# Patient Record
Sex: Female | Born: 1935 | Race: Black or African American | Hispanic: No | Smoking: Never smoker
Health system: Southern US, Community
[De-identification: ages and names within clinical notes are randomized; demographics above are authoritative.]

## PROBLEM LIST (undated history)

## (undated) DIAGNOSIS — G610 Guillain-Barre syndrome: Secondary | ICD-10-CM

## (undated) DIAGNOSIS — G6189 Other inflammatory polyneuropathies: Secondary | ICD-10-CM

## (undated) DIAGNOSIS — E559 Vitamin D deficiency, unspecified: Secondary | ICD-10-CM

## (undated) DIAGNOSIS — E785 Hyperlipidemia, unspecified: Secondary | ICD-10-CM

## (undated) DIAGNOSIS — I10 Essential (primary) hypertension: Secondary | ICD-10-CM

## (undated) DIAGNOSIS — G622 Polyneuropathy due to other toxic agents: Secondary | ICD-10-CM

## (undated) DIAGNOSIS — IMO0001 Reserved for inherently not codable concepts without codable children: Secondary | ICD-10-CM

## (undated) HISTORY — DX: Vitamin D deficiency, unspecified: E55.9

## (undated) HISTORY — DX: Reserved for inherently not codable concepts without codable children: IMO0001

## (undated) HISTORY — DX: Polyneuropathy due to other toxic agents: G62.2

## (undated) HISTORY — DX: Hyperlipidemia, unspecified: E78.5

## (undated) HISTORY — DX: Guillain-Barre syndrome: G61.0

## (undated) HISTORY — DX: Other inflammatory polyneuropathies: G61.89

---

## 1983-09-05 HISTORY — PX: TRACHEAL SURGERY: SHX1096

## 2011-08-14 ENCOUNTER — Other Ambulatory Visit: Payer: Self-pay | Admitting: Internal Medicine

## 2011-08-14 DIAGNOSIS — Z1231 Encounter for screening mammogram for malignant neoplasm of breast: Secondary | ICD-10-CM

## 2011-08-14 DIAGNOSIS — Z780 Asymptomatic menopausal state: Secondary | ICD-10-CM

## 2011-09-06 ENCOUNTER — Ambulatory Visit
Admission: RE | Admit: 2011-09-06 | Discharge: 2011-09-06 | Disposition: A | Discharge status: Home / Self Care | Payer: Medicare Other | Source: Ambulatory Visit | Attending: Internal Medicine | Admitting: Internal Medicine

## 2011-09-06 DIAGNOSIS — Z1231 Encounter for screening mammogram for malignant neoplasm of breast: Secondary | ICD-10-CM

## 2011-09-06 DIAGNOSIS — Z13820 Encounter for screening for osteoporosis: Secondary | ICD-10-CM | POA: Diagnosis not present

## 2011-09-06 DIAGNOSIS — Z780 Asymptomatic menopausal state: Secondary | ICD-10-CM

## 2011-09-14 ENCOUNTER — Other Ambulatory Visit: Payer: Self-pay | Admitting: Internal Medicine

## 2011-09-14 DIAGNOSIS — R928 Other abnormal and inconclusive findings on diagnostic imaging of breast: Secondary | ICD-10-CM

## 2011-09-20 ENCOUNTER — Ambulatory Visit
Admission: RE | Admit: 2011-09-20 | Discharge: 2011-09-20 | Disposition: A | Discharge status: Home / Self Care | Payer: Medicare Other | Source: Ambulatory Visit | Attending: Internal Medicine | Admitting: Internal Medicine

## 2011-09-20 DIAGNOSIS — R920 Mammographic microcalcification found on diagnostic imaging of breast: Secondary | ICD-10-CM | POA: Diagnosis not present

## 2011-09-20 DIAGNOSIS — R928 Other abnormal and inconclusive findings on diagnostic imaging of breast: Secondary | ICD-10-CM

## 2011-10-17 ENCOUNTER — Emergency Department (INDEPENDENT_AMBULATORY_CARE_PROVIDER_SITE_OTHER): Payer: Medicare Other

## 2011-10-17 ENCOUNTER — Emergency Department (INDEPENDENT_AMBULATORY_CARE_PROVIDER_SITE_OTHER)
Admission: EM | Admit: 2011-10-17 | Discharge: 2011-10-17 | Disposition: A | Discharge status: Home / Self Care | Payer: Medicare Other | Source: Home / Self Care | Attending: Emergency Medicine | Admitting: Emergency Medicine

## 2011-10-17 ENCOUNTER — Encounter (HOSPITAL_COMMUNITY): Payer: Self-pay | Admitting: *Deleted

## 2011-10-17 DIAGNOSIS — M79606 Pain in leg, unspecified: Secondary | ICD-10-CM

## 2011-10-17 DIAGNOSIS — M79609 Pain in unspecified limb: Secondary | ICD-10-CM | POA: Diagnosis not present

## 2011-10-17 HISTORY — DX: Essential (primary) hypertension: I10

## 2011-10-17 MED ORDER — TRAMADOL HCL 50 MG PO TABS: 50.0000 mg | ORAL_TABLET | Freq: Four times a day (QID) | ORAL | Status: AC | PRN

## 2011-10-17 NOTE — ED Notes (Signed)
2 weeks ago pt reports she was standing on her bed closing a vent and "plopped" down instead of slowly getting down.  Since then she has had left  Medial  upper thigh pain

## 2011-10-17 NOTE — ED Provider Notes (Signed)
History     CSN: [id number]  Arrival date & time 10/17/11  1125   First MD Initiated Contact with Patient 10/17/11 1330      Chief Complaint  Patient presents with  . Leg Pain    (Consider location/radiation/quality/duration/timing/severity/associated sxs/prior treatment) HPI Comments: Is patient reports fell about 2 weeks ago while she was standing on her bed attempting to close a bent fell down on her bed twisting her right upper leg. Been having pain since then predominantly when walking. Denies any numbness or weakness able to walk but limping.  Have been massaging her leg and taking some Tylenol and Advil over-the-counter.  Patient is a 77 y.o. female presenting with leg pain. The history is provided by the patient.  Leg Pain  The incident occurred more than 1 week ago. The incident occurred at home. The injury mechanism was torsion. The pain is at a severity of 6/10. The pain is moderate. The pain has been constant since onset. Pertinent negatives include no numbness, no loss of motion, no loss of sensation and no tingling. She has tried nothing for the symptoms. The treatment provided no relief.    Past Medical History  Diagnosis Date  . Hypertension   . Diabetes mellitus     History reviewed. No pertinent past surgical history.  Family History  Problem Relation Age of Onset  . Heart failure Mother   . Alzheimer's disease Sister     History  Substance Use Topics  . Smoking status: Never Smoker   . Smokeless tobacco: Not on file  . Alcohol Use: No    OB History    Grav Para Term Preterm Abortions TAB SAB Ect Mult Living                  Review of Systems  Constitutional: Negative for fever, fatigue and unexpected weight change.  HENT: Negative for tinnitus.   Musculoskeletal: Negative for back pain, joint swelling and gait problem.  Neurological: Negative for tingling, tremors, seizures, speech difficulty, weakness, light-headedness and numbness.     Allergies  Review of patient's allergies indicates no known allergies.  Home Medications   Current Outpatient Rx  Name Route Sig Dispense Refill  . ASPIRIN 81 MG PO TABS Oral Take 81 mg by mouth daily.    Marland Kitchen LISINOPRIL 40 MG PO TABS Oral Take 40 mg by mouth daily.    Marland Kitchen METFORMIN HCL 500 MG PO TABS Oral Take 500 mg by mouth 1 day or 1 dose.      BP 165/80  Pulse 76  Temp(Src) 98.6 F (37 C) (Oral)  Resp 18  SpO2 99%  Physical Exam  Constitutional: She appears well-developed and well-nourished. No distress.  HENT:  Head: Normocephalic.  Eyes: Conjunctivae are normal. Right eye exhibits no discharge. Left eye exhibits no discharge.  Abdominal: There is no tenderness.  Musculoskeletal: She exhibits tenderness. She exhibits no edema.       Left hip: She exhibits decreased range of motion and tenderness. She exhibits normal strength, no bony tenderness, no swelling, no crepitus, no deformity and no laceration.       Legs: Skin: No rash noted. No erythema.    ED Course  Procedures (including critical care time)  Labs Reviewed - No data to display No results found.   No diagnosis found.    MDM          Jimmie Molly, MD 10/17/11 1356

## 2011-11-07 ENCOUNTER — Emergency Department (INDEPENDENT_AMBULATORY_CARE_PROVIDER_SITE_OTHER): Payer: Medicare Other

## 2011-11-07 ENCOUNTER — Encounter (HOSPITAL_COMMUNITY): Payer: Self-pay | Admitting: Emergency Medicine

## 2011-11-07 ENCOUNTER — Emergency Department (INDEPENDENT_AMBULATORY_CARE_PROVIDER_SITE_OTHER)
Admission: EM | Admit: 2011-11-07 | Discharge: 2011-11-07 | Disposition: A | Discharge status: Home Health | Payer: Medicare Other | Source: Home / Self Care | Attending: Family Medicine | Admitting: Family Medicine

## 2011-11-07 DIAGNOSIS — R5381 Other malaise: Secondary | ICD-10-CM | POA: Diagnosis not present

## 2011-11-07 DIAGNOSIS — R059 Cough, unspecified: Secondary | ICD-10-CM | POA: Diagnosis not present

## 2011-11-07 DIAGNOSIS — R05 Cough: Secondary | ICD-10-CM | POA: Diagnosis not present

## 2011-11-07 DIAGNOSIS — J40 Bronchitis, not specified as acute or chronic: Secondary | ICD-10-CM | POA: Diagnosis not present

## 2011-11-07 MED ORDER — AZITHROMYCIN 250 MG PO TABS: 250.0000 mg | ORAL_TABLET | Freq: Every day | ORAL | Status: AC

## 2011-11-07 NOTE — Discharge Instructions (Signed)
Take antibiotics as directed. I recommend aggressive pain and fever control with acetaminophen (Tylenol) and/or ibuprofen. You may use these together, alternating them every 4 hours, or individually, every 8 hours. For example, take acetaminophen 500 to 1000 mg at 12 noon, then 600 to 800 mg of ibuprofen at 4 pm, then acetaminophen at 8 pm, etc. Also, stay hydrated with clear liquids. Return to care should your symptoms not improve, or worsen in any way.    

## 2011-11-07 NOTE — ED Provider Notes (Signed)
History     CSN: [id number]  Arrival date & time 11/07/11  1013   First MD Initiated Contact with Patient 11/07/11 1048      Chief Complaint  Patient presents with  . URI    (Consider location/radiation/quality/duration/timing/severity/associated sxs/prior treatment) HPI Comments: Jocelyn Hendrix presents for evaluation of nasal congestion, rhinorrhea, productive cough, body aches, low-grade fever, and poor appetite since Saturday. She has not taken anything for this. Nor has she taking her regular medications secondary to "being too weak."  Patient is a 77 y.o. female presenting with cough. The history is provided by the patient and a relative.  Cough This is a new problem. The current episode started more than 2 days ago. The problem occurs constantly. The cough is productive of sputum. The maximum temperature recorded prior to her arrival was 100 to 100.9 F. Associated symptoms include chills, rhinorrhea and myalgias. Pertinent negatives include no sore throat. She has tried nothing for the symptoms. She is not a smoker.    Past Medical History  Diagnosis Date  . Hypertension   . Diabetes mellitus     History reviewed. No pertinent past surgical history.  Family History  Problem Relation Age of Onset  . Heart failure Mother   . Alzheimer's disease Sister     History  Substance Use Topics  . Smoking status: Never Smoker   . Smokeless tobacco: Not on file  . Alcohol Use: No    OB History    Grav Para Term Preterm Abortions TAB SAB Ect Mult Living                  Review of Systems  Constitutional: Positive for fever, chills and fatigue.  HENT: Positive for congestion and rhinorrhea. Negative for sore throat.   Eyes: Negative.   Respiratory: Positive for cough.   Cardiovascular: Negative.   Gastrointestinal: Negative.   Genitourinary: Negative.   Musculoskeletal: Positive for myalgias.  Skin: Negative.   Neurological: Negative.     Allergies  Review of  patient's allergies indicates no known allergies.  Home Medications   Current Outpatient Rx  Name Route Sig Dispense Refill  . SIMVASTATIN PO Oral Take by mouth.    . TRAMADOL HCL PO Oral Take by mouth.    . ASPIRIN 81 MG PO TABS Oral Take 81 mg by mouth daily.    . AZITHROMYCIN 250 MG PO TABS Oral Take 1 tablet (250 mg total) by mouth daily. Take two tablets on first day, then one tablet each day for four days 6 tablet 0  . LISINOPRIL 40 MG PO TABS Oral Take 40 mg by mouth daily.    Marland Kitchen METFORMIN HCL 500 MG PO TABS Oral Take 500 mg by mouth 1 day or 1 dose.      BP 133/72  Pulse 113  Temp(Src) 100.3 F (37.9 C) (Oral)  Resp 16  SpO2 96%  Physical Exam  Nursing note and vitals reviewed. Constitutional: She is oriented to person, place, and time. She appears well-developed and well-nourished.  HENT:  Head: Normocephalic and atraumatic.  Eyes: EOM are normal.  Neck: Normal range of motion.  Cardiovascular: Regular rhythm.  Tachycardia present.   Pulmonary/Chest: Effort normal. She has no decreased breath sounds. She has wheezes in the right upper field, the right middle field and the right lower field. She has rhonchi in the left upper field and the left middle field.  Musculoskeletal: Normal range of motion.  Neurological: She is alert and oriented to person,  place, and time.  Skin: Skin is warm and dry.  Psychiatric: Her behavior is normal.    ED Course  Procedures (including critical care time)  Labs Reviewed - No data to display Dg Chest 2 View  11/07/2011  *RADIOLOGY REPORT*  Clinical Data: 76 year old female with weakness and productive cough.  CHEST - 2 VIEW  Comparison: None.  Findings: Lung volumes are within normal limits.  Mild eventration of the diaphragm.  Cardiac size and mediastinal contours are within normal limits.  Visualized tracheal air column is within normal limits.  Central peribronchial thickening greater on the right.  No pleural fluid or consolidation.   No pneumothorax or edema. Degenerative changes in the spine and scoliosis. No acute osseous abnormality identified.  IMPRESSION: Central peribronchial thickening suggestive of airway infection in this setting.  No focal pneumonia identified.  Original Report Authenticated By: Harley Hallmark, M.D.     1. Bronchitis       MDM  Xray reviewed by radiologist and myself; no pneumonia identified; likely viral but will cover with abx; advised supportive care        Richardo Priest, MD 11/07/11 1315

## 2011-11-07 NOTE — ED Notes (Signed)
Pt here with cold symptoms that began on 11/04/11. Denies taking any OTC medications, but is drinking fluids to treat symptoms.

## 2011-11-09 DIAGNOSIS — J209 Acute bronchitis, unspecified: Secondary | ICD-10-CM | POA: Diagnosis not present

## 2011-11-09 DIAGNOSIS — IMO0001 Reserved for inherently not codable concepts without codable children: Secondary | ICD-10-CM | POA: Diagnosis not present

## 2011-11-09 DIAGNOSIS — I10 Essential (primary) hypertension: Secondary | ICD-10-CM | POA: Diagnosis not present

## 2011-11-09 DIAGNOSIS — E559 Vitamin D deficiency, unspecified: Secondary | ICD-10-CM | POA: Diagnosis not present

## 2011-11-09 DIAGNOSIS — E119 Type 2 diabetes mellitus without complications: Secondary | ICD-10-CM | POA: Diagnosis not present

## 2011-11-13 DIAGNOSIS — IMO0002 Reserved for concepts with insufficient information to code with codable children: Secondary | ICD-10-CM | POA: Diagnosis not present

## 2011-11-13 DIAGNOSIS — G6189 Other inflammatory polyneuropathies: Secondary | ICD-10-CM | POA: Diagnosis not present

## 2011-11-13 DIAGNOSIS — IMO0001 Reserved for inherently not codable concepts without codable children: Secondary | ICD-10-CM | POA: Diagnosis not present

## 2011-11-13 DIAGNOSIS — J209 Acute bronchitis, unspecified: Secondary | ICD-10-CM | POA: Diagnosis not present

## 2011-11-20 ENCOUNTER — Ambulatory Visit: Payer: Medicare Other | Attending: Internal Medicine | Admitting: Physical Therapy

## 2011-11-20 DIAGNOSIS — M25569 Pain in unspecified knee: Secondary | ICD-10-CM | POA: Diagnosis not present

## 2011-11-20 DIAGNOSIS — M545 Low back pain, unspecified: Secondary | ICD-10-CM | POA: Insufficient documentation

## 2011-11-20 DIAGNOSIS — M25559 Pain in unspecified hip: Secondary | ICD-10-CM | POA: Diagnosis not present

## 2011-11-20 DIAGNOSIS — R262 Difficulty in walking, not elsewhere classified: Secondary | ICD-10-CM | POA: Diagnosis not present

## 2011-11-20 DIAGNOSIS — IMO0001 Reserved for inherently not codable concepts without codable children: Secondary | ICD-10-CM | POA: Diagnosis not present

## 2011-11-27 ENCOUNTER — Ambulatory Visit: Payer: Medicare Other | Admitting: Physical Therapy

## 2011-11-27 DIAGNOSIS — M545 Low back pain, unspecified: Secondary | ICD-10-CM | POA: Diagnosis not present

## 2011-11-27 DIAGNOSIS — R262 Difficulty in walking, not elsewhere classified: Secondary | ICD-10-CM | POA: Diagnosis not present

## 2011-11-27 DIAGNOSIS — M25559 Pain in unspecified hip: Secondary | ICD-10-CM | POA: Diagnosis not present

## 2011-11-27 DIAGNOSIS — IMO0001 Reserved for inherently not codable concepts without codable children: Secondary | ICD-10-CM | POA: Diagnosis not present

## 2011-11-27 DIAGNOSIS — M25569 Pain in unspecified knee: Secondary | ICD-10-CM | POA: Diagnosis not present

## 2011-12-04 ENCOUNTER — Ambulatory Visit: Payer: Medicare Other | Attending: Internal Medicine | Admitting: Physical Therapy

## 2011-12-04 DIAGNOSIS — M25569 Pain in unspecified knee: Secondary | ICD-10-CM | POA: Insufficient documentation

## 2011-12-04 DIAGNOSIS — IMO0001 Reserved for inherently not codable concepts without codable children: Secondary | ICD-10-CM | POA: Insufficient documentation

## 2011-12-04 DIAGNOSIS — M25559 Pain in unspecified hip: Secondary | ICD-10-CM | POA: Diagnosis not present

## 2011-12-04 DIAGNOSIS — M545 Low back pain, unspecified: Secondary | ICD-10-CM | POA: Insufficient documentation

## 2011-12-04 DIAGNOSIS — R262 Difficulty in walking, not elsewhere classified: Secondary | ICD-10-CM | POA: Insufficient documentation

## 2011-12-11 ENCOUNTER — Ambulatory Visit: Payer: Medicare Other | Admitting: Physical Therapy

## 2011-12-25 DIAGNOSIS — E119 Type 2 diabetes mellitus without complications: Secondary | ICD-10-CM | POA: Diagnosis not present

## 2011-12-25 DIAGNOSIS — IMO0001 Reserved for inherently not codable concepts without codable children: Secondary | ICD-10-CM | POA: Diagnosis not present

## 2011-12-25 DIAGNOSIS — I10 Essential (primary) hypertension: Secondary | ICD-10-CM | POA: Diagnosis not present

## 2011-12-25 DIAGNOSIS — IMO0002 Reserved for concepts with insufficient information to code with codable children: Secondary | ICD-10-CM | POA: Diagnosis not present

## 2012-04-04 DIAGNOSIS — I10 Essential (primary) hypertension: Secondary | ICD-10-CM | POA: Diagnosis not present

## 2012-04-04 DIAGNOSIS — E119 Type 2 diabetes mellitus without complications: Secondary | ICD-10-CM | POA: Diagnosis not present

## 2012-04-08 DIAGNOSIS — E785 Hyperlipidemia, unspecified: Secondary | ICD-10-CM | POA: Diagnosis not present

## 2012-04-08 DIAGNOSIS — G6189 Other inflammatory polyneuropathies: Secondary | ICD-10-CM | POA: Diagnosis not present

## 2012-04-08 DIAGNOSIS — E119 Type 2 diabetes mellitus without complications: Secondary | ICD-10-CM | POA: Diagnosis not present

## 2012-04-08 DIAGNOSIS — I10 Essential (primary) hypertension: Secondary | ICD-10-CM | POA: Diagnosis not present

## 2012-05-01 IMAGING — CR DG FEMUR 2V*L*
4 series · 4 of 4 positions shown · non-contrast
Comparison: None.

CLINICAL DATA: 76-year-old female left femur pain.

LEFT FEMUR - 2 VIEW

[view not recorded (1 of 4)]
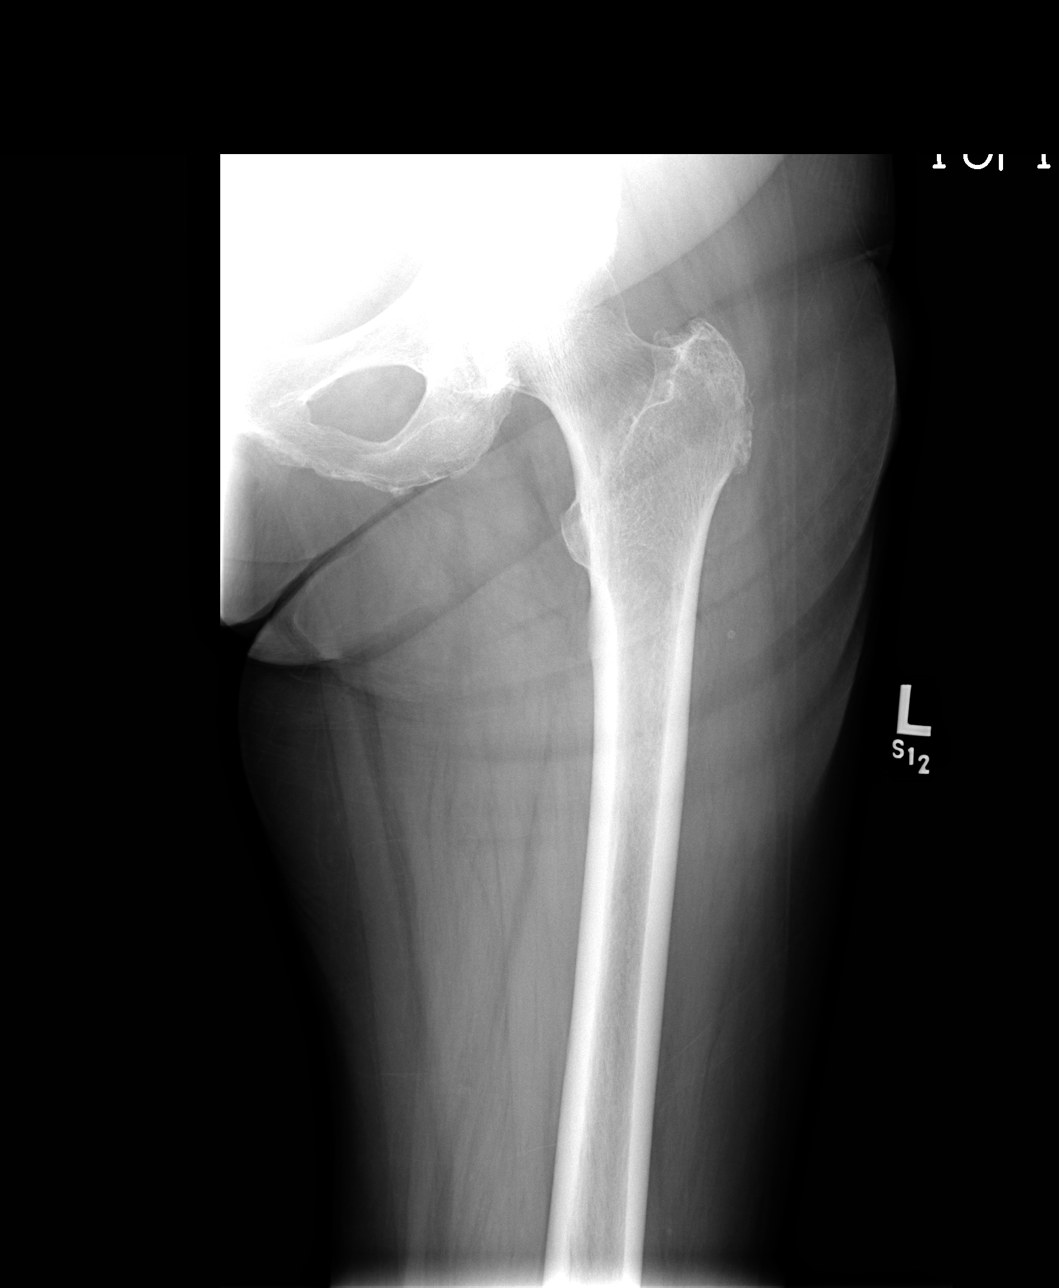

[view not recorded (2 of 4)]
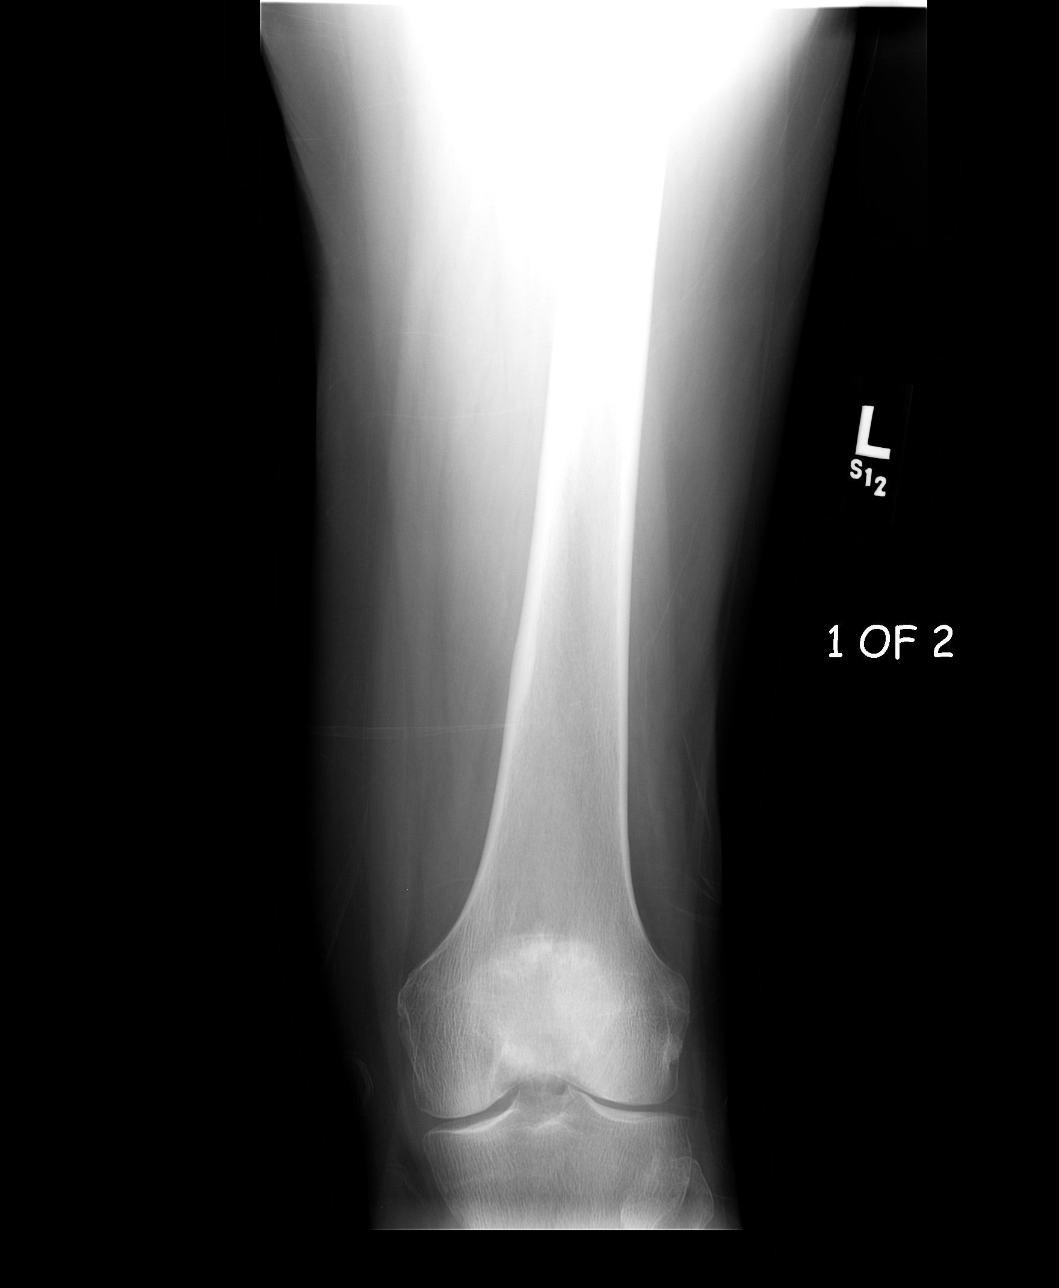

[view not recorded (3 of 4)]
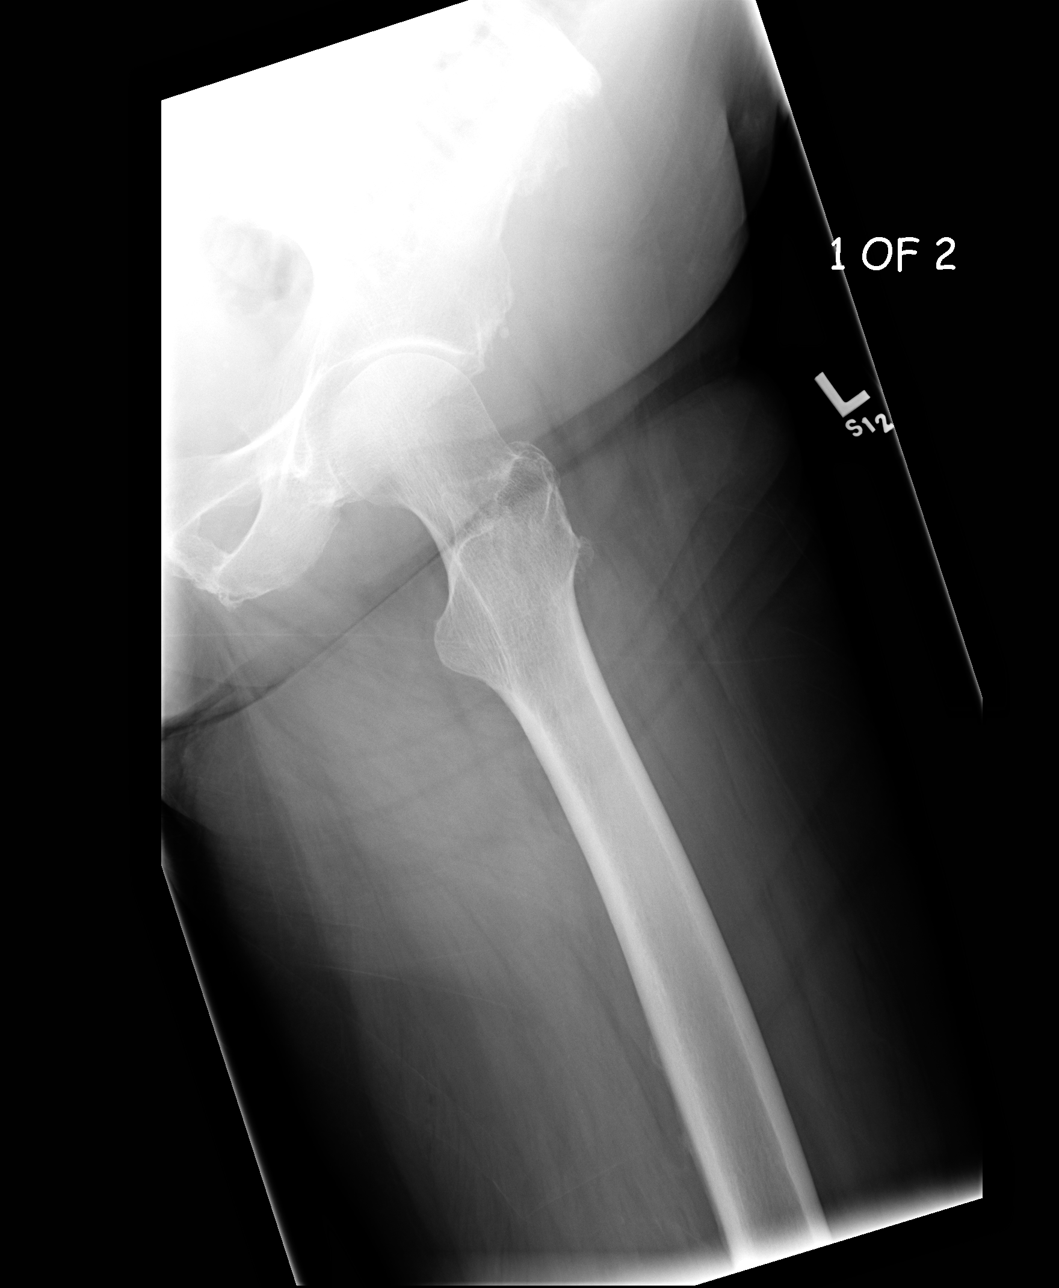

[view not recorded (4 of 4)]
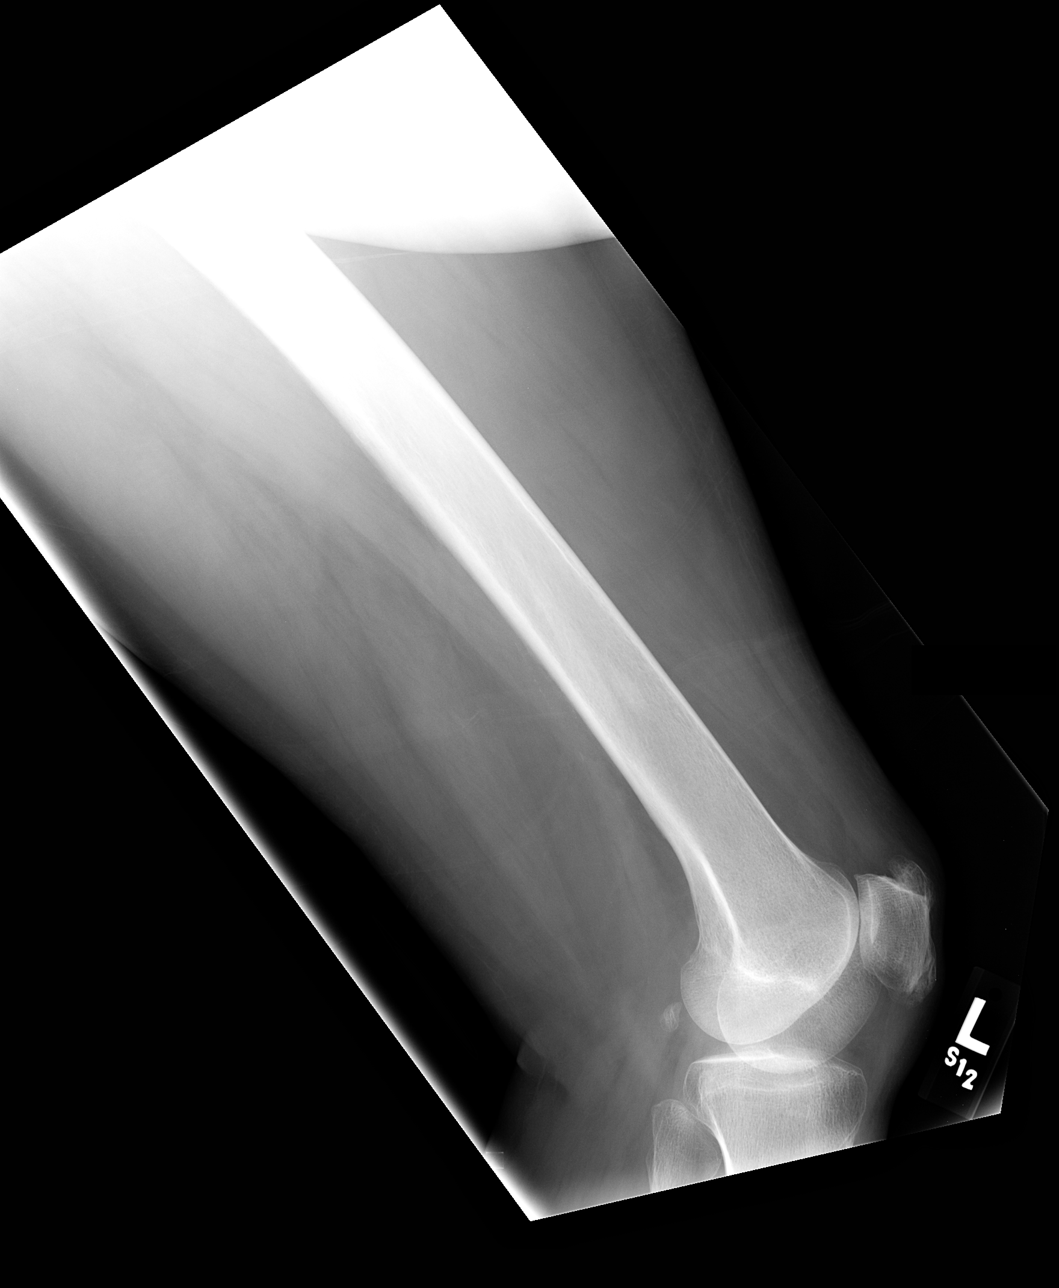

[4 of 4 positions shown; findings below may reference images not displayed]

FINDINGS: Left femoral head is normally located.  Proximal left
femur appears intact.  Distal left femur appears intact.
Suggestion of left knee medial compartment joint space loss. Bone
mineralization is within normal limits for age.   Visualized left
hemi pelvis appears intact.
IMPRESSION: No acute fracture or dislocation identified about the left femur.

## 2012-07-31 DIAGNOSIS — Z0000 Encounter for general adult medical examination without abnormal findings: Secondary | ICD-10-CM | POA: Diagnosis not present

## 2012-07-31 DIAGNOSIS — I10 Essential (primary) hypertension: Secondary | ICD-10-CM | POA: Diagnosis not present

## 2012-07-31 DIAGNOSIS — E119 Type 2 diabetes mellitus without complications: Secondary | ICD-10-CM | POA: Diagnosis not present

## 2012-07-31 DIAGNOSIS — E559 Vitamin D deficiency, unspecified: Secondary | ICD-10-CM | POA: Diagnosis not present

## 2012-07-31 DIAGNOSIS — E785 Hyperlipidemia, unspecified: Secondary | ICD-10-CM | POA: Diagnosis not present

## 2012-08-05 DIAGNOSIS — I10 Essential (primary) hypertension: Secondary | ICD-10-CM | POA: Diagnosis not present

## 2012-08-05 DIAGNOSIS — E785 Hyperlipidemia, unspecified: Secondary | ICD-10-CM | POA: Diagnosis not present

## 2012-08-05 DIAGNOSIS — G6189 Other inflammatory polyneuropathies: Secondary | ICD-10-CM | POA: Diagnosis not present

## 2012-08-05 DIAGNOSIS — G622 Polyneuropathy due to other toxic agents: Secondary | ICD-10-CM | POA: Diagnosis not present

## 2012-08-05 DIAGNOSIS — E119 Type 2 diabetes mellitus without complications: Secondary | ICD-10-CM | POA: Diagnosis not present

## 2012-10-28 ENCOUNTER — Other Ambulatory Visit: Payer: Self-pay | Admitting: Internal Medicine

## 2012-10-28 DIAGNOSIS — R202 Paresthesia of skin: Secondary | ICD-10-CM

## 2012-10-28 DIAGNOSIS — R921 Mammographic calcification found on diagnostic imaging of breast: Secondary | ICD-10-CM

## 2012-11-11 ENCOUNTER — Ambulatory Visit
Admission: RE | Admit: 2012-11-11 | Discharge: 2012-11-11 | Disposition: A | Discharge status: Home / Self Care | Payer: Medicare Other | Source: Ambulatory Visit | Attending: Internal Medicine | Admitting: Internal Medicine

## 2012-11-11 DIAGNOSIS — R928 Other abnormal and inconclusive findings on diagnostic imaging of breast: Secondary | ICD-10-CM | POA: Diagnosis not present

## 2012-11-11 DIAGNOSIS — R202 Paresthesia of skin: Secondary | ICD-10-CM

## 2012-11-11 DIAGNOSIS — R921 Mammographic calcification found on diagnostic imaging of breast: Secondary | ICD-10-CM

## 2012-12-03 ENCOUNTER — Other Ambulatory Visit: Payer: Medicare Other

## 2012-12-03 ENCOUNTER — Other Ambulatory Visit: Payer: Self-pay | Admitting: Geriatric Medicine

## 2012-12-03 DIAGNOSIS — E119 Type 2 diabetes mellitus without complications: Secondary | ICD-10-CM

## 2012-12-03 DIAGNOSIS — I10 Essential (primary) hypertension: Secondary | ICD-10-CM | POA: Diagnosis not present

## 2012-12-03 DIAGNOSIS — E785 Hyperlipidemia, unspecified: Secondary | ICD-10-CM | POA: Diagnosis not present

## 2012-12-04 LAB — HEMOGLOBIN A1C
Est. average glucose Bld gHb Est-mCnc: 146 mg/dL
Hgb A1c MFr Bld: 6.7 % — ABNORMAL HIGH (ref 4.8–5.6)

## 2012-12-04 LAB — COMPREHENSIVE METABOLIC PANEL
ALT: 17 IU/L (ref 0–32)
AST: 27 IU/L (ref 0–40)
Albumin/Globulin Ratio: 1.4 (ref 1.1–2.5)
Albumin: 4.1 g/dL (ref 3.5–4.8)
Alkaline Phosphatase: 74 IU/L (ref 39–117)
BUN/Creatinine Ratio: 24 (ref 11–26)
BUN: 19 mg/dL (ref 8–27)
CO2: 26 mmol/L (ref 19–28)
Calcium: 9.6 mg/dL (ref 8.6–10.2)
Chloride: 100 mmol/L (ref 97–108)
Creatinine, Ser: 0.78 mg/dL (ref 0.57–1.00)
GFR calc Af Amer: 86 mL/min/{1.73_m2} (ref >59)
GFR calc non Af Amer: 75 mL/min/{1.73_m2} (ref >59)
Globulin, Total: 2.9 g/dL (ref 1.5–4.5)
Glucose: 109 mg/dL — ABNORMAL HIGH (ref 65–99)
Potassium: 4.2 mmol/L (ref 3.5–5.2)
Sodium: 139 mmol/L (ref 134–144)
Total Bilirubin: 0.2 mg/dL (ref 0.0–1.2)
Total Protein: 7 g/dL (ref 6.0–8.5)

## 2012-12-04 LAB — LIPID PANEL
Chol/HDL Ratio: 3.8 ratio units (ref 0.0–4.4)
Cholesterol, Total: 211 mg/dL — ABNORMAL HIGH (ref 100–199)
HDL: 56 mg/dL (ref >39)
LDL Calculated: 127 mg/dL — ABNORMAL HIGH (ref 0–99)
Triglycerides: 142 mg/dL (ref 0–149)
VLDL Cholesterol Cal: 28 mg/dL (ref 5–40)

## 2012-12-05 ENCOUNTER — Encounter: Payer: Self-pay | Admitting: Internal Medicine

## 2012-12-05 ENCOUNTER — Ambulatory Visit (INDEPENDENT_AMBULATORY_CARE_PROVIDER_SITE_OTHER): Payer: Medicare Other | Admitting: Internal Medicine

## 2012-12-05 VITALS — BP 122/72 | HR 68 | Temp 98.1°F | Resp 14 | Wt 147.0 lb

## 2012-12-05 DIAGNOSIS — E119 Type 2 diabetes mellitus without complications: Secondary | ICD-10-CM | POA: Diagnosis not present

## 2012-12-05 DIAGNOSIS — E785 Hyperlipidemia, unspecified: Secondary | ICD-10-CM

## 2012-12-05 DIAGNOSIS — I10 Essential (primary) hypertension: Secondary | ICD-10-CM | POA: Diagnosis not present

## 2012-12-05 DIAGNOSIS — E1169 Type 2 diabetes mellitus with other specified complication: Secondary | ICD-10-CM | POA: Insufficient documentation

## 2012-12-05 NOTE — Assessment & Plan Note (Addendum)
Is tolerating metformin 500mg  once daily.  Eats lightly.  Sugar was 87 this am so ate a little more and had coffee.  Checks sugar every morning before eating.  Typically run from 87-112.  One time 121.  Mostly run in 90s.

## 2012-12-05 NOTE — Progress Notes (Signed)
Patient ID: Jocelyn Hendrix, female   DOB: September 26, 1934, 78 y.o.   MRN: [id number] Code Status: need to discuss next visit.    No Known Allergies  Chief Complaint  Patient presents with  . Medical Managment of Chronic Issues    no issues today    HPI: Patient is a 78 y.o. AA female seen in the office today for management of her chronic conditions including DMII, htn, hyperlipidemia here for routine f/u on chronic conditions.  She continues to walk regularly and eat a healthy, balanced diet.  She has no new concerns today.    Review of Systems:  Review of Systems  Constitutional: Negative for fever and chills.  Respiratory: Negative for shortness of breath.   Cardiovascular: Negative for chest pain.  Gastrointestinal: Negative for heartburn, nausea, vomiting, abdominal pain, diarrhea and constipation.  Genitourinary: Negative for dysuria, urgency and frequency.  Musculoskeletal: Negative for myalgias.  Skin: Negative for rash.  Neurological: Negative for sensory change and weakness.  Endo/Heme/Allergies: Does not bruise/bleed easily.  Psychiatric/Behavioral: Negative for depression and memory loss. The patient does not have insomnia.      Past Medical History  Diagnosis Date  . Hypertension   . Diabetes mellitus   . Hyperlipidemia    Past Surgical History  Procedure Laterality Date  . 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 She does not have any smokeless tobacco history on file. She reports that she does not drink alcohol or use illicit drugs.  Family History  Problem Relation Age of Onset  . Heart failure Mother   . Diabetes Mother   . Alzheimer's disease Sister     Medications: Patient's Medications  New Prescriptions   No medications on file  Previous Medications   ASPIRIN 81 MG TABLET    Take 81 mg by mouth daily.   LISINOPRIL (PRINIVIL,ZESTRIL) 40 MG TABLET    Take 40 mg  by mouth daily.   METFORMIN (GLUCOPHAGE) 500 MG TABLET    Take 500 mg by mouth 1 day or 1 dose.  Modified Medications   No medications on file  Discontinued Medications   SIMVASTATIN PO    Take by mouth.   TRAMADOL HCL PO    Take by mouth.     Physical Exam:  Filed Vitals:   12/05/12 0827  BP: 122/72  Pulse: 68  Temp: 98.1 F (36.7 C)  TempSrc: Oral  Resp: 14  Weight: 147 lb (66.679 kg)  SpO2: 99%   Physical Exam  Constitutional: She is oriented to person, place, and time. She appears well-developed and well-nourished. No distress.  HENT:  Head: Normocephalic and atraumatic.  Eyes: Pupils are equal, round, and reactive to light.  Wears glasses  Cardiovascular: Normal rate, regular rhythm, normal heart sounds and intact distal pulses.  Exam reveals no gallop and no friction rub.   No murmur heard. Pulmonary/Chest: Effort normal and breath sounds normal. She has no wheezes.  Abdominal: Soft. Bowel sounds are normal. She exhibits no distension and no mass. There is no tenderness.  Musculoskeletal: Normal range of motion. She exhibits no tenderness.  Neurological: She is alert and oriented to person, place, and time. No cranial nerve deficit.  Skin: Skin is warm and dry. She is not diaphoretic.  Psychiatric: She has a normal mood and affect. Her behavior is normal. Judgment and thought content normal.      Labs reviewed:  Basic Metabolic Panel:  Recent Labs  16/10/96 0849  NA 139  K 4.2  CL 100  CO2 26  GLUCOSE 109*  BUN 19  CREATININE 0.78  CALCIUM 9.6   Liver Function Tests:  Recent Labs  12/03/12 0849  AST 27  ALT 17  ALKPHOS 74  BILITOT 0.2  PROT 7.0   Lipid Panel:  Recent Labs  12/03/12 0849  HDL 56  LDLCALC 127*  TRIG 142  CHOLHDL 3.8   04/04/2012 BMP; Glucose 128, BUN 18, Creatinine 0.84 A1c 7.1 07/31/12 CMP; Glucose 112, BUN 13, Creatinine 0.82 A1c 6.7  Past Procedures: 2000?-Mammogram 09/06/11 Mammogram - Calcifications, right  breast. Additional evaluation is indicated.The patient will be contacted. No specific mammographic evidence of malignancy, left breast. 09/06/11 Bone Density - Low bone mass 2/14 Mammogram:  IMPRESSION:  Stable likely benign right breast calcifications. Follow-up in 1  year recommended to ensure 2-year stability.  No mammographic evidence of breast malignancy otherwise.  BI-RADS CATEGORY 3: Probably benign finding(s) - short interval  follow-up suggested.  RECOMMENDATION:  Bilateral diagnostic mammograms with magnification views of the  right breast in 1 year.   Assessment/Plan Type II or unspecified type diabetes mellitus without mention of complication, not stated as uncontrolled Is tolerating metformin 500mg  once daily.  Eats lightly.  Sugar was 87 this am so ate a little more and had coffee.  Checks sugar every morning before eating.  Typically run from 87-112.  One time 121.  Mostly run in 90s.    Hyperlipidemia LDL goal < 100 Discussed watching sweets and starches to lower LDL and increasing fish intake to increase HDL a little more b/c it is near protective level.    Essential hypertension, benign bp is at goal with lisinopril alone.  Walks regularly and eats light, balanced diet.   Labs/tests ordered:  Bmp, hba1c, flp before 3 mo f/u

## 2012-12-05 NOTE — Assessment & Plan Note (Signed)
bp is at goal with lisinopril alone.  Walks regularly and eats light, balanced diet.

## 2012-12-05 NOTE — Assessment & Plan Note (Signed)
Discussed watching sweets and starches to lower LDL and increasing fish intake to increase HDL a little more b/c it is near protective level.

## 2012-12-23 ENCOUNTER — Other Ambulatory Visit: Payer: Self-pay | Admitting: *Deleted

## 2012-12-23 MED ORDER — METFORMIN HCL 500 MG PO TABS: ORAL_TABLET | ORAL | Status: DC

## 2013-03-04 DIAGNOSIS — E119 Type 2 diabetes mellitus without complications: Secondary | ICD-10-CM | POA: Diagnosis not present

## 2013-03-04 DIAGNOSIS — H2510 Age-related nuclear cataract, unspecified eye: Secondary | ICD-10-CM | POA: Diagnosis not present

## 2013-03-19 ENCOUNTER — Other Ambulatory Visit: Payer: Medicare Other

## 2013-03-19 DIAGNOSIS — E119 Type 2 diabetes mellitus without complications: Secondary | ICD-10-CM | POA: Diagnosis not present

## 2013-03-19 DIAGNOSIS — I10 Essential (primary) hypertension: Secondary | ICD-10-CM | POA: Diagnosis not present

## 2013-03-19 DIAGNOSIS — E785 Hyperlipidemia, unspecified: Secondary | ICD-10-CM

## 2013-03-20 ENCOUNTER — Other Ambulatory Visit: Payer: Medicare Other

## 2013-03-20 LAB — LIPID PANEL
Chol/HDL Ratio: 3.7 ratio units (ref 0.0–4.4)
Cholesterol, Total: 229 mg/dL — ABNORMAL HIGH (ref 100–199)
HDL: 62 mg/dL (ref >39)
LDL Calculated: 133 mg/dL — ABNORMAL HIGH (ref 0–99)
Triglycerides: 169 mg/dL — ABNORMAL HIGH (ref 0–149)
VLDL Cholesterol Cal: 34 mg/dL (ref 5–40)

## 2013-03-20 LAB — BASIC METABOLIC PANEL
BUN/Creatinine Ratio: 17 (ref 11–26)
BUN: 15 mg/dL (ref 8–27)
CO2: 27 mmol/L (ref 18–29)
Calcium: 10.2 mg/dL (ref 8.6–10.2)
Chloride: 101 mmol/L (ref 97–108)
Creatinine, Ser: 0.89 mg/dL (ref 0.57–1.00)
GFR calc Af Amer: 73 mL/min/{1.73_m2} (ref >59)
GFR calc non Af Amer: 64 mL/min/{1.73_m2} (ref >59)
Glucose: 115 mg/dL — ABNORMAL HIGH (ref 65–99)
Potassium: 4.4 mmol/L (ref 3.5–5.2)
Sodium: 140 mmol/L (ref 134–144)

## 2013-03-20 LAB — HEMOGLOBIN A1C
Est. average glucose Bld gHb Est-mCnc: 143 mg/dL
Hgb A1c MFr Bld: 6.6 % — ABNORMAL HIGH (ref 4.8–5.6)

## 2013-03-24 ENCOUNTER — Encounter: Payer: Self-pay | Admitting: Internal Medicine

## 2013-03-24 ENCOUNTER — Ambulatory Visit (INDEPENDENT_AMBULATORY_CARE_PROVIDER_SITE_OTHER): Payer: Medicare Other | Admitting: Internal Medicine

## 2013-03-24 VITALS — BP 142/80 | HR 68 | Temp 97.8°F | Wt 149.6 lb

## 2013-03-24 DIAGNOSIS — E119 Type 2 diabetes mellitus without complications: Secondary | ICD-10-CM

## 2013-03-24 DIAGNOSIS — I10 Essential (primary) hypertension: Secondary | ICD-10-CM

## 2013-03-24 DIAGNOSIS — M545 Low back pain, unspecified: Secondary | ICD-10-CM

## 2013-03-24 DIAGNOSIS — E785 Hyperlipidemia, unspecified: Secondary | ICD-10-CM | POA: Diagnosis not present

## 2013-03-24 NOTE — Assessment & Plan Note (Signed)
BP a little elevated today, but ate things that weren't in her normal diet this weekend.  Will check at home regularly and bring a record to her next appt.

## 2013-03-24 NOTE — Progress Notes (Signed)
Patient ID: Jocelyn Hendrix, female   DOB: [birth year], 78 y.o.   MRN: [id number] Location:  [facility]  No Known Allergies  Chief Complaint  Patient presents with  . Medical Managment of Chronic Issues    HPI: Patient is a 78 y.o. AA female seen in the office today for f/u chronic conditions.  She is doing well.  Has low back pain when she first gets up in the am.  Has 78 yo mattress.  When stretches and walks around some, it gets better.  She went to a [place] party over the weekend and ate a lot of food there that may have had higher sodium content.  Her bp is elevated today.  Has a cuff at home , but has not been checking it.  Is still walking regularly.  Went to ophtho and eyes are the same.  Her cataracts are not ready for extraction.  She needs a dentist.  She wants her discolored fillings taken care of and needs her bridge to stay put.    Review of Systems:  Review of Systems  Constitutional: Negative for fever and chills.  HENT: Negative for congestion.   Eyes: Negative for blurred vision.  Respiratory: Negative for shortness of breath.   Cardiovascular: Negative for chest pain.  Gastrointestinal: Negative for heartburn, nausea, vomiting and constipation.  Genitourinary: Negative for dysuria, urgency and frequency.  Musculoskeletal: Positive for back pain. Negative for myalgias and falls.  Skin: Negative for itching and rash.  Neurological: Negative for dizziness.  Endo/Heme/Allergies: Does not bruise/bleed easily.  Psychiatric/Behavioral: Negative for depression and memory loss. The patient is not nervous/anxious.      Past Medical History  Diagnosis Date  . Hypertension   . Diabetes mellitus   . Hyperlipidemia     Past Surgical History  Procedure Laterality Date  . 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 She does not have any smokeless  tobacco history on file. She reports that she does not drink alcohol or use illicit drugs.  Family History  Problem Relation Age of Onset  . Heart failure Mother   . Diabetes Mother   . Alzheimer's disease Sister     Medications: Patient's Medications  New Prescriptions   No medications on file  Previous Medications   ASPIRIN 81 MG TABLET    Take 81 mg by mouth daily.   LISINOPRIL (PRINIVIL,ZESTRIL) 40 MG TABLET    Take 40 mg by mouth daily.   METFORMIN (GLUCOPHAGE) 500 MG TABLET    Take one tablet by mouth two times daily for diabetes  Modified Medications   No medications on file  Discontinued Medications   No medications on file     Physical Exam: Filed Vitals:   03/24/13 0933  BP: 142/80  Pulse: 68  Temp: 97.8 F (36.6 C)  TempSrc: Oral  Weight: 149 lb 9.6 oz (67.858 kg)  Physical Exam  Constitutional: She is oriented to person, place, and time. She appears well-developed and well-nourished. No distress.  HENT:  Head: Normocephalic and atraumatic.  Cardiovascular: Normal rate, regular rhythm, normal heart sounds and intact distal pulses.   No murmur heard. Pulmonary/Chest: Effort normal and breath sounds normal. No respiratory distress.  Abdominal: Soft. Bowel sounds are normal. She exhibits no distension. There is no tenderness.  Musculoskeletal: Normal range of motion. She exhibits no edema and no  tenderness.  Neurological: She is alert and oriented to person, place, and time.  Skin: Skin is warm and dry.    Labs reviewed: Basic Metabolic Panel:  Recent Labs  08/65/78 0849 03/19/13 0840  NA 139 140  K 4.2 4.4  CL 100 101  CO2 26 27  GLUCOSE 109* 115*  BUN 19 15  CREATININE 0.78 0.89  CALCIUM 9.6 10.2   Liver Function Tests:  Recent Labs  12/03/12 0849  AST 27  ALT 17  ALKPHOS 74  BILITOT 0.2  PROT 7.0  Lipid Panel:  Recent Labs  12/03/12 0849 03/19/13 0840  HDL 56 62  LDLCALC 127* 133*  TRIG 142 169*  CHOLHDL 3.8 3.7   Lab  Results  Component Value Date   HGBA1C 6.6* 03/19/2013   Assessment/Plan Type II or unspecified type diabetes mellitus without mention of complication, not stated as uncontrolled hba1c 6.6--improved from prior.  Continue diet and exercise and metformin.  Hyperlipidemia LDL goal < 100 LDL and TG slightly above goal  With high HDL.  Is on diet and exercise.  Is not a fan of taking a med for her cholesterol.  Essential hypertension, benign BP a little elevated today, but ate things that weren't in her normal diet this weekend.  Will check at home regularly and bring a record to her next appt.  Low back pain:  I suspect this is osteoarthritis as it improved with activity.  Has not been bad enough to take medication.  Continues stretches and walking.   Labs/tests ordered: none Next appt: 4 mos

## 2013-03-24 NOTE — Assessment & Plan Note (Addendum)
hba1c 6.6--improved from prior.  Continue diet and exercise and metformin.

## 2013-03-24 NOTE — Assessment & Plan Note (Signed)
LDL and TG slightly above goal  With high HDL.  Is on diet and exercise.  Is not a fan of taking a med for her cholesterol.

## 2013-04-08 ENCOUNTER — Other Ambulatory Visit: Payer: Self-pay | Admitting: Geriatric Medicine

## 2013-04-08 MED ORDER — LISINOPRIL 40 MG PO TABS: 40.0000 mg | ORAL_TABLET | Freq: Every day | ORAL | Status: DC

## 2013-04-09 ENCOUNTER — Other Ambulatory Visit: Payer: Self-pay | Admitting: Geriatric Medicine

## 2013-04-09 MED ORDER — LISINOPRIL 40 MG PO TABS: 40.0000 mg | ORAL_TABLET | Freq: Every day | ORAL | Status: DC

## 2013-05-26 ENCOUNTER — Other Ambulatory Visit: Payer: Self-pay | Admitting: Internal Medicine

## 2013-06-03 ENCOUNTER — Other Ambulatory Visit: Payer: Self-pay

## 2013-06-03 MED ORDER — ONETOUCH ULTRASOFT LANCETS MISC: Status: DC

## 2013-06-03 NOTE — Telephone Encounter (Signed)
Letter was received from insurance company ([employer]) indicating because patient is non-insulin dependant they will not cover testing of blood sugar more than once daily.  Rx was changed and re-submitted to reflect testing of once daily

## 2013-08-01 ENCOUNTER — Encounter (HOSPITAL_COMMUNITY): Payer: Self-pay | Admitting: Emergency Medicine

## 2013-08-01 ENCOUNTER — Emergency Department (HOSPITAL_COMMUNITY)
Admission: EM | Admit: 2013-08-01 | Discharge: 2013-08-01 | Disposition: A | Discharge status: Home / Self Care | Payer: Medicare Other | Attending: Emergency Medicine | Admitting: Emergency Medicine

## 2013-08-01 ENCOUNTER — Emergency Department (HOSPITAL_COMMUNITY): Payer: Medicare Other

## 2013-08-01 DIAGNOSIS — Z79899 Other long term (current) drug therapy: Secondary | ICD-10-CM | POA: Insufficient documentation

## 2013-08-01 DIAGNOSIS — R630 Anorexia: Secondary | ICD-10-CM | POA: Insufficient documentation

## 2013-08-01 DIAGNOSIS — I10 Essential (primary) hypertension: Secondary | ICD-10-CM | POA: Insufficient documentation

## 2013-08-01 DIAGNOSIS — E785 Hyperlipidemia, unspecified: Secondary | ICD-10-CM | POA: Diagnosis not present

## 2013-08-01 DIAGNOSIS — Z7982 Long term (current) use of aspirin: Secondary | ICD-10-CM | POA: Insufficient documentation

## 2013-08-01 DIAGNOSIS — E119 Type 2 diabetes mellitus without complications: Secondary | ICD-10-CM | POA: Insufficient documentation

## 2013-08-01 DIAGNOSIS — R1032 Left lower quadrant pain: Secondary | ICD-10-CM | POA: Diagnosis not present

## 2013-08-01 DIAGNOSIS — K5732 Diverticulitis of large intestine without perforation or abscess without bleeding: Secondary | ICD-10-CM | POA: Diagnosis not present

## 2013-08-01 DIAGNOSIS — R110 Nausea: Secondary | ICD-10-CM | POA: Insufficient documentation

## 2013-08-01 DIAGNOSIS — K5792 Diverticulitis of intestine, part unspecified, without perforation or abscess without bleeding: Secondary | ICD-10-CM

## 2013-08-01 DIAGNOSIS — K5900 Constipation, unspecified: Secondary | ICD-10-CM | POA: Insufficient documentation

## 2013-08-01 LAB — URINALYSIS, ROUTINE W REFLEX MICROSCOPIC
Bilirubin Urine: NEGATIVE
Glucose, UA: NEGATIVE mg/dL
Hgb urine dipstick: NEGATIVE
Ketones, ur: NEGATIVE mg/dL
Nitrite: NEGATIVE
Protein, ur: NEGATIVE mg/dL
Specific Gravity, Urine: 1.014 (ref 1.005–1.030)
Urobilinogen, UA: 1 mg/dL (ref 0.0–1.0)
pH: 5.5 (ref 5.0–8.0)

## 2013-08-01 LAB — CBC WITH DIFFERENTIAL/PLATELET
Basophils Absolute: 0 K/uL (ref 0.0–0.1)
Basophils Relative: 0 % (ref 0–1)
Eosinophils Absolute: 0.2 10*3/uL (ref 0.0–0.7)
Eosinophils Relative: 2 % (ref 0–5)
HCT: 38.6 % (ref 36.0–46.0)
Hemoglobin: 13 g/dL (ref 12.0–15.0)
Lymphocytes Relative: 24 % (ref 12–46)
Lymphs Abs: 2.6 10*3/uL (ref 0.7–4.0)
MCH: 30.3 pg (ref 26.0–34.0)
MCHC: 33.7 g/dL (ref 30.0–36.0)
MCV: 90 fL (ref 78.0–100.0)
Monocytes Absolute: 1.2 10*3/uL — ABNORMAL HIGH (ref 0.1–1.0)
Monocytes Relative: 11 % (ref 3–12)
Neutro Abs: 6.7 K/uL (ref 1.7–7.7)
Neutrophils Relative %: 62 % (ref 43–77)
Platelets: 159 10*3/uL (ref 150–400)
RBC: 4.29 MIL/uL (ref 3.87–5.11)
RDW: 12.9 % (ref 11.5–15.5)
WBC: 10.7 10*3/uL — ABNORMAL HIGH (ref 4.0–10.5)

## 2013-08-01 LAB — COMPREHENSIVE METABOLIC PANEL WITH GFR
ALT: 11 U/L (ref 0–35)
AST: 21 U/L (ref 0–37)
Albumin: 3.3 g/dL — ABNORMAL LOW (ref 3.5–5.2)
Alkaline Phosphatase: 81 U/L (ref 39–117)
Chloride: 100 meq/L (ref 96–112)
Potassium: 4 meq/L (ref 3.5–5.1)
Total Bilirubin: 0.2 mg/dL — ABNORMAL LOW (ref 0.3–1.2)

## 2013-08-01 LAB — URINE MICROSCOPIC-ADD ON

## 2013-08-01 LAB — COMPREHENSIVE METABOLIC PANEL
BUN: 15 mg/dL (ref 6–23)
CO2: 24 mEq/L (ref 19–32)
Calcium: 9.2 mg/dL (ref 8.4–10.5)
Creatinine, Ser: 0.72 mg/dL (ref 0.50–1.10)
GFR calc Af Amer: >90 mL/min (ref >90)
GFR calc non Af Amer: 81 mL/min — ABNORMAL LOW (ref >90)
Glucose, Bld: 184 mg/dL — ABNORMAL HIGH (ref 70–99)
Sodium: 135 mEq/L (ref 135–145)
Total Protein: 7.7 g/dL (ref 6.0–8.3)

## 2013-08-01 LAB — LIPASE, BLOOD: Lipase: 37 U/L (ref 11–59)

## 2013-08-01 MED ORDER — METRONIDAZOLE IN NACL 5-0.79 MG/ML-% IV SOLN
500.0000 mg | Freq: Once | INTRAVENOUS | Status: AC
  Administered 2013-08-01: 500 mg via INTRAVENOUS
  Filled 2013-08-01: qty 100

## 2013-08-01 MED ORDER — PROMETHAZINE HCL 25 MG PO TABS: 25.0000 mg | ORAL_TABLET | Freq: Four times a day (QID) | ORAL | Status: DC | PRN

## 2013-08-01 MED ORDER — METOCLOPRAMIDE HCL 5 MG/ML IJ SOLN
5.0000 mg | Freq: Once | INTRAMUSCULAR | Status: AC
  Administered 2013-08-01: 5 mg via INTRAVENOUS
  Filled 2013-08-01: qty 2

## 2013-08-01 MED ORDER — HYDROCODONE-ACETAMINOPHEN 5-325 MG PO TABS: ORAL_TABLET | ORAL | Status: DC

## 2013-08-01 MED ORDER — METRONIDAZOLE 500 MG PO TABS: 500.0000 mg | ORAL_TABLET | Freq: Two times a day (BID) | ORAL | Status: DC

## 2013-08-01 MED ORDER — MORPHINE SULFATE 4 MG/ML IJ SOLN
4.0000 mg | Freq: Once | INTRAMUSCULAR | Status: AC
  Administered 2013-08-01: 4 mg via INTRAVENOUS
  Filled 2013-08-01: qty 1

## 2013-08-01 MED ORDER — SODIUM CHLORIDE 0.9 % IV SOLN
Freq: Once | INTRAVENOUS | Status: AC
  Administered 2013-08-01: 17:00:00 via INTRAVENOUS

## 2013-08-01 MED ORDER — IOHEXOL 300 MG/ML  SOLN
100.0000 mL | Freq: Once | INTRAMUSCULAR | Status: AC | PRN
  Administered 2013-08-01: 100 mL via INTRAVENOUS

## 2013-08-01 MED ORDER — CIPROFLOXACIN IN D5W 400 MG/200ML IV SOLN
400.0000 mg | Freq: Once | INTRAVENOUS | Status: AC
  Administered 2013-08-01: 400 mg via INTRAVENOUS
  Filled 2013-08-01: qty 200

## 2013-08-01 MED ORDER — IOHEXOL 300 MG/ML  SOLN
25.0000 mL | INTRAMUSCULAR | Status: AC
  Administered 2013-08-01: 25 mL via ORAL

## 2013-08-01 MED ORDER — CIPROFLOXACIN HCL 500 MG PO TABS: 500.0000 mg | ORAL_TABLET | Freq: Two times a day (BID) | ORAL | Status: DC

## 2013-08-01 NOTE — ED Provider Notes (Signed)
CSN: [id number]     Arrival date & time 08/01/13  1602 History   First MD Initiated Contact with Patient 08/01/13 1615     Chief Complaint  Patient presents with  . Groin Pain   (Consider location/radiation/quality/duration/timing/severity/associated sxs/prior Treatment) HPI Comments: Pt reports mild nausea, has slightly decreased appetite, has been eating normally however, was constipated some, had a normal non bloody, no melena stool last night.  Did not relieve pain.  No h/o diverticulitis, has never had a colonscopy.    Patient is a 78 y.o. female presenting with abdominal pain. The history is provided by the patient.  Abdominal Pain Pain location:  LLQ Pain quality: aching, fullness, heavy and throbbing   Pain radiates to:  Does not radiate Pain severity:  Moderate Onset quality:  Gradual Duration:  1 week Timing:  Intermittent Progression:  Worsening Chronicity:  New Context: not diet changes, not previous surgeries, not retching, not sick contacts, not suspicious food intake and not trauma   Relieved by:  Nothing Worsened by:  Nothing tried Ineffective treatments:  None tried Associated symptoms: constipation and nausea   Associated symptoms: no chills, no diarrhea, no dysuria, no fever and no vomiting   Risk factors: being elderly   Risk factors: no alcohol abuse, has not had multiple surgeries and no recent hospitalization     Past Medical History  Diagnosis Date  . Hypertension   . Diabetes mellitus   . Hyperlipidemia    Past Surgical History  Procedure Laterality Date  . 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 Heart failure Mother   . Diabetes Mother   . Alzheimer's disease Sister    History  Substance Use Topics  . Smoking status: Never Smoker   . Smokeless tobacco: Not on file  . Alcohol Use: No   OB History   Grav Para Term Preterm Abortions TAB SAB Ect Mult  Living                 Review of Systems  Constitutional: Positive for appetite change. Negative for fever and chills.  Gastrointestinal: Positive for nausea, abdominal pain and constipation. Negative for vomiting, diarrhea, blood in stool and rectal pain.  Genitourinary: Negative for dysuria, frequency, flank pain and pelvic pain.  Musculoskeletal: Negative for back pain.  Skin: Negative for rash.  Hematological: Negative for adenopathy.  All other systems reviewed and are negative.    Allergies  Review of patient's allergies indicates no known allergies.  Home Medications   Current Outpatient Rx  Name  Route  Sig  Dispense  Refill  . Ascorbic Acid (VITAMIN C PO)   Oral   Take 300 Units by mouth once a week.         Marland Kitchen aspirin 81 MG tablet   Oral   Take 81 mg by mouth daily.         Marland Kitchen lisinopril (PRINIVIL,ZESTRIL) 40 MG tablet   Oral   Take 1 tablet (40 mg total) by mouth daily.   30 tablet   3   . metFORMIN (GLUCOPHAGE) 500 MG tablet      Take one tablet by mouth two times daily for diabetes   60 tablet   5   . ciprofloxacin (CIPRO) 500 MG tablet   Oral   Take 1 tablet (500 mg total) by mouth 2 (two) times daily.   20 tablet  0   . HYDROcodone-acetaminophen (NORCO/VICODIN) 5-325 MG per tablet      1-2 tablets po q 6 hours prn moderate to severe pain   20 tablet   0   . metroNIDAZOLE (FLAGYL) 500 MG tablet   Oral   Take 1 tablet (500 mg total) by mouth 2 (two) times daily.   20 tablet   0   . promethazine (PHENERGAN) 25 MG tablet   Oral   Take 1 tablet (25 mg total) by mouth every 6 (six) hours as needed for nausea or vomiting.   20 tablet   0    BP 124/58  Pulse 82  Temp(Src) 98 F (36.7 C) (Oral)  Resp 18  Wt 150 lb 8 oz (68.266 kg)  SpO2 100% Physical Exam  Nursing note and vitals reviewed. Constitutional: She is oriented to person, place, and time. She appears well-developed and well-nourished. No distress.  HENT:  Head:  Normocephalic and atraumatic.  Eyes: Conjunctivae and EOM are normal. No scleral icterus.  Neck: Neck supple.  Cardiovascular: Normal rate, regular rhythm and intact distal pulses.   No murmur heard. Pulmonary/Chest: Effort normal.  Abdominal: Soft. She exhibits no distension. There is tenderness. There is no rebound and no guarding.  Neurological: She is alert and oriented to person, place, and time. She exhibits normal muscle tone. Coordination normal.  Skin: Skin is warm. She is not diaphoretic.  Psychiatric: She has a normal mood and affect.    ED Course  Procedures (including critical care time) Labs Review Labs Reviewed  CBC WITH DIFFERENTIAL - Abnormal; Notable for the following:    WBC 10.7 (*)    Monocytes Absolute 1.2 (*)    All other components within normal limits  COMPREHENSIVE METABOLIC PANEL - Abnormal; Notable for the following:    Glucose, Bld 184 (*)    Albumin 3.3 (*)    Total Bilirubin 0.2 (*)    GFR calc non Af Amer 81 (*)    All other components within normal limits  URINALYSIS, ROUTINE W REFLEX MICROSCOPIC - Abnormal; Notable for the following:    APPearance HAZY (*)    Leukocytes, UA TRACE (*)    All other components within normal limits  URINE MICROSCOPIC-ADD ON - Abnormal; Notable for the following:    Squamous Epithelial / LPF MANY (*)    All other components within normal limits  LIPASE, BLOOD   Imaging Review Ct Abdomen Pelvis W Contrast  08/01/2013   CLINICAL DATA:  Left lower quadrant pain  EXAM: CT ABDOMEN AND PELVIS WITH CONTRAST  TECHNIQUE: Multidetector CT imaging of the abdomen and pelvis was performed using the standard protocol following bolus administration of intravenous contrast.  CONTRAST:  OMNIPAQUE IOHEXOL 300 MG/ML  SOLN  COMPARISON:  None.  FINDINGS: There is wall thickening and inflammatory change involving the sigmoid colon. Several diverticuli are present. The findings are most consistent with acute diverticulitis. There is  stranding within the peritoneal fat extending into the pelvis but no definite evidence of abscess or extraluminal bowel gas.  Mild diffuse hepatic steatosis.  Gallbladder, spleen, pancreas, adrenal glands, left kidney are within normal limits. Simple cyst in the lower pole of the right kidney.  Normal appendix.  No evidence of abnormal retroperitoneal adenopathy.  Atherosclerotic changes of the aorta and iliac arteries are noted.  Trace free fluid in the right hemipelvis adjacent to the uterus.  No vertebral compression deformity. Advanced degenerative disc disease at L1-2 with vacuum and posterior osteophytic ridging.  IMPRESSION: Findings are most consistent with acute sigmoid diverticulitis without evidence of perforation or abscess formation.   Electronically Signed   By: Maryclare Bean M.D.   On: 08/01/2013 17:51    EKG Interpretation   None      ra sat is 96% and I interpret to be normal  6:23 PM Pt feels improved, CT shows acute uncomplicated diverticulitis.  Return precautions discussed with pt and family.  Pt understands to follow up with PCP next week.   MDM   1. Diverticulitis      Pt with LLQ pain and mild tenderness on exam, no fever.  Will get labs,UA, treat with IVF's and meds for pain and nausea, obtain CT of abd/pelvis to assess.      Gavin Pound. Oletta Lamas, MD 08/01/13 1610

## 2013-08-01 NOTE — ED Notes (Signed)
Pt reports Left side groin pain x1 week, denies any problems with bowel or bladder

## 2013-08-01 NOTE — Discharge Instructions (Signed)
Diverticulitis °A diverticulum is a small pouch or sac on the colon. Diverticulosis is the presence of these diverticula on the colon. Diverticulitis is the irritation (inflammation) or infection of diverticula. °CAUSES  °The colon and its diverticula contain bacteria. If food particles block the tiny opening to a diverticulum, the bacteria inside can grow and cause an increase in pressure. This leads to infection and inflammation and is called diverticulitis. °SYMPTOMS  °· Abdominal pain and tenderness. Usually, the pain is located on the left side of your abdomen. However, it could be located elsewhere. °· Fever. °· Bloating. °· Feeling sick to your stomach (nausea). °· Throwing up (vomiting). °· Abnormal stools. °DIAGNOSIS  °Your caregiver will take a history and perform a physical exam. Since many things can cause abdominal pain, other tests may be necessary. Tests may include: °· Blood tests. °· Urine tests. °· X-ray of the abdomen. °· CT scan of the abdomen. °Sometimes, surgery is needed to determine if diverticulitis or other conditions are causing your symptoms. °TREATMENT  °Most of the time, you can be treated without surgery. Treatment includes: °· Resting the bowels by only having liquids for a few days. As you improve, you will need to eat a low-fiber diet. °· Intravenous (IV) fluids if you are losing body fluids (dehydrated). °· Antibiotic medicines that treat infections may be given. °· Pain and nausea medicine, if needed. °· Surgery if the inflamed diverticulum has burst. °HOME CARE INSTRUCTIONS  °· Try a clear liquid diet (broth, tea, or water for as long as directed by your caregiver). You may then gradually begin a low-fiber diet as tolerated.  °A low-fiber diet is a diet with less than 10 grams of fiber. Choose the foods below to reduce fiber in the diet: °· White breads, cereals, rice, and pasta. °· Cooked fruits and vegetables or soft fresh fruits and vegetables without the skin. °· Ground or  well-cooked tender beef, ham, veal, lamb, pork, or poultry. °· Eggs and seafood. °· After your diverticulitis symptoms have improved, your caregiver may put you on a high-fiber diet. A high-fiber diet includes 14 grams of fiber for every 1000 calories consumed. For a standard 2000 calorie diet, you would need 28 grams of fiber. Follow these diet guidelines to help you increase the fiber in your diet. It is important to slowly increase the amount fiber in your diet to avoid gas, constipation, and bloating. °· Choose whole-grain breads, cereals, pasta, and brown rice. °· Choose fresh fruits and vegetables with the skin on. Do not overcook vegetables because the more vegetables are cooked, the more fiber is lost. °· Choose more nuts, seeds, legumes, dried peas, beans, and lentils. °· Look for food products that have greater than 3 grams of fiber per serving on the Nutrition Facts label. °· Take all medicine as directed by your caregiver. °· If your caregiver has given you a follow-up appointment, it is very important that you go. Not going could result in lasting (chronic) or permanent injury, pain, and disability. If there is any problem keeping the appointment, call to reschedule. °SEEK MEDICAL CARE IF:  °· Your pain does not improve. °· You have a hard time advancing your diet beyond clear liquids. °· Your bowel movements do not return to normal. °SEEK IMMEDIATE MEDICAL CARE IF:  °· Your pain becomes worse. °· You have an oral temperature above 102° F (38.9° C), not controlled by medicine. °· You have repeated vomiting. °· You have bloody or black, tarry stools. °·   Symptoms that brought you to your caregiver become worse or are not getting better. MAKE SURE YOU:   Understand these instructions.  Will watch your condition.  Will get help right away if you are not doing well or get worse. Document Released: 05/31/2005 Document Revised: 11/13/2011 Document Reviewed: 09/26/2010 [facility] Patient Information  2014 [place] [place].    Narcotic and benzodiazepine use may cause drowsiness, slowed breathing or dependence.  Please use with caution and do not drive, operate machinery or watch young children alone while taking them.  Taking combinations of these medications or drinking alcohol will potentiate these effects.

## 2013-08-01 NOTE — ED Notes (Signed)
Pt reports LLQ pain x 1 week progressively worsening. States "it is just this annoying pain." Denies dysuria, denies pain on palpation. Reports last BM yesterday, feels "mildly constipated." Denies flank pain. Mild nausea.

## 2013-08-01 NOTE — ED Notes (Signed)
MD Ghim at bedside. 

## 2013-08-07 ENCOUNTER — Encounter: Payer: Self-pay | Admitting: Internal Medicine

## 2013-08-07 ENCOUNTER — Ambulatory Visit (INDEPENDENT_AMBULATORY_CARE_PROVIDER_SITE_OTHER): Payer: Medicare Other | Admitting: Internal Medicine

## 2013-08-07 VITALS — BP 120/72 | HR 68 | Temp 97.4°F | Ht 65.0 in | Wt 151.4 lb

## 2013-08-07 DIAGNOSIS — E119 Type 2 diabetes mellitus without complications: Secondary | ICD-10-CM | POA: Diagnosis not present

## 2013-08-07 DIAGNOSIS — K5732 Diverticulitis of large intestine without perforation or abscess without bleeding: Secondary | ICD-10-CM | POA: Diagnosis not present

## 2013-08-07 DIAGNOSIS — K5792 Diverticulitis of intestine, part unspecified, without perforation or abscess without bleeding: Secondary | ICD-10-CM

## 2013-08-07 MED ORDER — ONETOUCH ULTRASOFT LANCETS MISC: Status: DC

## 2013-08-07 MED ORDER — GLUCOSE BLOOD VI STRP: ORAL_STRIP | Status: DC

## 2013-08-07 NOTE — Progress Notes (Signed)
Patient ID: Jocelyn Hendrix, female   DOB: 1935-01-21, 78 y.o.   MRN: [id number]   Location:  [facility]  No Known Allergies  Chief Complaint  Patient presents with  . Hospitalization Follow-up    hospital f/u   . other    discuss side effects of all the medication she was given.  Cipro stopped due to feeling out of it.    HPI: Patient is a 78 y.o. black female seen in the office today for hospital f/u.   Pain lower left side for 2 wks.   Finally a friend said you need to try jumping up and down, if excruciating, you better get to hospital--pain was bad so her daughter took her to the hospital Turned out to be diverticulitis--prescribed cipro and flagyl--took cipro for 4 days, then stopped b/c it made her feel out of it.  Stopped oxycodone for pain also.  Was having hallucinations.  Going to restart the abx again.   Held metformin while on abx.   Feels great.  Went to bingo last night.  Abdominal pain is gone.    Review of Systems:  Review of Systems  Constitutional: Negative for fever and chills.  HENT: Negative for congestion.   Eyes: Negative for blurred vision.  Respiratory: Negative for shortness of breath.   Cardiovascular: Negative for chest pain.  Gastrointestinal:       Pain resolved, flatus improved, bowels moving ok, did not have bm yesterday (was busy)  Genitourinary: Negative for dysuria.  Musculoskeletal: Negative for myalgias.  Neurological: Negative for dizziness.  Endo/Heme/Allergies: Does not bruise/bleed easily.  Psychiatric/Behavioral: Negative for depression and memory loss.     Past Medical History  Diagnosis Date  . Hypertension   . Diabetes mellitus   . Hyperlipidemia     Past Surgical History  Procedure Laterality Date  . 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 She does not have any smokeless tobacco history  on file. She reports that she does not drink alcohol or use illicit drugs.  Family History  Problem Relation Age of Onset  . Heart failure Mother   . Diabetes Mother   . Alzheimer's disease Sister     Medications: Patient's Medications  New Prescriptions   No medications on file  Previous Medications   ASCORBIC ACID (VITAMIN C PO)    Take 300 Units by mouth once a week.   ASPIRIN 81 MG TABLET    Take 81 mg by mouth daily.   HYDROCODONE-ACETAMINOPHEN (NORCO/VICODIN) 5-325 MG PER TABLET    1-2 tablets po q 6 hours prn moderate to severe pain   LISINOPRIL (PRINIVIL,ZESTRIL) 40 MG TABLET    Take 1 tablet (40 mg total) by mouth daily.   METFORMIN (GLUCOPHAGE) 500 MG TABLET    Take one tablet by mouth two times daily for diabetes   METRONIDAZOLE (FLAGYL) 500 MG TABLET    Take 1 tablet (500 mg total) by mouth 2 (two) times daily.   PROMETHAZINE (PHENERGAN) 25 MG TABLET    Take 1 tablet (25 mg total) by mouth every 6 (six) hours as needed for nausea or vomiting.  Modified Medications   No medications on file  Discontinued Medications   CIPROFLOXACIN (CIPRO) 500 MG TABLET    Take 1 tablet (500 mg total) by mouth 2 (two) times daily.     Physical Exam:  Filed Vitals:   08/07/13 1049  BP: 120/72  Pulse: 68  Temp: 97.4 F (36.3 C)  TempSrc: Oral  Height: 5\' 5"  (1.651 m)  Weight: 151 lb 6.4 oz (68.675 kg)  SpO2: 98%  Physical Exam  Constitutional: She is oriented to person, place, and time. She appears well-developed and well-nourished. No distress.  Cardiovascular: Normal rate, regular rhythm, normal heart sounds and intact distal pulses.   Pulmonary/Chest: Effort normal and breath sounds normal. No respiratory distress.  Abdominal: Soft. Bowel sounds are normal. She exhibits no distension. There is no tenderness.  Musculoskeletal: Normal range of motion. She exhibits no edema and no tenderness.  Neurological: She is alert and oriented to person, place, and time.  Skin: Skin is warm  and dry.     Labs reviewed: Basic Metabolic Panel:  Recent Labs  16/10/96 0849 03/19/13 0840 08/01/13 1625  NA 139 140 135  K 4.2 4.4 4.0  CL 100 101 100  CO2 26 27 24   GLUCOSE 109* 115* 184*  BUN 19 15 15   CREATININE 0.78 0.89 0.72  CALCIUM 9.6 10.2 9.2   Liver Function Tests:  Recent Labs  12/03/12 0849 08/01/13 1625  AST 27 21  ALT 17 11  ALKPHOS 74 81  BILITOT 0.2 0.2*  PROT 7.0 7.7  ALBUMIN  --  3.3*    Recent Labs  08/01/13 1625  LIPASE 37   No results found for this basename: AMMONIA,  in the last 8760 hours CBC:  Recent Labs  08/01/13 1625  WBC 10.7*  NEUTROABS 6.7  HGB 13.0  HCT 38.6  MCV 90.0  PLT 159   Lipid Panel:  Recent Labs  12/03/12 0849 03/19/13 0840  HDL 56 62  LDLCALC 127* 133*  TRIG 142 169*  CHOLHDL 3.8 3.7   Lab Results  Component Value Date   HGBA1C 6.6* 03/19/2013    Past Procedures: 08/01/13 Ct abdomen and pelvis:  Findings are most consistent with acute sigmoid diverticulitis  without evidence of perforation or abscess formation.  Assessment/Plan 1. Diverticulitis -seems resolved, but does plan to resume course of abx as directed -discussed risks of this condition and she understands  2. Type II or unspecified type diabetes mellitus without mention of complication, not stated as uncontrolled -discussed different types of meters--given verioIQ onetouch meter and scripts for strips and lancets to go with it - glucose blood test strip; Use as instructed  Dispense: 100 each; Refill: 12 - Lancets (ONETOUCH ULTRASOFT) lancets; Use as instructed  Dispense: 100 each; Refill: 12 -cont current therapy--will recheck labs in 3 mos at regular f/u appt  Next appt:  3 mos

## 2013-08-11 ENCOUNTER — Other Ambulatory Visit: Payer: Self-pay | Admitting: Nurse Practitioner

## 2013-11-04 ENCOUNTER — Encounter: Payer: Self-pay | Admitting: *Deleted

## 2013-11-06 ENCOUNTER — Ambulatory Visit (INDEPENDENT_AMBULATORY_CARE_PROVIDER_SITE_OTHER): Payer: Medicare Other | Admitting: Internal Medicine

## 2013-11-06 ENCOUNTER — Encounter: Payer: Self-pay | Admitting: Internal Medicine

## 2013-11-06 VITALS — BP 124/80 | HR 68 | Resp 10 | Wt 152.6 lb

## 2013-11-06 DIAGNOSIS — I10 Essential (primary) hypertension: Secondary | ICD-10-CM

## 2013-11-06 DIAGNOSIS — E119 Type 2 diabetes mellitus without complications: Secondary | ICD-10-CM

## 2013-11-06 DIAGNOSIS — K5732 Diverticulitis of large intestine without perforation or abscess without bleeding: Secondary | ICD-10-CM | POA: Diagnosis not present

## 2013-11-06 DIAGNOSIS — E785 Hyperlipidemia, unspecified: Secondary | ICD-10-CM | POA: Diagnosis not present

## 2013-11-06 DIAGNOSIS — K5792 Diverticulitis of intestine, part unspecified, without perforation or abscess without bleeding: Secondary | ICD-10-CM | POA: Insufficient documentation

## 2013-11-06 NOTE — Progress Notes (Signed)
Patient ID: Jocelyn Hendrix, female   DOB: 1935-03-23, 79 y.o.   MRN: [id number]   Location:  [facility]  Code Status: has living will, does not have a HCPOA, full code unless she has no mentation she says (tells me a story of a young relative who was not resuscitated when she passed out and wound up dying so does not want to have a DNR form)   No Known Allergies  Chief Complaint  Patient presents with  . Medical Managment of Chronic Issues    3 month follow-up, no recent labs    . Rash    Examine right side of neck- ? reaction to body spray     HPI: Patient is a 79 y.o. seen in the office today for medical mgt of chronic diseases and rash on the right side of her neck.  Thinks she sprayed body spray on her scarf and it caused irritation--raised rash only where it rubbed her neck.  Bowels are back to normal.  Mental state back to normal after off abx and pain meds.    Gained a few lbs with cabin fever.  Says she has had too much bread, etc.  Tired of just sitting around.  Sugars have been in the 80s-130s.    Review of Systems:  Review of Systems  Constitutional: Negative for fever and malaise/fatigue.       Weight gain with food choices in winter  HENT: Negative for congestion.   Eyes: Negative for blurred vision.  Respiratory: Negative for shortness of breath.   Cardiovascular: Negative for chest pain and leg swelling.  Gastrointestinal: Negative for abdominal pain and constipation.       No difficulty since episode of diverticulitis  Genitourinary: Negative for dysuria.  Musculoskeletal: Negative for back pain and falls.       As long as she gets out of bed by rolling  Skin: Positive for itching and rash.       Right neck  Neurological: Negative for dizziness and weakness.  Psychiatric/Behavioral: Negative for depression and memory loss. The patient does not have insomnia.     Past Medical History  Diagnosis Date  . Hypertension     . Diabetes mellitus   . Hyperlipidemia   . Vitamin D deficiency   . 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 Myalgia and myositis, unspecified     Past Surgical History  Procedure Laterality Date  . 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 Cholecystectomy      Social History:   reports that she has never smoked. She does not have any smokeless tobacco history on file. She reports that she does not drink alcohol or use illicit drugs.  Family History  Problem Relation Age of Onset  . Heart failure Mother   . Diabetes Mother   . Alzheimer's disease Sister     Medications: Patient's Medications  New Prescriptions   No medications on file  Previous Medications   ASPIRIN 81 MG TABLET    Take 81 mg by mouth daily.   GLUCOSE BLOOD TEST STRIP    Use as instructed   LANCETS (ONETOUCH ULTRASOFT) LANCETS    Use as instructed   METFORMIN (GLUCOPHAGE) 500 MG TABLET    Take one tablet by mouth two times daily for diabetes  Modified Medications   Modified Medication Previous Medication   LISINOPRIL (PRINIVIL,ZESTRIL) 40 MG TABLET lisinopril (PRINIVIL,ZESTRIL) 40  MG tablet      TAKE 1 TABLET BY MOUTH EVERY DAY for Blood pressure    TAKE 1 TABLET BY MOUTH EVERY DAY  Discontinued Medications   ASCORBIC ACID (VITAMIN C PO)    Take 300 Units by mouth once a week.   HYDROCODONE-ACETAMINOPHEN (NORCO/VICODIN) 5-325 MG PER TABLET    1-2 tablets po q 6 hours prn moderate to severe pain   METRONIDAZOLE (FLAGYL) 500 MG TABLET    Take 1 tablet (500 mg total) by mouth 2 (two) times daily.   PROMETHAZINE (PHENERGAN) 25 MG TABLET    Take 1 tablet (25 mg total) by mouth every 6 (six) hours as needed for nausea or vomiting.   Physical Exam: Filed Vitals:   11/06/13 1000  BP: 124/80  Pulse: 68  Resp: 10  Weight: 152 lb 9.6 oz (69.219 kg)  SpO2: 99%  Physical Exam  Constitutional: She is oriented to person, place, and time. She appears well-developed  and well-nourished. No distress.  HENT:  Head: Normocephalic and atraumatic.  Cardiovascular: Normal rate, regular rhythm, normal heart sounds and intact distal pulses.   Pulmonary/Chest: Effort normal and breath sounds normal. No respiratory distress.  Abdominal: Soft. Bowel sounds are normal. She exhibits no distension and no mass. There is no tenderness.  Musculoskeletal: Normal range of motion. She exhibits no edema and no tenderness.  Neurological: She is alert and oriented to person, place, and time.  Skin:  Right neck with erythematous papular rash beneath scarf  Psychiatric: She has a normal mood and affect.    Labs reviewed: Basic Metabolic Panel:  Recent Labs  45/40/9802/09/17 0849 03/19/13 0840 08/01/13 1625  NA 139 140 135  K 4.2 4.4 4.0  CL 100 101 100  CO2 26 27 24   GLUCOSE 109* 115* 184*  BUN 19 15 15   CREATININE 0.78 0.89 0.72  CALCIUM 9.6 10.2 9.2   Liver Function Tests:  Recent Labs  12/03/12 0849 08/01/13 1625  AST 27 21  ALT 17 11  ALKPHOS 74 81  BILITOT 0.2 0.2*  PROT 7.0 7.7  ALBUMIN  --  3.3*    Recent Labs  08/01/13 1625  LIPASE 37  CBC:  Recent Labs  08/01/13 1625  WBC 10.7*  NEUTROABS 6.7  HGB 13.0  HCT 38.6  MCV 90.0  PLT 159   Lipid Panel:  Recent Labs  12/03/12 0849 03/19/13 0840  HDL 56 62  LDLCALC 127* 133*  TRIG 142 169*  CHOLHDL 3.8 3.7   Lab Results  Component Value Date   HGBA1C 6.6* 03/19/2013    Assessment/Plan 1. Type II or unspecified type diabetes mellitus without mention of complication, not stated as uncontrolled - has been quite stable though gained a little weight over the winter and ate some things she shouldn't, but glucose readings have remained wonderful - Basic metabolic panel; Future - Hemoglobin A1c; Future - Lipid panel; Future  2. Diverticulitis -no further flare ups since November 2014 hospitalization  3. Essential hypertension, benign -bp at goal  4. Hyperlipidemia LDL goal <  100 -encouraged to continue her walking and resume her low carb diet - Lipid panel; Future  Labs/tests ordered: Orders Placed This Encounter  Procedures  . Basic metabolic panel    Standing Status: Future     Number of Occurrences:      Standing Expiration Date: 05/09/2014  . Hemoglobin A1c    Standing Status: Future     Number of Occurrences:      Standing  Expiration Date: 05/09/2014  . CBC with Differential    Standing Status: Future     Number of Occurrences:      Standing Expiration Date: 05/09/2014  . Lipid panel    Standing Status: Future     Number of Occurrences:      Standing Expiration Date: 05/09/2014    Next appt:  3 mos labs before

## 2013-11-17 ENCOUNTER — Other Ambulatory Visit: Payer: Self-pay | Admitting: *Deleted

## 2013-11-17 DIAGNOSIS — E119 Type 2 diabetes mellitus without complications: Secondary | ICD-10-CM

## 2013-11-17 MED ORDER — GLUCOSE BLOOD VI STRP: ORAL_STRIP | Status: DC

## 2013-11-23 ENCOUNTER — Other Ambulatory Visit: Payer: Self-pay | Admitting: Internal Medicine

## 2013-12-14 ENCOUNTER — Other Ambulatory Visit: Payer: Self-pay | Admitting: Internal Medicine

## 2014-01-14 ENCOUNTER — Other Ambulatory Visit: Payer: Self-pay | Admitting: Nurse Practitioner

## 2014-02-02 ENCOUNTER — Other Ambulatory Visit: Payer: Medicare Other

## 2014-02-02 DIAGNOSIS — E119 Type 2 diabetes mellitus without complications: Secondary | ICD-10-CM

## 2014-02-02 DIAGNOSIS — E785 Hyperlipidemia, unspecified: Secondary | ICD-10-CM

## 2014-02-02 DIAGNOSIS — I10 Essential (primary) hypertension: Secondary | ICD-10-CM

## 2014-02-03 LAB — CBC WITH DIFFERENTIAL/PLATELET
Basophils Absolute: 0.1 10*3/uL (ref 0.0–0.2)
Basos: 1 %
Eos: 3 %
Eosinophils Absolute: 0.2 10*3/uL (ref 0.0–0.4)
HCT: 39.5 % (ref 34.0–46.6)
Hemoglobin: 13.8 g/dL (ref 11.1–15.9)
Immature Grans (Abs): 0 10*3/uL (ref 0.0–0.1)
Immature Granulocytes: 0 %
Lymphocytes Absolute: 3.1 10*3/uL (ref 0.7–3.1)
Lymphs: 45 %
MCH: 30.5 pg (ref 26.6–33.0)
MCHC: 34.9 g/dL (ref 31.5–35.7)
MCV: 87 fL (ref 79–97)
Monocytes Absolute: 0.7 10*3/uL (ref 0.1–0.9)
Monocytes: 10 %
Neutrophils Absolute: 2.9 10*3/uL (ref 1.4–7.0)
Neutrophils Relative %: 41 %
RBC: 4.53 x10E6/uL (ref 3.77–5.28)
RDW: 13.6 % (ref 12.3–15.4)
WBC: 6.9 10*3/uL (ref 3.4–10.8)

## 2014-02-03 LAB — LIPID PANEL
Chol/HDL Ratio: 4.1 ratio units (ref 0.0–4.4)
Cholesterol, Total: 243 mg/dL — ABNORMAL HIGH (ref 100–199)
HDL: 60 mg/dL (ref >39)
LDL Calculated: 152 mg/dL — ABNORMAL HIGH (ref 0–99)
Triglycerides: 157 mg/dL — ABNORMAL HIGH (ref 0–149)
VLDL Cholesterol Cal: 31 mg/dL (ref 5–40)

## 2014-02-03 LAB — BASIC METABOLIC PANEL
BUN/Creatinine Ratio: 20 (ref 11–26)
BUN: 18 mg/dL (ref 8–27)
CO2: 27 mmol/L (ref 18–29)
Calcium: 9.9 mg/dL (ref 8.7–10.3)
Chloride: 100 mmol/L (ref 97–108)
Creatinine, Ser: 0.89 mg/dL (ref 0.57–1.00)
GFR calc Af Amer: 73 mL/min/{1.73_m2} (ref >59)
GFR calc non Af Amer: 63 mL/min/{1.73_m2} (ref >59)
Glucose: 124 mg/dL — ABNORMAL HIGH (ref 65–99)
Potassium: 4.6 mmol/L (ref 3.5–5.2)
Sodium: 139 mmol/L (ref 134–144)

## 2014-02-03 LAB — HEMOGLOBIN A1C
Est. average glucose Bld gHb Est-mCnc: 157 mg/dL
Hgb A1c MFr Bld: 7.1 % — ABNORMAL HIGH (ref 4.8–5.6)

## 2014-02-06 ENCOUNTER — Ambulatory Visit: Payer: Medicare Other | Admitting: Internal Medicine

## 2014-02-14 IMAGING — CT CT ABD-PELV W/ CM
2 of 5 series · 15 of 46 positions shown, 17 images · IV contrast (APPLIED)
Comparison: None.

CLINICAL DATA: Left lower quadrant pain

EXAM:
CT ABDOMEN AND PELVIS WITH CONTRAST
TECHNIQUE: Multidetector CT imaging of the abdomen and pelvis was performed
using the standard protocol following bolus administration of
intravenous contrast.
CONTRAST:  100mL OMNIPAQUE IOHEXOL 300 MG/ML  SOLN

[Series 2: abd/ pelvis 5.0 i30f 1 · axial · 0.72mm/px · z∈[+909,+1289]mm · 12 of 86 slices shown, 14 images]
[im 5/86  soft-tissue]
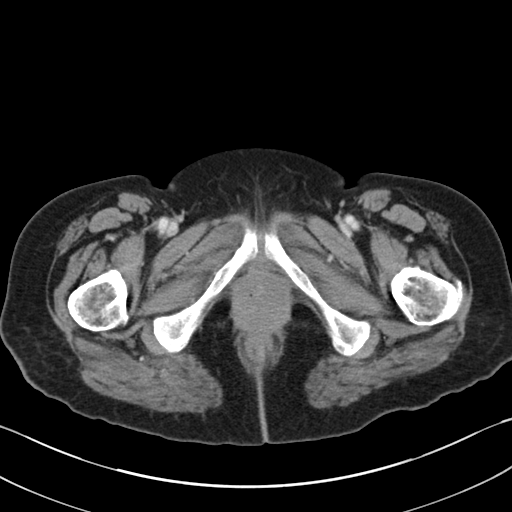
[im 5/86  bone]
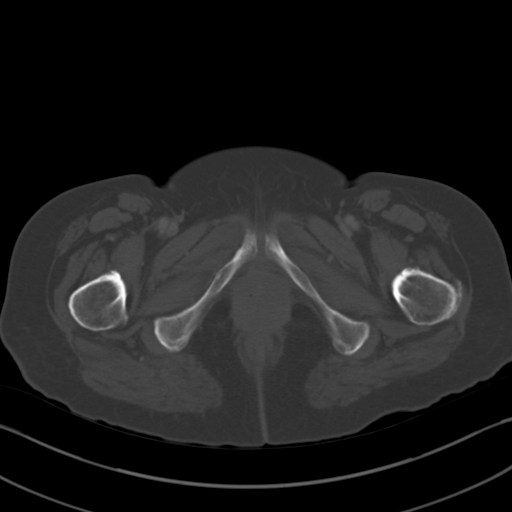
[im 13/86  soft-tissue]
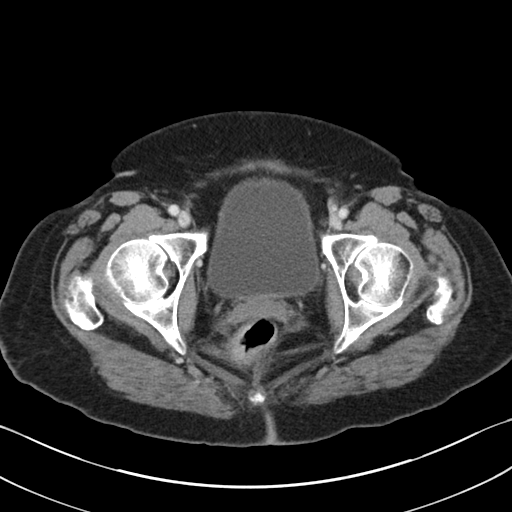
[im 18/86  soft-tissue]
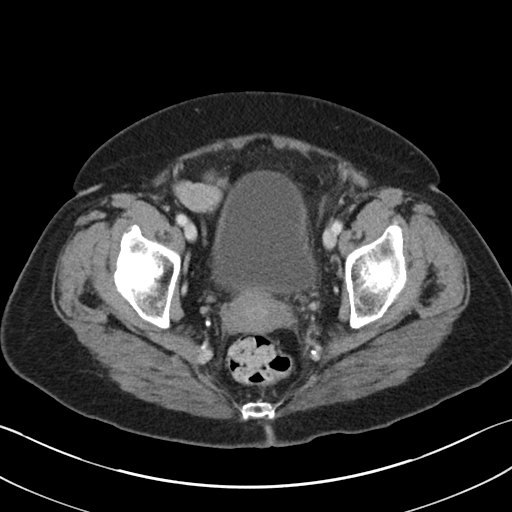
[im 26/86  soft-tissue]
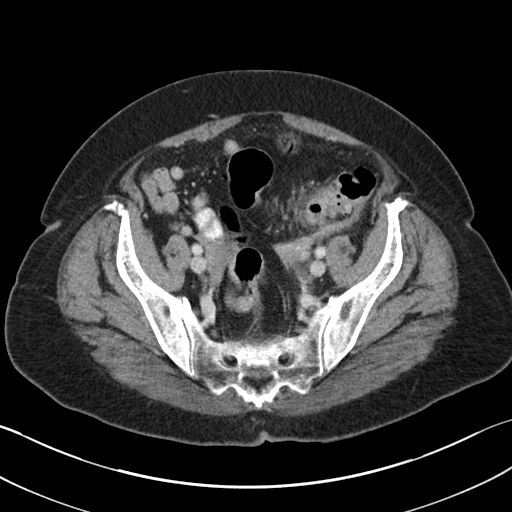
[im 35/86  soft-tissue]
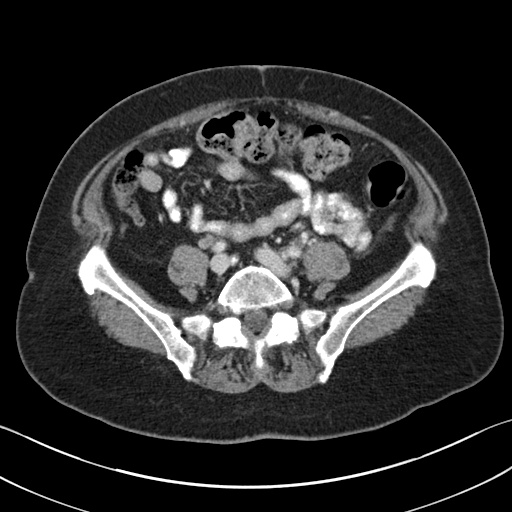
[im 39/86  soft-tissue]
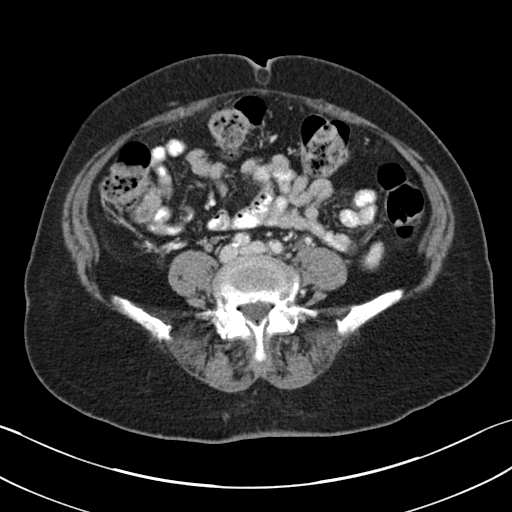
[im 47/86  soft-tissue]
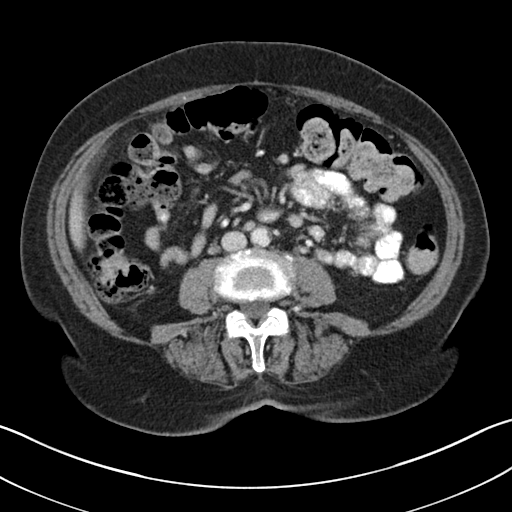
[im 52/86  soft-tissue]
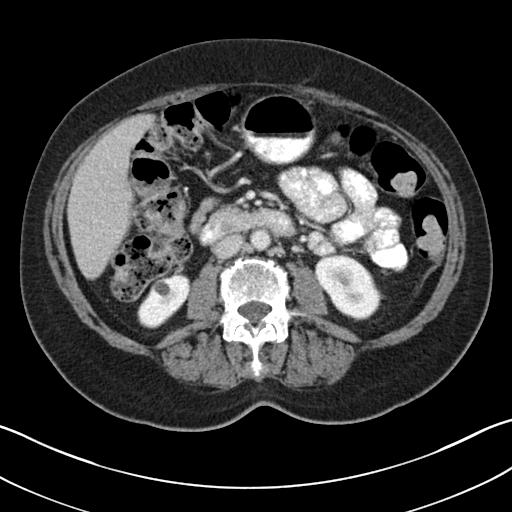
[im 60/86  soft-tissue]
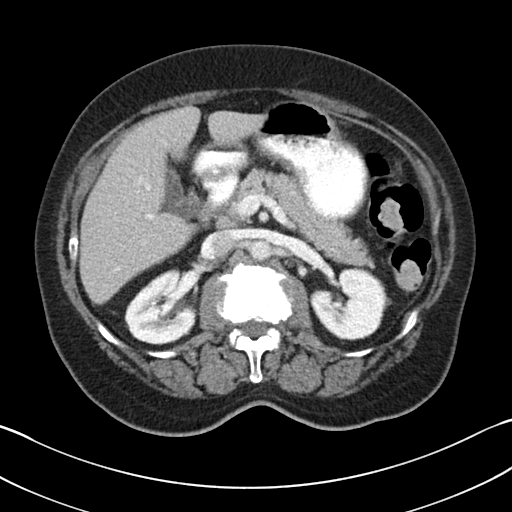
[im 60/86  bone]
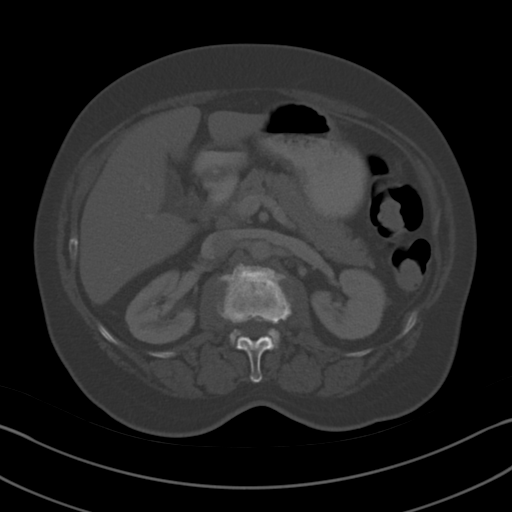
[im 69/86  soft-tissue]
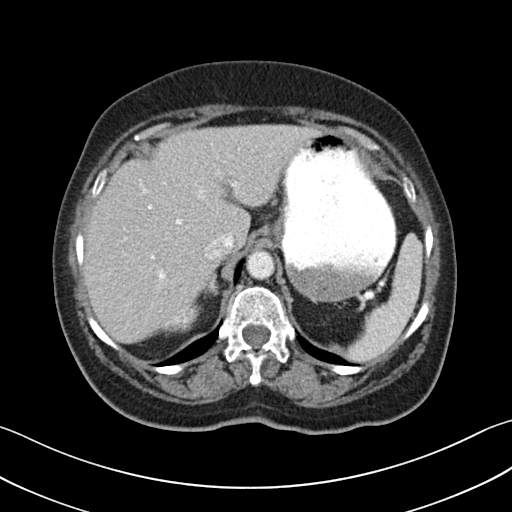
[im 73/86  soft-tissue]
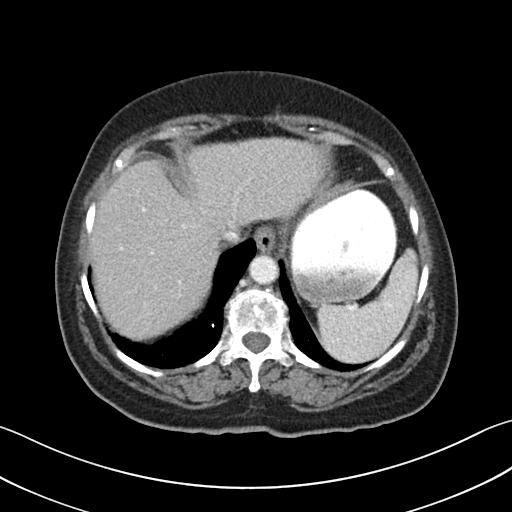
[im 81/86  soft-tissue]
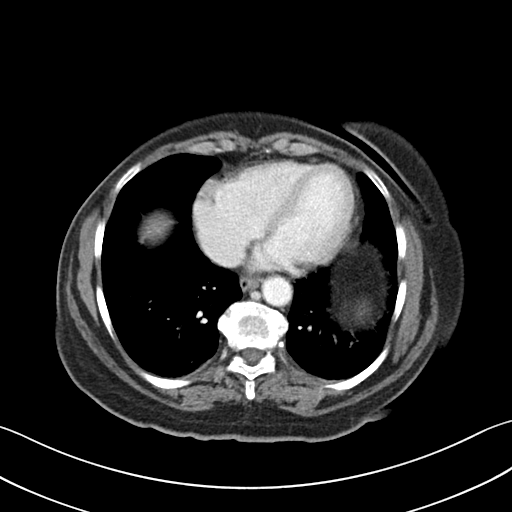

[Series 4: cor · coronal · 0.61mm/px · 3 of 125 slices shown]
[im 42/125  soft-tissue]
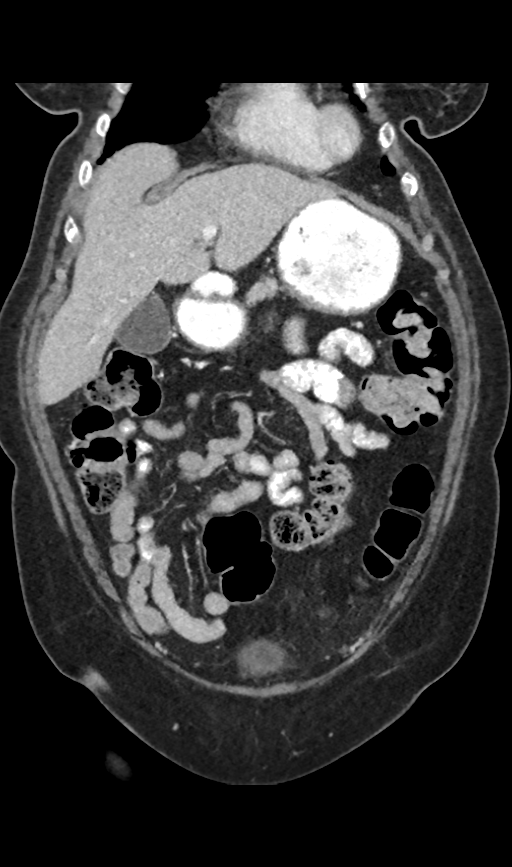
[im 56/125  soft-tissue]
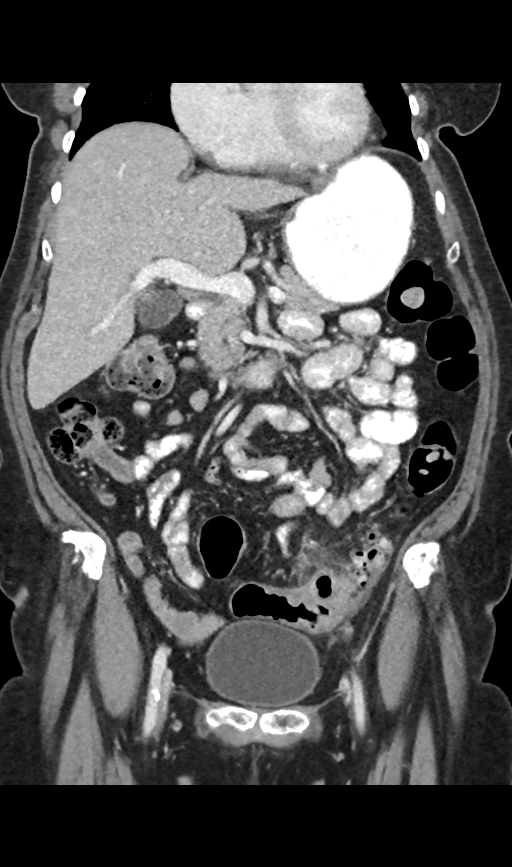
[im 69/125  soft-tissue]
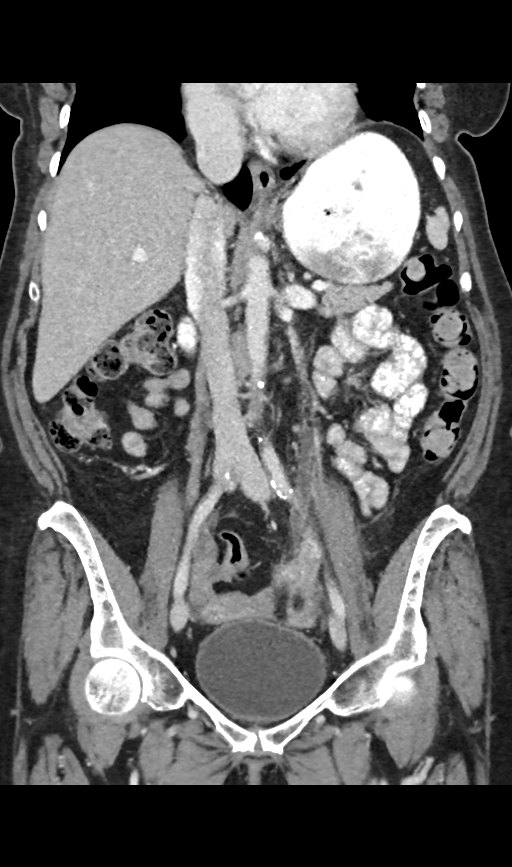

[15 of 46 positions shown; findings below may reference images not displayed]

FINDINGS: There is wall thickening and inflammatory change involving the
sigmoid colon. Several diverticuli are present. The findings are
most consistent with acute diverticulitis. There is stranding within
the peritoneal fat extending into the pelvis but no definite
evidence of abscess or extraluminal bowel gas.

Mild diffuse hepatic steatosis.

Gallbladder, spleen, pancreas, adrenal glands, left kidney are
within normal limits. Simple cyst in the lower pole of the right
kidney.

Normal appendix.

No evidence of abnormal retroperitoneal adenopathy.

Atherosclerotic changes of the aorta and iliac arteries are noted.

Trace free fluid in the right hemipelvis adjacent to the uterus.

No vertebral compression deformity. Advanced degenerative disc
disease at L1-2 with vacuum and posterior osteophytic ridging.
IMPRESSION: Findings are most consistent with acute sigmoid diverticulitis
without evidence of perforation or abscess formation.

## 2014-02-18 ENCOUNTER — Other Ambulatory Visit: Payer: Self-pay | Admitting: Internal Medicine

## 2014-02-19 ENCOUNTER — Ambulatory Visit (INDEPENDENT_AMBULATORY_CARE_PROVIDER_SITE_OTHER): Payer: Medicare Other | Admitting: Internal Medicine

## 2014-02-19 ENCOUNTER — Encounter: Payer: Self-pay | Admitting: Internal Medicine

## 2014-02-19 ENCOUNTER — Ambulatory Visit: Payer: Medicare Other | Admitting: Internal Medicine

## 2014-02-19 VITALS — BP 122/78 | HR 72 | Temp 99.2°F | Resp 18 | Ht 65.0 in | Wt 150.2 lb

## 2014-02-19 DIAGNOSIS — I10 Essential (primary) hypertension: Secondary | ICD-10-CM

## 2014-02-19 DIAGNOSIS — E119 Type 2 diabetes mellitus without complications: Secondary | ICD-10-CM

## 2014-02-19 NOTE — Progress Notes (Signed)
Patient ID: Jocelyn Hendrix, female   DOB: [birth year], 79 y.o.   MRN: [id number]   [facility]  [facility]  Code Status: full code--had family experience that made her feel this way, MOST completed today and scanned  No Known Allergies  Chief Complaint  Patient presents with  . Follow-up    HPI: Patient is a 79 y.o. black female seen in the office today for medical mgt of chronic diseases.  Bad cholesterol is up.  Is exercising.  Did have a cookout right before she came for her labs.  Has been eating more pineapple and pastries her daughter bought so they probably made her sugar go up.  Did lose 2 lbs.  Drinking more water also.   Only takes metformin in the mornings.  Says she may need to take that second pill at night.    Continues to avoid peanuts since diverticulitis episode.  No new problems here--no abdominal pain.  Bowels are moving.     Review of Systems:  Review of Systems  Constitutional: Negative for fever and chills.  HENT: Negative for hearing loss.   Eyes: Negative for blurred vision.  Respiratory: Negative for shortness of breath.   Cardiovascular: Negative for chest pain and leg swelling.  Gastrointestinal: Negative for heartburn, abdominal pain, constipation, blood in stool and melena.  Genitourinary: Negative for dysuria, urgency and frequency.  Musculoskeletal: Negative for falls and myalgias.  Skin: Negative for rash.  Neurological: Negative for dizziness, loss of consciousness and headaches.  Endo/Heme/Allergies: Does not bruise/bleed easily.  Psychiatric/Behavioral: Negative for depression and memory loss.    Past Medical History  Diagnosis Date  . Hypertension   . Diabetes mellitus   . Hyperlipidemia   . Vitamin D deficiency   . 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 Myalgia and myositis, unspecified     Past Surgical History  Procedure Laterality Date  . 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 Cholecystectomy      Social History:   reports that she has never smoked. She does not have any smokeless tobacco history on file. She reports that she does not drink alcohol or use illicit drugs.  Family History  Problem Relation Age of Onset  . Heart failure Mother   . Diabetes Mother   . Alzheimer's disease Sister     Medications: Patient's Medications  New Prescriptions   No medications on file  Previous Medications   ASPIRIN 81 MG TABLET    Take 81 mg by mouth daily.   BAYER CONTOUR TEST TEST STRIP    TEST SUGAR 1 TO 2 TIMES A DAY(DX 250.00)   LANCETS (ONETOUCH ULTRASOFT) LANCETS    Use as instructed   LISINOPRIL (PRINIVIL,ZESTRIL) 40 MG TABLET    TAKE 1 TABLET BY MOUTH EVERY DAY   METFORMIN (GLUCOPHAGE) 500 MG TABLET    TAKE ONE TABLET BY MOUTH TWO TIMES DAILY FOR DIABETES  Modified Medications   No medications on file  Discontinued Medications   LISINOPRIL (PRINIVIL,ZESTRIL) 40 MG TABLET    TAKE 1 TABLET BY MOUTH EVERY DAY for Blood pressure     Physical Exam: Filed Vitals:   02/19/14 1100  BP: 122/78  Pulse: 72  Temp: 99.2 F (37.3 C)  TempSrc: Oral  Resp: 18  Height: 5\' 5"  (1.651 m)  Weight: 150 lb 3.2 oz (68.13 kg)  SpO2: 98%  Physical Exam  Constitutional: She is oriented  to person, place, and time. She appears well-developed and well-nourished. No distress.  Cardiovascular: Normal rate, regular rhythm, normal heart sounds and intact distal pulses.   Pulmonary/Chest: Effort normal and breath sounds normal. No respiratory distress.  Abdominal: Soft. Bowel sounds are normal. She exhibits no distension and no mass. There is no tenderness.  Musculoskeletal: Normal range of motion. She exhibits no edema and no tenderness.  Neurological: She is alert and oriented to person, place, and time.  Skin: Skin is warm and dry.  Psychiatric: She has a normal mood and affect.     Labs reviewed: Basic Metabolic Panel:  Recent  Labs  03/19/13 0840 08/01/13 1625 02/02/14 0850  NA 140 135 139  K 4.4 4.0 4.6  CL 101 100 100  CO2 27 24 27   GLUCOSE 115* 184* 124*  BUN 15 15 18   CREATININE 0.89 0.72 0.89  CALCIUM 10.2 9.2 9.9   Liver Function Tests:  Recent Labs  08/01/13 1625  AST 21  ALT 11  ALKPHOS 81  BILITOT 0.2*  PROT 7.7  ALBUMIN 3.3*    Recent Labs  08/01/13 1625  LIPASE 37   No results found for this basename: AMMONIA,  in the last 8760 hours CBC:  Recent Labs  08/01/13 1625 02/02/14 0850  WBC 10.7* 6.9  NEUTROABS 6.7 2.9  HGB 13.0 13.8  HCT 38.6 39.5  MCV 90.0 87  PLT 159  --    Lipid Panel:  Recent Labs  03/19/13 0840 02/02/14 0850  HDL 62 60  LDLCALC 133* 152*  TRIG 169* 157*  CHOLHDL 3.7 4.1   Lab Results  Component Value Date   HGBA1C 7.1* 02/02/2014   Assessment/Plan 1. Type II or unspecified type diabetes mellitus without mention of complication, not stated as uncontrolled -will start taking both metformin tablets daily as directed and increase her exercise, up her dietary regimen also - CBC With differential/Platelet; Future - Comprehensive metabolic panel; Future - Hemoglobin A1c; Future - Lipid panel; Future  2. Essential hypertension, benign -at goal  - Lipid panel; Future  3.  Hyperlipidemia -goal LDL <100 -needs to increase her diet and exercise--reviewed and she understands well  Labs/tests ordered:   Orders Placed This Encounter  Procedures  . CBC With differential/Platelet    Standing Status: Future     Number of Occurrences:      Standing Expiration Date: 08/22/2015  . Comprehensive metabolic panel    Standing Status: Future     Number of Occurrences:      Standing Expiration Date: 08/22/2015  . Hemoglobin A1c    Standing Status: Future     Number of Occurrences:      Standing Expiration Date: 08/22/2015  . Lipid panel    Standing Status: Future     Number of Occurrences:      Standing Expiration Date: 08/22/2015    Next  appt:  6 mos for annual exam with labs before, diabetic foot exam

## 2014-03-02 ENCOUNTER — Ambulatory Visit: Payer: Medicare Other | Admitting: Internal Medicine

## 2014-03-31 DIAGNOSIS — H2510 Age-related nuclear cataract, unspecified eye: Secondary | ICD-10-CM | POA: Diagnosis not present

## 2014-03-31 DIAGNOSIS — E119 Type 2 diabetes mellitus without complications: Secondary | ICD-10-CM | POA: Diagnosis not present

## 2014-03-31 LAB — HM DIABETES EYE EXAM

## 2014-04-01 ENCOUNTER — Encounter: Payer: Self-pay | Admitting: *Deleted

## 2014-08-05 ENCOUNTER — Other Ambulatory Visit: Payer: Medicare Other

## 2014-08-05 DIAGNOSIS — E119 Type 2 diabetes mellitus without complications: Secondary | ICD-10-CM | POA: Diagnosis not present

## 2014-08-05 DIAGNOSIS — I10 Essential (primary) hypertension: Secondary | ICD-10-CM

## 2014-08-06 LAB — COMPREHENSIVE METABOLIC PANEL
ALT: 17 IU/L (ref 0–32)
AST: 26 IU/L (ref 0–40)
Albumin/Globulin Ratio: 1.2 (ref 1.1–2.5)
Albumin: 4.1 g/dL (ref 3.5–4.8)
Alkaline Phosphatase: 89 IU/L (ref 39–117)
BUN/Creatinine Ratio: 18 (ref 11–26)
BUN: 15 mg/dL (ref 8–27)
CO2: 26 mmol/L (ref 18–29)
Calcium: 9.9 mg/dL (ref 8.7–10.3)
Chloride: 99 mmol/L (ref 97–108)
Creatinine, Ser: 0.82 mg/dL (ref 0.57–1.00)
GFR calc Af Amer: 80 mL/min/{1.73_m2} (ref >59)
GFR calc non Af Amer: 69 mL/min/{1.73_m2} (ref >59)
Globulin, Total: 3.4 g/dL (ref 1.5–4.5)
Glucose: 126 mg/dL — ABNORMAL HIGH (ref 65–99)
Potassium: 4.2 mmol/L (ref 3.5–5.2)
Sodium: 139 mmol/L (ref 134–144)
Total Bilirubin: 0.3 mg/dL (ref 0.0–1.2)
Total Protein: 7.5 g/dL (ref 6.0–8.5)

## 2014-08-06 LAB — CBC WITH DIFFERENTIAL
Basophils Absolute: 0.1 10*3/uL (ref 0.0–0.2)
Basos: 1 %
Eos: 4 %
Eosinophils Absolute: 0.3 10*3/uL (ref 0.0–0.4)
HCT: 42 % (ref 34.0–46.6)
Hemoglobin: 13.9 g/dL (ref 11.1–15.9)
Immature Grans (Abs): 0 10*3/uL (ref 0.0–0.1)
Immature Granulocytes: 0 %
Lymphocytes Absolute: 3.2 10*3/uL — ABNORMAL HIGH (ref 0.7–3.1)
Lymphs: 47 %
MCH: 29.6 pg (ref 26.6–33.0)
MCHC: 33.1 g/dL (ref 31.5–35.7)
MCV: 90 fL (ref 79–97)
Monocytes Absolute: 0.7 10*3/uL (ref 0.1–0.9)
Monocytes: 11 %
Neutrophils Absolute: 2.5 10*3/uL (ref 1.4–7.0)
Neutrophils Relative %: 37 %
Platelets: 158 10*3/uL (ref 150–379)
RBC: 4.69 x10E6/uL (ref 3.77–5.28)
RDW: 13.7 % (ref 12.3–15.4)
WBC: 6.8 10*3/uL (ref 3.4–10.8)

## 2014-08-06 LAB — LIPID PANEL
Chol/HDL Ratio: 4.4 ratio units (ref 0.0–4.4)
Cholesterol, Total: 258 mg/dL — ABNORMAL HIGH (ref 100–199)
HDL: 58 mg/dL (ref >39)
LDL Calculated: 167 mg/dL — ABNORMAL HIGH (ref 0–99)
Triglycerides: 163 mg/dL — ABNORMAL HIGH (ref 0–149)
VLDL Cholesterol Cal: 33 mg/dL (ref 5–40)

## 2014-08-06 LAB — HEMOGLOBIN A1C
Est. average glucose Bld gHb Est-mCnc: 157 mg/dL
Hgb A1c MFr Bld: 7.1 % — ABNORMAL HIGH (ref 4.8–5.6)

## 2014-08-07 ENCOUNTER — Ambulatory Visit (INDEPENDENT_AMBULATORY_CARE_PROVIDER_SITE_OTHER): Payer: Medicare Other | Admitting: Internal Medicine

## 2014-08-07 ENCOUNTER — Encounter: Payer: Self-pay | Admitting: Internal Medicine

## 2014-08-07 VITALS — BP 122/80 | HR 67 | Temp 98.7°F | Ht 65.5 in | Wt 153.6 lb

## 2014-08-07 DIAGNOSIS — E1169 Type 2 diabetes mellitus with other specified complication: Secondary | ICD-10-CM

## 2014-08-07 DIAGNOSIS — E785 Hyperlipidemia, unspecified: Secondary | ICD-10-CM | POA: Diagnosis not present

## 2014-08-07 DIAGNOSIS — I10 Essential (primary) hypertension: Secondary | ICD-10-CM

## 2014-08-07 DIAGNOSIS — E119 Type 2 diabetes mellitus without complications: Secondary | ICD-10-CM

## 2014-08-07 NOTE — Progress Notes (Signed)
Failed clock drawing  

## 2014-08-07 NOTE — Progress Notes (Signed)
Patient ID: Jocelyn Hendrix, female   DOB: 03/25/1935, 79 y.o.   MRN: [id number]   [facility]  [facility]  Code Status: full code  No Known Allergies  Chief Complaint  Patient presents with  . Annual Exam    Yearly check-up, discuss labs (copy printed). MMSE 29/30 (failed clock drawing)   . Immunizations    REFUSED all     HPI: Patient is a 79 y.o. black female seen in the office today for her annual exam.    No new concerns.  All the same.  Still walking for exercise.  Trying to eat right.  Did cheat over Thanksgiving.    Sees Dr. Dione BoozeGroat for her eye exams.    Review of Systems:  Review of Systems  Constitutional: Negative for fever.  HENT: Negative for congestion.   Eyes: Negative for blurred vision.  Respiratory: Negative for shortness of breath.   Cardiovascular: Negative for chest pain and leg swelling.  Gastrointestinal: Negative for abdominal pain, constipation, blood in stool and melena.  Genitourinary: Negative for dysuria.  Musculoskeletal: Negative for myalgias, back pain and falls.  Skin: Negative for rash.  Neurological: Negative for dizziness, loss of consciousness and headaches.  Psychiatric/Behavioral: Negative for depression and memory loss.    Past Medical History  Diagnosis Date  . Hypertension   . Diabetes mellitus   . Hyperlipidemia   . Vitamin D deficiency   . 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 Myalgia and myositis, unspecified     Past Surgical History  Procedure Laterality Date  . 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 Cholecystectomy      Social History:   reports that she has never smoked. She does not have any smokeless tobacco history on file. She reports that she does not drink alcohol or use illicit drugs.  Family History  Problem Relation Age of Onset  . Heart failure Mother   . Diabetes Mother   . Alzheimer's disease Sister      Medications: Patient's Medications  New Prescriptions   No medications on file  Previous Medications   ASPIRIN 81 MG TABLET    Take 81 mg by mouth daily.   BAYER CONTOUR TEST TEST STRIP    TEST SUGAR 1 TO 2 TIMES A DAY(DX 250.00)   LANCETS (ONETOUCH ULTRASOFT) LANCETS    Use as instructed   LISINOPRIL (PRINIVIL,ZESTRIL) 40 MG TABLET    TAKE 1 TABLET BY MOUTH EVERY DAY   METFORMIN (GLUCOPHAGE) 500 MG TABLET    TAKE ONE TABLET BY MOUTH TWO TIMES DAILY FOR DIABETES  Modified Medications   No medications on file  Discontinued Medications   No medications on file     Physical Exam: Filed Vitals:   08/07/14 1007  BP: 122/80  Pulse: 67  Temp: 98.7 F (37.1 C)  TempSrc: Oral  Height: 5' 5.5" (1.664 m)  Weight: 153 lb 9.6 oz (69.673 kg)  SpO2: 97%   Physical Exam  Constitutional: She is oriented to person, place, and time. She appears well-developed and well-nourished. No distress.  HENT:  Head: Normocephalic and atraumatic.  Right Ear: External ear normal.  Left Ear: External ear normal.  Nose: Nose normal.  Mouth/Throat: Oropharynx is clear and moist. No oropharyngeal exudate.  Eyes: Conjunctivae and EOM are normal. Pupils are equal, round, and reactive to light.  glasses  Neck: Normal range of motion. Neck supple. No JVD present.  No thyromegaly present.  Prior tracheostomy scar  Cardiovascular: Normal rate, regular rhythm, normal heart sounds and intact distal pulses.   Pulmonary/Chest: Effort normal and breath sounds normal. No respiratory distress. Right breast exhibits no inverted nipple, no mass, no nipple discharge, no skin change and no tenderness. Left breast exhibits no inverted nipple, no mass, no nipple discharge, no skin change and no tenderness.  Abdominal: Soft. Bowel sounds are normal. She exhibits no distension and no mass. There is no tenderness.  Musculoskeletal: Normal range of motion. She exhibits no edema or tenderness.  Neurological: She is alert and  oriented to person, place, and time.  No DTRs  Skin: Skin is warm and dry.  Psychiatric: She has a normal mood and affect.     Labs reviewed: Basic Metabolic Panel:  Recent Labs  40/98/11 0850 08/05/14 0919  NA 139 139  K 4.6 4.2  CL 100 99  CO2 27 26  GLUCOSE 124* 126*  BUN 18 15  CREATININE 0.89 0.82  CALCIUM 9.9 9.9   Liver Function Tests:  Recent Labs  08/05/14 0919  AST 26  ALT 17  ALKPHOS 89  BILITOT 0.3  PROT 7.5   No results for input(s): LIPASE, AMYLASE in the last 8760 hours. No results for input(s): AMMONIA in the last 8760 hours. CBC:  Recent Labs  02/02/14 0850 08/05/14 0919  WBC 6.9 6.8  NEUTROABS 2.9 2.5  HGB 13.8 13.9  HCT 39.5 42.0  MCV 87 90  PLT  --  158   Lipid Panel:  Recent Labs  02/02/14 0850 08/05/14 0919  HDL 60 58  LDLCALC 152* 167*  TRIG 157* 163*  CHOLHDL 4.1 4.4   Lab Results  Component Value Date   HGBA1C 7.1* 08/05/2014   Assessment/Plan Annual exam: MMSE 29/30, failed clock drawing (surprised me) given her clear cognitive state and excellent function PHQ9 negative for depression No falls and not a high risk Refuses all vaccinations due to her h/o Guillain Barre syndrome Does regular breast exams and exam today benign No longer gets paps Agrees to cologuard colon cancer screening--provided today Had eye exam in past year with Dr Dione Booze and has upcoming appointment (I don't have note) Diabetic foot exam done today Walks and eats a healthy diet Last mammogram 11/11/12--next due 11/2014--order at next visit if she does not get it done on her own  1. Essential hypertension, benign - bp at goal with ace alone and walking - Comprehensive metabolic panel; Future  2. Diabetes mellitus type 2, uncomplicated - cont ace, urine microalbumin not needed due to already on ace, cont asa, cont metformin - CBC With differential/Platelet; Future - Comprehensive metabolic panel; Future - Hemoglobin A1c; Future - Lipid  panel; Future  3. Hyperlipidemia associated with type 2 diabetes mellitus -refuses cholesterol medication - Lipid panel; Future  Labs/tests ordered:  Orders Placed This Encounter  Procedures  . CBC With differential/Platelet    Standing Status: Future     Number of Occurrences:      Standing Expiration Date: 08/08/2015  . Comprehensive metabolic panel    Standing Status: Future     Number of Occurrences:      Standing Expiration Date: 08/08/2015    Order Specific Question:  Has the patient fasted?    Answer:  Yes  . Hemoglobin A1c    Standing Status: Future     Number of Occurrences:      Standing Expiration Date: 08/08/2015  . Lipid panel    Standing  Status: Future     Number of Occurrences:      Standing Expiration Date: 08/08/2015    Order Specific Question:  Has the patient fasted?    Answer:  Yes    Next appt:  6 mos with labs before  Franciso Dierks L. Nashid Pellum, D.O. Geriatrics [facility] [address] [place], [place] [address] Cell Phone (Mon-Fri 8am-5pm):  [phone] On Call:  [phone] & follow prompts after 5pm & weekends Office Phone:  [phone] Office Fax:  [phone]

## 2014-08-18 DIAGNOSIS — Z1211 Encounter for screening for malignant neoplasm of colon: Secondary | ICD-10-CM | POA: Diagnosis not present

## 2014-08-18 DIAGNOSIS — Z1212 Encounter for screening for malignant neoplasm of rectum: Secondary | ICD-10-CM | POA: Diagnosis not present

## 2014-08-18 LAB — FECAL OCCULT BLOOD, GUAIAC: Fecal Occult Blood: NEGATIVE

## 2014-08-18 LAB — COLOGUARD: Cologuard: NEGATIVE

## 2014-08-25 ENCOUNTER — Other Ambulatory Visit: Payer: Self-pay | Admitting: Internal Medicine

## 2014-08-27 ENCOUNTER — Telehealth: Payer: Self-pay

## 2014-08-27 NOTE — Telephone Encounter (Signed)
Spoke with patient, patient aware Cologuard (colorectal cancer screening test) was negative.

## 2014-09-24 ENCOUNTER — Other Ambulatory Visit: Payer: Self-pay | Admitting: Internal Medicine

## 2014-10-27 ENCOUNTER — Other Ambulatory Visit: Payer: Self-pay | Admitting: Internal Medicine

## 2014-11-10 ENCOUNTER — Other Ambulatory Visit: Payer: Self-pay | Admitting: *Deleted

## 2014-11-10 MED ORDER — LISINOPRIL 40 MG PO TABS: ORAL_TABLET | ORAL | Status: DC

## 2014-11-10 MED ORDER — METFORMIN HCL 500 MG PO TABS: ORAL_TABLET | ORAL | Status: DC

## 2014-11-10 NOTE — Telephone Encounter (Signed)
Patient going out of town for 2 months and need enough of medication to last until she comes back. 90 day supply faxed to pharmacy. Patient will call when she comes back to schedule an appointment.

## 2014-11-18 ENCOUNTER — Emergency Department (HOSPITAL_COMMUNITY)
Admission: EM | Admit: 2014-11-18 | Discharge: 2014-11-18 | Disposition: A | Discharge status: Home / Self Care | Payer: Medicare Other | Attending: Emergency Medicine | Admitting: Emergency Medicine

## 2014-11-18 ENCOUNTER — Encounter (HOSPITAL_COMMUNITY): Payer: Self-pay

## 2014-11-18 DIAGNOSIS — H9202 Otalgia, left ear: Secondary | ICD-10-CM | POA: Insufficient documentation

## 2014-11-18 DIAGNOSIS — I10 Essential (primary) hypertension: Secondary | ICD-10-CM | POA: Diagnosis not present

## 2014-11-18 DIAGNOSIS — Z7982 Long term (current) use of aspirin: Secondary | ICD-10-CM | POA: Insufficient documentation

## 2014-11-18 DIAGNOSIS — Z8739 Personal history of other diseases of the musculoskeletal system and connective tissue: Secondary | ICD-10-CM | POA: Diagnosis not present

## 2014-11-18 DIAGNOSIS — E119 Type 2 diabetes mellitus without complications: Secondary | ICD-10-CM | POA: Diagnosis not present

## 2014-11-18 DIAGNOSIS — Z79899 Other long term (current) drug therapy: Secondary | ICD-10-CM | POA: Diagnosis not present

## 2014-11-18 DIAGNOSIS — Z8669 Personal history of other diseases of the nervous system and sense organs: Secondary | ICD-10-CM | POA: Diagnosis not present

## 2014-11-18 NOTE — ED Notes (Signed)
Marissa,PA at the bedside.  

## 2014-11-18 NOTE — Discharge Instructions (Signed)
Please call your doctor for a followup appointment within 24-48 hours. When you talk to your doctor please let them know that you were seen in the emergency department and have them acquire all of your records so that they can discuss the findings with you and formulate a treatment plan to fully care for your new and ongoing problems. Please call and set-up an appointment with your primary care provider to be re-assessed this week Please call and set-up an appointment with Ear, Nose, and Throat physician  Please rest and stay hydrated Please do not use Qtips - please use rag to wash around ears Please continue to monitor symptoms closely and if symptoms are to worsen or change (fever greater than 101, chills, sweating, nausea, vomiting, chest pain, shortness of breathe, difficulty breathing, weakness, numbness, tingling, worsening or changes to pain pattern, swelling to the ears, difficulty swallowing, pain with opening closing the jaw line, ringing of the ears, muffled sounds, difficulty caring, dizziness, blurred vision, difficulty walking, feeling unsteady with walking) please report back to the Emergency Department immediately.   Otalgia The most common reason for this in children is an infection of the middle ear. Pain from the middle ear is usually caused by a build-up of fluid and pressure behind the eardrum. Pain from an earache can be sharp, dull, or burning. The pain may be temporary or constant. The middle ear is connected to the nasal passages by a short narrow tube called the Eustachian tube. The Eustachian tube allows fluid to drain out of the middle ear, and helps keep the pressure in your ear equalized. CAUSES  A cold or allergy can block the Eustachian tube with inflammation and the build-up of secretions. This is especially likely in small children, because their Eustachian tube is shorter and more horizontal. When the Eustachian tube closes, the normal flow of fluid from the middle ear  is stopped. Fluid can accumulate and cause stuffiness, pain, hearing loss, and an ear infection if germs start growing in this area. SYMPTOMS  The symptoms of an ear infection may include fever, ear pain, fussiness, increased crying, and irritability. Many children will have temporary and minor hearing loss during and right after an ear infection. Permanent hearing loss is rare, but the risk increases the more infections a child has. Other causes of ear pain include retained water in the outer ear canal from swimming and bathing. Ear pain in adults is less likely to be from an ear infection. Ear pain may be referred from other locations. Referred pain may be from the joint between your jaw and the skull. It may also come from a tooth problem or problems in the neck. Other causes of ear pain include:  A foreign body in the ear.  Outer ear infection.  Sinus infections.  Impacted ear wax.  Ear injury.  Arthritis of the jaw or TMJ problems.  Middle ear infection.  Tooth infections.  Sore throat with pain to the ears. DIAGNOSIS  Your caregiver can usually make the diagnosis by examining you. Sometimes other special studies, including x-rays and lab work may be necessary. TREATMENT   If antibiotics were prescribed, use them as directed and finish them even if you or your child's symptoms seem to be improved.  Sometimes PE tubes are needed in children. These are little plastic tubes which are put into the eardrum during a simple surgical procedure. They allow fluid to drain easier and allow the pressure in the middle ear to equalize. This helps  relieve the ear pain caused by pressure changes. HOME CARE INSTRUCTIONS   Only take over-the-counter or prescription medicines for pain, discomfort, or fever as directed by your caregiver. DO NOT GIVE CHILDREN ASPIRIN because of the association of Reye's Syndrome in children taking aspirin.  Use a cold pack applied to the outer ear for 15-20 minutes,  03-04 times per day or as needed may reduce pain. Do not apply ice directly to the skin. You may cause frost bite.  Over-the-counter ear drops used as directed may be effective. Your caregiver may sometimes prescribe ear drops.  Resting in an upright position may help reduce pressure in the middle ear and relieve pain.  Ear pain caused by rapidly descending from high altitudes can be relieved by swallowing or chewing gum. Allowing infants to suck on a bottle during airplane travel can help.  Do not smoke in the house or near children. If you are unable to quit smoking, smoke outside.  Control allergies. SEEK IMMEDIATE MEDICAL CARE IF:   You or your child are becoming sicker.  Pain or fever relief is not obtained with medicine.  You or your child's symptoms (pain, fever, or irritability) do not improve within 24 to 48 hours or as instructed.  Severe pain suddenly stops hurting. This may indicate a ruptured eardrum.  You or your children develop new problems such as severe headaches, stiff neck, difficulty swallowing, or swelling of the face or around the ear. Document Released: 04/07/2004 Document Revised: 11/13/2011 Document Reviewed: 08/12/2008 [facility] Patient Information 2015 [place], [place]. This information is not intended to replace advice given to you by your health care provider. Make sure you discuss any questions you have with your health care provider.   Emergency Department Resource Guide 1) Find a Doctor and Pay Out of Pocket Although you won't have to find out who is covered by your insurance plan, it is a good idea to ask around and get recommendations. You will then need to call the office and see if the doctor you have chosen will accept you as a new patient and what types of options they offer for patients who are self-pay. Some doctors offer discounts or will set up payment plans for their patients who do not have insurance, but you will need to ask so you aren't  surprised when you get to your appointment.  2) Contact Your Local Health Department Not all health departments have doctors that can see patients for sick visits, but many do, so it is worth a call to see if yours does. If you don't know where your local health department is, you can check in your phone book. The CDC also has a tool to help you locate your state's health department, and many state websites also have listings of all of their local health departments.  3) Find a Walk-in Clinic If your illness is not likely to be very severe or complicated, you may want to try a walk in clinic. These are popping up all over the country in pharmacies, drugstores, and shopping centers. They're usually staffed by nurse practitioners or physician assistants that have been trained to treat common illnesses and complaints. They're usually fairly quick and inexpensive. However, if you have serious medical issues or chronic medical problems, these are probably not your best option.  No Primary Care Doctor: - Call Health Connect at  [phone] - they can help you locate a primary care doctor that  accepts your insurance, provides certain services, etc. - Physician  Referral Service- [phone]  Chronic Pain Problems: Organization         Address  Phone   Notes  [facility]  [phone] Patients need to be referred by their primary care doctor.   Medication Assistance: Organization         Address  Phone   Notes  [facility] Medication [facility] [address] [place] [place], [place] [address] [phone] --Must be a resident of [facility] -- Must have NO insurance coverage whatsoever (no Medicaid/ Medicare, etc.) -- The pt. MUST have a primary care doctor that directs their care regularly and follows them in the community   [facility]  [phone]   [employer]  [phone]    Agencies that provide inexpensive medical care: Organization          Address  Phone   Notes  [facility]  [phone]   [facility]    [phone]   [facility] [address] [place], [place] [address] [phone]   [facility] [address] [place] [place] [phone]   Planned Parenthood    [phone]   [facility]    [phone]   [facility]  [address], [place] Phone:  [phone], Fax:  [phone] Hours of Operation:  9 am - 6 pm, M-F.  Also accepts Medicaid/Medicare and self-pay.  [facility]  [address], [place] Phone: [phone], Fax: [phone]. Hours of Operation:  8:30 am - 5:30 pm, M-F.  Also accepts Medicaid and self-pay.  [facility] [place] [address] [place] Phone: [phone]   [facility] [address] Natasha Bence [place], [place] [phone], Ext. [phone] Mondays & Thursdays: 7-9 AM.  First 15 patients are seen on a first come, first serve basis.    Medicaid-accepting [facility] Providers:  Organization         Address  Phone   Notes  [facility] [address], [place] [phone] Also accepts self-pay patients.  [facility] [address] Laurell Josephs [place], [place]  [phone]   [facility] [address], [place] [phone]   [facility] [address], [place] [phone]   Renaye Rakers [address], [place]   [phone] Only accepts [place] Access [place] patients after they have their name applied to their card.   Self-Pay (no insurance) in [facility]:  Organization         Address  Phone   Notes  Sickle Cell Patients, [facility] [address] [place], [place] [phone]   [facility] [address] [place], [place] [phone]     [facility] [place]  [address], [place] [phone]   [facility]/Dr. Osei-Bonsu  [address] [place] or [address], [place] [phone] Phone number for both [place] and [place] locations is the same.  [facility] [address], [place] [phone]   [facility] [address], [place] or [address] [phone]   [facility] [address] [place], [place] [phone],  phone; [phone], fax Sees patients 1st and 3rd Saturday of every month.  Must not qualify for public or private insurance (i.e. Medicaid, Medicare, [place] Health Choice, Veterans' Benefits)  Household income should be no more than 200% of the poverty level The clinic cannot treat you if you are pregnant or think you are pregnant  Sexually transmitted diseases are not treated at the clinic.    Dental Care: Organization         Address  Phone  Notes  [facility] [address], [place] [phone]) [phone] Accepts children up to age 78 who are enrolled in [place] or [place] Health Choice; pregnant women with a Medicaid card; and children who have applied for Medicaid or [place] Health Choice, but were declined, whose parents can pay a reduced fee at time of service.  [facility] [place]  [address], [place] [phone]) [phone] Accepts children up to age 78 who are enrolled in [place] or [place] Health Choice; pregnant women with a Medicaid card; and children who have applied for Medicaid or [place] Health Choice, but were declined, whose parents can pay a reduced fee at time of service.  [facility]  [address], [place] [phone]) [phone] Patients are seen by appointment only. Walk-ins are not accepted. [facility] will see patients 4118  years of age and older. Monday - Tuesday (8am-5pm) Most Wednesdays (8:30-5pm) $30 per visit, cash only  [facility]  [address], [facility] [place] [phone]) [phone] Patients are seen by appointment only. Walk-ins are not accepted. [facility] will see patients 78 years of age and older. One Wednesday Evening (Monthly: Volunteer Based).  $30 per visit, cash only  [employer] [facility]  [phone] for adults; Children under age 654, call Graduate Pediatric Dentistry at [phone]. Children aged 754-14, please call [phone] to request a pediatric application.  Dental services are provided in all areas of dental care including fillings, crowns and bridges, complete and partial dentures, implants, gum treatment, root canals, and extractions. Preventive care is also provided. Treatment is provided to both adults and children. Patients are selected via a lottery and there is often a waiting list.   [facility] [address], [place]  [phone] [email or web address]   [facility] [address], [place], [place] [phone], Ext. [phone] Second and Fourth Thursday of each month, opens at 6:30 AM; Clinic ends at 9 AM.  Patients are seen on a first-come first-served basis, and a limited number are seen during each clinic.   [facility]  [address], [place], [place] [phone]) [phone]   Eligibility Requirements You must have lived in [place], [place], or [place] counties for at least the last three months.   You cannot be eligible for state or federal sponsored [place]healthcare insurance, including CIGNAVeterans Administration, [place]icaid, or [employer]icare.   You generally cannot be eligible for healthcare insurance through your employer.    How to apply: Eligibility screenings are held every Tuesday and Wednesday afternoon from 1:00 pm until 4:00 pm. You do not need an appointment for the interview!  [facility]  [address], [place], [place] [phone]   [facility]  [phone]   [facility]  [phone]   [facility]  [phone]    Behavioral Health Resources in  the Community: Intensive Outpatient Programs Organization         Address  Phone  Notes  [facility] [address] [place], [place] [phone]   [facility] Outpatient [address], [place], [place] [phone]   [facility] [address], [place], [place]  [phone]   [facility] Mental Health [address],  [place], [place] [phone] or [phone]   Substance Abuse Resources Organization         Address  Phone  Notes  [facility] Drug [facility]  [phone]   [facility]  [phone]   [facility] [employer]  [phone]   Floydene Flock  [phone]   Residential & Outpatient Substance Abuse Program  [phone]   Psychological Services Organization         Address  Phone  Notes  [facility]  [phone]   [facility] Services  [phone]   [facility] [address] [employer] [phone] or [phone]    Mobile Crisis Teams Organization         Address  Phone  Notes  [facility], Mobile Crisis Care Unit  [phone]   [facility]  [address] [place], [place] [phone]   Doristine Locks [address] [place] [phone]    Self-Help/Support Groups Organization         Address  Phone             Notes  [facility] [place] - variety of support groups  [phone]- I7437963 Call for more information  Narcotics Anonymous (NA), [facility] [address], [employer] [place]  2 meetings at this location   Statistician         Address  Phone  Notes  [facility] [address],    [place]   [phone]   [facility]  [address], [place] [address]242, [place], [place] [phone]   [facility] [address] [place], [place] [phone] Admissions: 8am-3pm M-F  [facility] [address],    [place], [place] [phone]   [facility] [address] [place], [place], [place] [phone]   [facility] [address],  [place], [place] [phone]   [facility] - Intensive Outpatient [address] Laurell Josephs [address], [place], [place] [phone]   [facility] ([facility] [address] [place]  [place], [place] [phone] or [phone]   [facility] ([facility]) [address], [place], [place] [phone] Accepts Medicaid  [facility] [place] [address],  [place] [phone] Substance Abuse/Addiction Treatment   [facility] Organization         Address  Phone  Notes  [facility]  [phone]   Angie Fava, PhD [address] Ervin Knack [place], [place]   [phone] or [phone]   [facility] Behavioral   [address] [place], [place] [phone]   [facility] [address], [place], [place] [phone] Insurance/Medicaid/sponsorship through [employer] and Families [address]                                    [place], [place] [phone] Therapy/tele-psych/case  [facility] [address], [place] [phone]  Dr. Adele Schilder  [phone]   [facility] Dept. 1) [address], [place] 2) [place] 3)  [place] [address], [place] [phone]  [phone]   [place] [phone] or [phone] (After Hours)

## 2014-11-18 NOTE — ED Provider Notes (Signed)
CSN: [id number]     Arrival date & time 11/18/14  0753 History   First MD Initiated Contact with Patient 11/18/14 0755     Chief Complaint  Patient presents with  . Otalgia     (Consider location/radiation/quality/duration/timing/severity/associated sxs/prior Treatment) The history is provided by the patient. No language interpreter was used.  Jocelyn Hendrix is a 80 y/o F with PMHx of HTN, DM, HLD, vitamin D deficiency, myalgia, myositis presenting to the ED with left ear pain that started on Sunday. Patient reported that she was washing her hair on Saturday when water got into her left ear. Patient reported that on Sunday she started to experience a throbbing, dull aching pain to her left ear radiating to her upper jaw that has been constant. Stated that this morning when she woke up her right ear popped in the morning. Denied fever, chills, drainage, swelling, difficulty swallowing, sore throat, blurred vision, sudden loss of vision, headache, dizziness, gait issues, chest pain, shortness of breath, difficulty breathing, nasal congestion, cough, hemoptysis. PCP Dr. Nicholos Johns  Past Medical History  Diagnosis Date  . Hypertension   . Diabetes mellitus   . Hyperlipidemia   . Vitamin D deficiency   . 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 Myalgia and myositis, unspecified    Past Surgical History  Procedure Laterality Date  . 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 Heart failure Mother   . Diabetes Mother   . Alzheimer's disease Sister    History  Substance Use Topics  . Smoking status: Never Smoker   . Smokeless tobacco: Not on file  . Alcohol Use: No   OB History    No data available     Review of Systems  Constitutional: Negative for fever and chills.  HENT: Positive for ear pain (left). Negative for congestion, ear discharge, facial swelling, hearing loss, rhinorrhea, sinus  pressure, sore throat, tinnitus and trouble swallowing.   Eyes: Negative for visual disturbance.  Respiratory: Negative for chest tightness and shortness of breath.   Gastrointestinal: Negative for nausea and vomiting.  Musculoskeletal: Negative for neck pain and neck stiffness.  Neurological: Negative for dizziness, weakness, numbness and headaches.      Allergies  Review of patient's allergies indicates no known allergies.  Home Medications   Prior to Admission medications   Medication Sig Start Date End Date Taking? Authorizing Provider  aspirin 81 MG tablet Take 81 mg by mouth daily.    Historical Provider, MD  BAYER CONTOUR TEST test strip TEST SUGAR 1 TO 2 TIMES A DAY(DX 250.00)    Kimber Relic, MD  Lancets [facility] ULTRASOFT) lancets USE AS INSTRUCTED (1-2 TIMES DAILY) 10/28/14   Tiffany L Reed, DO  lisinopril (PRINIVIL,ZESTRIL) 40 MG tablet Take one tablet by mouth once daily for blood pressure 11/10/14   Tiffany L Reed, DO  metFORMIN (GLUCOPHAGE) 500 MG tablet Take one tablet by mouth twice daily to control blood sugar 11/10/14   Tiffany L Reed, DO   BP 160/69 mmHg  Pulse 78  Temp(Src) 98.8 F (37.1 C) (Oral)  Resp 16  Ht  (1.651 m)  Wt 154 lb (69.854 kg)  BMI 25.63 kg/m2  SpO2 98% Physical Exam  Constitutional: She is oriented to person, place, and time. She appears well-developed and well-nourished. No distress.  HENT:  Head: Normocephalic and atraumatic.  Right Ear: Hearing, external ear  and ear canal normal. No lacerations. No drainage, swelling or tenderness. No foreign bodies. No mastoid tenderness. Tympanic membrane is not injected, not scarred, not perforated, not erythematous, not retracted and not bulging. No middle ear effusion.  Left Ear: Hearing, tympanic membrane, external ear and ear canal normal. No lacerations. No drainage, swelling or tenderness. No foreign bodies. No mastoid tenderness. Tympanic membrane is not injected, not scarred, not perforated,  not erythematous, not retracted and not bulging.  No middle ear effusion.  Mouth/Throat: Oropharynx is clear and moist. No oropharyngeal exudate.  Negative trismus  Right ear: TM noted to have redness - not appearing to be infectious.   Eyes: Conjunctivae and EOM are normal. Pupils are equal, round, and reactive to light. Right eye exhibits no discharge. Left eye exhibits no discharge.  Neck: Normal range of motion. Neck supple. No tracheal deviation present.  Negative neck stiffness Negative nuchal rigidity  Negative cervical lymphadenopathy   Cardiovascular: Normal rate, regular rhythm and normal heart sounds.  Exam reveals no friction rub.   No murmur heard. Pulmonary/Chest: Effort normal and breath sounds normal. No respiratory distress. She has no wheezes. She has no rales.  Musculoskeletal: Normal range of motion.  Lymphadenopathy:    She has no cervical adenopathy.  Neurological: She is alert and oriented to person, place, and time. No cranial nerve deficit. She exhibits normal muscle tone. Coordination normal. GCS eye subscore is 4. GCS verbal subscore is 5. GCS motor subscore is 6.  Cranial nerves III-XII grossly intact Strength 5+/5+ to upper and lower extremities bilaterally with resistance applied, equal distribution noted Equal grip strength  Negative facial droop Negative slurred speech  Negative aphasia Negative arm drift Fine motor skills intact Patient follows commands well  Patient responds to questions appropriately   Skin: Skin is warm and dry. No rash noted. She is not diaphoretic. No erythema.  Psychiatric: She has a normal mood and affect. Her behavior is normal. Thought content normal.  Nursing note and vitals reviewed.   ED Course  Procedures (including critical care time) Labs Review Labs Reviewed - No data to display  Imaging Review No results found.   EKG Interpretation None       8:59 AM patient seen and assessed by attending physician, Dr.  Tilden FossaElizabeth Rees. As per physician recommends patient to be discharged. Negative findings of stroke, cardiac issue, dissection. Recommended referral to PCP and ear nose and throat physician.  MDM   Final diagnoses:  Otalgia of left ear    Medications - No data to display  Filed Vitals:   11/18/14 0801  BP: 160/69  Pulse: 78  Temp: 98.8 F (37.1 C)  TempSrc: Oral  Resp: 16  Height: 5\' 5"  (1.651 m)  Weight: 154 lb (69.854 kg)  SpO2: 98%   Patient presenting to the ED with left ear discomfort starting Sunday. Unremarkable ear examination - mild hint of redness to the right ear, does not appear to be infectious. Negative swelling to the ear or neck. Negative pre-or postauricular adenopathy. Negative mastoid tenderness or swelling. Negative trismus on examination.  Doubt mastoiditis. Doubt otitis externa or media. Doubt dissection. Doubt cardiac or neurological issue. Patient seen and assessed by attending physician who agrees to plan of discharge. Patient stable, afebrile. Patient not septic appearing. Discharged patient. Referred patient to PCP and ENT. Discussed with patient to rest and stay hydrated. Discussed with patient to avoid Q-tips. Discussed with patient to closely monitor symptoms and if symptoms are to worsen or change  to report back to the ED - strict return instructions given.  Patient agreed to plan of care, understood, all questions answered.   Raymon Mutton, PA-C 11/18/14 0912  Raymon Mutton, PA-C 11/18/14 0913  Tilden Fossa, MD 11/18/14 [phone]

## 2014-11-18 NOTE — ED Notes (Signed)
Pt here for left earache that started bothering her Sunday. Had washed her hair the other day and water had gotten in her ear.

## 2014-12-03 ENCOUNTER — Telehealth: Payer: Self-pay | Admitting: Internal Medicine

## 2014-12-03 NOTE — Telephone Encounter (Signed)
Ok.  Glad the ear pain is better.  I will see her when she gets back from her trip.

## 2014-12-03 NOTE — Telephone Encounter (Signed)
I called Jocelyn Hendrix to see if she was still having ear pain. Mrs. Jocelyn Hendrix stated the pain was coming from a bad tooth in the back of her mouth. She had the tooth removed and now has had no more ear pain. Mrs. Jocelyn Hendrix wants you to know she will be going out of town on Sunday and will be gone for 2 months.

## 2015-01-27 ENCOUNTER — Other Ambulatory Visit: Payer: Medicare Other

## 2015-01-27 DIAGNOSIS — E1169 Type 2 diabetes mellitus with other specified complication: Secondary | ICD-10-CM

## 2015-01-27 DIAGNOSIS — E785 Hyperlipidemia, unspecified: Secondary | ICD-10-CM

## 2015-01-27 DIAGNOSIS — E119 Type 2 diabetes mellitus without complications: Secondary | ICD-10-CM

## 2015-01-27 DIAGNOSIS — I10 Essential (primary) hypertension: Secondary | ICD-10-CM | POA: Diagnosis not present

## 2015-01-28 LAB — CBC WITH DIFFERENTIAL
Basophils Absolute: 0.1 10*3/uL (ref 0.0–0.2)
Basos: 1 %
EOS (ABSOLUTE): 0.3 10*3/uL (ref 0.0–0.4)
Eos: 4 %
Hematocrit: 39.5 % (ref 34.0–46.6)
Hemoglobin: 13.4 g/dL (ref 11.1–15.9)
Immature Grans (Abs): 0 10*3/uL (ref 0.0–0.1)
Immature Granulocytes: 0 %
Lymphocytes Absolute: 2.6 10*3/uL (ref 0.7–3.1)
Lymphs: 45 %
MCH: 30 pg (ref 26.6–33.0)
MCHC: 33.9 g/dL (ref 31.5–35.7)
MCV: 88 fL (ref 79–97)
Monocytes Absolute: 0.6 10*3/uL (ref 0.1–0.9)
Monocytes: 10 %
Neutrophils Absolute: 2.4 10*3/uL (ref 1.4–7.0)
Neutrophils: 40 %
RBC: 4.47 x10E6/uL (ref 3.77–5.28)
RDW: 13.9 % (ref 12.3–15.4)
WBC: 5.9 10*3/uL (ref 3.4–10.8)

## 2015-01-28 LAB — COMPREHENSIVE METABOLIC PANEL
ALT: 13 IU/L (ref 0–32)
AST: 21 IU/L (ref 0–40)
Albumin/Globulin Ratio: 1.1 (ref 1.1–2.5)
Albumin: 3.8 g/dL (ref 3.5–4.8)
Alkaline Phosphatase: 80 IU/L (ref 39–117)
BUN/Creatinine Ratio: 22 (ref 11–26)
BUN: 18 mg/dL (ref 8–27)
Bilirubin Total: 0.2 mg/dL (ref 0.0–1.2)
CO2: 25 mmol/L (ref 18–29)
Calcium: 9.4 mg/dL (ref 8.7–10.3)
Chloride: 100 mmol/L (ref 97–108)
Creatinine, Ser: 0.81 mg/dL (ref 0.57–1.00)
GFR calc Af Amer: 81 mL/min/{1.73_m2} (ref >59)
GFR calc non Af Amer: 70 mL/min/{1.73_m2} (ref >59)
Globulin, Total: 3.4 g/dL (ref 1.5–4.5)
Glucose: 112 mg/dL — ABNORMAL HIGH (ref 65–99)
Potassium: 4.2 mmol/L (ref 3.5–5.2)
Sodium: 138 mmol/L (ref 134–144)
Total Protein: 7.2 g/dL (ref 6.0–8.5)

## 2015-01-28 LAB — LIPID PANEL
Chol/HDL Ratio: 3.6 ratio units (ref 0.0–4.4)
Cholesterol, Total: 196 mg/dL (ref 100–199)
HDL: 54 mg/dL (ref >39)
LDL Calculated: 111 mg/dL — ABNORMAL HIGH (ref 0–99)
Triglycerides: 156 mg/dL — ABNORMAL HIGH (ref 0–149)
VLDL Cholesterol Cal: 31 mg/dL (ref 5–40)

## 2015-01-28 LAB — HEMOGLOBIN A1C
Est. average glucose Bld gHb Est-mCnc: 143 mg/dL
Hgb A1c MFr Bld: 6.6 % — ABNORMAL HIGH (ref 4.8–5.6)

## 2015-01-29 ENCOUNTER — Ambulatory Visit (INDEPENDENT_AMBULATORY_CARE_PROVIDER_SITE_OTHER): Payer: Medicare Other | Admitting: Internal Medicine

## 2015-01-29 ENCOUNTER — Encounter: Payer: Self-pay | Admitting: Internal Medicine

## 2015-01-29 VITALS — BP 150/82 | HR 67 | Temp 97.7°F | Resp 18 | Ht 65.0 in | Wt 152.4 lb

## 2015-01-29 DIAGNOSIS — M8580 Other specified disorders of bone density and structure, unspecified site: Secondary | ICD-10-CM | POA: Diagnosis not present

## 2015-01-29 DIAGNOSIS — I10 Essential (primary) hypertension: Secondary | ICD-10-CM | POA: Diagnosis not present

## 2015-01-29 DIAGNOSIS — E119 Type 2 diabetes mellitus without complications: Secondary | ICD-10-CM

## 2015-01-29 DIAGNOSIS — E1169 Type 2 diabetes mellitus with other specified complication: Secondary | ICD-10-CM

## 2015-01-29 DIAGNOSIS — E785 Hyperlipidemia, unspecified: Secondary | ICD-10-CM

## 2015-01-29 DIAGNOSIS — Z780 Asymptomatic menopausal state: Secondary | ICD-10-CM

## 2015-01-29 NOTE — Progress Notes (Signed)
Patient ID: Jocelyn Hendrix, female   DOB: 1935/04/05, 80 y.o.   MRN: [id number]   Location:  [facility]  Chief Complaint  Patient presents with  . Medical Management of Chronic Issues    HPI: Patient is a 80 y.o. black female seen in the office today for med mgt of chronic diseases.  She is doing very well.  [place] got [place] to [place]  Had grandchild graduate.  Could not eat her regular diet there.  Has lost 2 lbs.  LDL is coming down.  Hba1c down also.    Says she has a lot of bowel sounds and gets hungry and stomach is very loud.    Got right back to her walking routine since 5/16 and did a lot of walking while there.  Due for her eye appt in July. No foot problems.   Sleeping well.  Bowels are moving well.  No urge incontinence.    Review of Systems:  Review of Systems  Constitutional: Positive for weight loss. Negative for fever, chills and malaise/fatigue.  Eyes: Negative for blurred vision.  Respiratory: Negative for shortness of breath.   Cardiovascular: Negative for chest pain and leg swelling.  Gastrointestinal: Negative for abdominal pain, constipation, blood in stool and melena.       No additional diverticulitis episodes  Genitourinary: Negative for dysuria, urgency and frequency.  Musculoskeletal: Negative for falls.  Neurological: Negative for dizziness and loss of consciousness.  Psychiatric/Behavioral: Negative for depression and memory loss.    Past Medical History  Diagnosis Date  . Hypertension   . Diabetes mellitus   . Hyperlipidemia   . Vitamin D deficiency   . 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 Myalgia and myositis, unspecified     Past Surgical History  Procedure Laterality Date  . 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   BAYER CONTOUR TEST TEST STRIP    TEST SUGAR 1 TO 2 TIMES A DAY(DX 250.00)   LANCETS (ONETOUCH ULTRASOFT) LANCETS    USE AS INSTRUCTED (1-2 TIMES DAILY)   LISINOPRIL (PRINIVIL,ZESTRIL) 40 MG TABLET    Take one tablet by mouth once daily for blood pressure   METFORMIN (GLUCOPHAGE) 500 MG TABLET    Take one tablet by mouth twice daily to control blood sugar  Modified Medications   No medications on file  Discontinued Medications   No medications on file    Physical Exam: Filed Vitals:   01/29/15 1139  BP: 150/82  Pulse: 67  Temp: 97.7 F (36.5 C)  TempSrc: Oral  Resp: 18  Height: 5\' 5"  (1.651 m)  Weight: 152 lb 6.4 oz (69.128 kg)  SpO2: 97%   Physical Exam  Constitutional: She is oriented to person, place, and time. She appears well-developed and well-nourished. No distress.  Cardiovascular: Normal rate, regular rhythm, normal heart sounds and intact distal pulses.   Pulmonary/Chest: Effort normal and breath sounds normal. No respiratory distress.  Abdominal: Soft. Bowel sounds are normal.  Musculoskeletal: Normal range of motion. She exhibits no edema or tenderness.  Neurological: She is alert and oriented to person, place, and time.  Skin: Skin is warm and dry.  Psychiatric: She has a  normal mood and affect.    Labs reviewed: Basic Metabolic Panel:  Recent Labs  16/10/96 0850 08/05/14 0919 01/27/15 0841  NA 139 139 138  K 4.6 4.2 4.2  CL 100 99 100  CO2 GLUCOSE 124* 126* 112*  BUN CREATININE 0.89 0.82 0.81  CALCIUM 9.9 9.9 9.4   Liver Function Tests:  Recent Labs  08/05/14 0919 01/27/15 0841  AST 26 21  ALT 17 13  ALKPHOS 89 80  BILITOT 0.3 0.2  PROT 7.5 7.2   No results for input(s): LIPASE, AMYLASE in the last 8760 hours. No results for input(s): AMMONIA in the last 8760 hours. CBC:  Recent Labs  02/02/14 0850 08/05/14 0919 01/27/15 0841  WBC 6.9  6.8 5.9  NEUTROABS 2.9 2.5 2.4  HGB 13.8 13.9  --   HCT 39.5 42.0 39.5  MCV 87 90  --   PLT  --  158  --    Lipid Panel:  Recent Labs  02/02/14 0850 08/05/14 0919 01/27/15 0841  CHOL 243* 258* 196  HDL 60 58 54  LDLCALC 152* 167* 111*  TRIG 157* 163* 156*  CHOLHDL 4.1 4.4 3.6   Lab Results  Component Value Date   HGBA1C 6.6* 01/27/2015   Assessment/Plan 1. Essential hypertension, benign -bp at goal with lisinopril, diet and exercise  2. Diabetes mellitus type 2, uncomplicated -hba1c at goal with ace, asa, metformin, diet and exercise  3. Hyperlipidemia associated with type 2 diabetes mellitus -did not tolerate statin therapy -lipids improved since last time with LDL down to 111 from 167; TG down to 156 from 163 with diet and exercise  4. Osteopenia -on previous bone density in 2013 -strangely this diagnosis does not cover a repeat--only postmenopausal estrogen deficiency will work -she has stopped her calcium (and is not on vitamin D either for some reason though I have recommended it)--also using Vitamin D therapy - DG Bone Density; Future  5. Postmenopausal estrogen deficiency - DG Bone Density; Future  Labs/tests ordered:   Orders Placed This Encounter  Procedures  . DG Bone Density    Standing Status: Future     Number of Occurrences:      Standing Expiration Date: 03/30/2016    Order Specific Question:  Reason for Exam (SYMPTOM  OR DIAGNOSIS REQUIRED)    Answer:  osteopenia in 2013; not on calcium with D, does walk regularly    Order Specific Question:  Preferred imaging location?    Answer:  [facility]  . Basic metabolic panel    Standing Status: Future     Number of Occurrences:      Standing Expiration Date: 01/29/2016    Order Specific Question:  Has the patient fasted?    Answer:  Yes  . Hemoglobin A1c    Standing Status: Future     Number of Occurrences:      Standing Expiration Date: 01/29/2016  . Lipid panel    Standing Status: Future       Number of Occurrences:      Standing Expiration Date: 01/29/2016    Order Specific Question:  Has the patient fasted?    Answer:  Yes    Next appt:  6 mos labs before  Edinson Domeier L. Yasmin Bronaugh, D.O. Geriatrics [facility] [address], [place] [address] Cell Phone (Mon-Fri 8am-5pm):  [phone] On Call:  [phone] & follow prompts after 5pm & weekends Office Phone:  [phone] Office Fax:  [phone]

## 2015-02-11 ENCOUNTER — Ambulatory Visit
Admission: RE | Admit: 2015-02-11 | Discharge: 2015-02-11 | Disposition: A | Discharge status: Home / Self Care | Payer: Medicare Other | Source: Ambulatory Visit | Attending: Internal Medicine | Admitting: Internal Medicine

## 2015-02-11 DIAGNOSIS — M85852 Other specified disorders of bone density and structure, left thigh: Secondary | ICD-10-CM | POA: Diagnosis not present

## 2015-02-11 DIAGNOSIS — Z780 Asymptomatic menopausal state: Secondary | ICD-10-CM

## 2015-02-11 DIAGNOSIS — M8580 Other specified disorders of bone density and structure, unspecified site: Secondary | ICD-10-CM

## 2015-02-11 DIAGNOSIS — M8588 Other specified disorders of bone density and structure, other site: Secondary | ICD-10-CM | POA: Diagnosis not present

## 2015-02-12 ENCOUNTER — Other Ambulatory Visit: Payer: Self-pay | Admitting: Internal Medicine

## 2015-02-24 ENCOUNTER — Encounter: Payer: Self-pay | Admitting: Internal Medicine

## 2015-04-06 DIAGNOSIS — H2513 Age-related nuclear cataract, bilateral: Secondary | ICD-10-CM | POA: Diagnosis not present

## 2015-04-06 DIAGNOSIS — E119 Type 2 diabetes mellitus without complications: Secondary | ICD-10-CM | POA: Diagnosis not present

## 2015-04-06 LAB — HM DIABETES EYE EXAM

## 2015-04-09 ENCOUNTER — Encounter: Payer: Self-pay | Admitting: *Deleted

## 2015-05-16 ENCOUNTER — Other Ambulatory Visit: Payer: Self-pay | Admitting: Internal Medicine

## 2015-05-20 ENCOUNTER — Other Ambulatory Visit: Payer: Self-pay | Admitting: *Deleted

## 2015-05-20 MED ORDER — ONETOUCH ULTRASOFT LANCETS MISC: Status: DC

## 2015-05-20 NOTE — Telephone Encounter (Signed)
[facility] 

## 2015-05-26 ENCOUNTER — Other Ambulatory Visit: Payer: Self-pay | Admitting: Internal Medicine

## 2015-07-28 ENCOUNTER — Other Ambulatory Visit: Payer: Medicare Other

## 2015-07-28 DIAGNOSIS — E785 Hyperlipidemia, unspecified: Secondary | ICD-10-CM

## 2015-07-28 DIAGNOSIS — E1169 Type 2 diabetes mellitus with other specified complication: Secondary | ICD-10-CM

## 2015-07-28 DIAGNOSIS — I10 Essential (primary) hypertension: Secondary | ICD-10-CM | POA: Diagnosis not present

## 2015-07-28 DIAGNOSIS — E119 Type 2 diabetes mellitus without complications: Secondary | ICD-10-CM | POA: Diagnosis not present

## 2015-07-29 LAB — BASIC METABOLIC PANEL
BUN/Creatinine Ratio: 19 (ref 11–26)
BUN: 16 mg/dL (ref 8–27)
CO2: 24 mmol/L (ref 18–29)
Calcium: 9.8 mg/dL (ref 8.7–10.3)
Chloride: 100 mmol/L (ref 97–106)
Creatinine, Ser: 0.83 mg/dL (ref 0.57–1.00)
GFR calc Af Amer: 78 mL/min/{1.73_m2} (ref >59)
GFR calc non Af Amer: 68 mL/min/{1.73_m2} (ref >59)
Glucose: 119 mg/dL — ABNORMAL HIGH (ref 65–99)
Potassium: 4.3 mmol/L (ref 3.5–5.2)
Sodium: 141 mmol/L (ref 136–144)

## 2015-07-29 LAB — HEMOGLOBIN A1C
Est. average glucose Bld gHb Est-mCnc: 146 mg/dL
Hgb A1c MFr Bld: 6.7 % — ABNORMAL HIGH (ref 4.8–5.6)

## 2015-07-29 LAB — LIPID PANEL
Chol/HDL Ratio: 4.4 ratio units (ref 0.0–4.4)
Cholesterol, Total: 235 mg/dL — ABNORMAL HIGH (ref 100–199)
HDL: 53 mg/dL (ref >39)
LDL Calculated: 145 mg/dL — ABNORMAL HIGH (ref 0–99)
Triglycerides: 185 mg/dL — ABNORMAL HIGH (ref 0–149)
VLDL Cholesterol Cal: 37 mg/dL (ref 5–40)

## 2015-08-02 ENCOUNTER — Ambulatory Visit (INDEPENDENT_AMBULATORY_CARE_PROVIDER_SITE_OTHER): Payer: Medicare Other | Admitting: Internal Medicine

## 2015-08-02 ENCOUNTER — Encounter: Payer: Self-pay | Admitting: Internal Medicine

## 2015-08-02 VITALS — BP 122/78 | HR 71 | Temp 97.4°F | Resp 20 | Ht 65.0 in | Wt 151.2 lb

## 2015-08-02 DIAGNOSIS — I10 Essential (primary) hypertension: Secondary | ICD-10-CM | POA: Diagnosis not present

## 2015-08-02 DIAGNOSIS — E785 Hyperlipidemia, unspecified: Secondary | ICD-10-CM

## 2015-08-02 DIAGNOSIS — E1169 Type 2 diabetes mellitus with other specified complication: Secondary | ICD-10-CM

## 2015-08-02 DIAGNOSIS — E119 Type 2 diabetes mellitus without complications: Secondary | ICD-10-CM

## 2015-08-02 DIAGNOSIS — M8580 Other specified disorders of bone density and structure, unspecified site: Secondary | ICD-10-CM

## 2015-08-02 NOTE — Progress Notes (Signed)
Patient ID: Jocelyn Hendrix, female   DOB: 08-16-1935, 80 y.o.   MRN: [id number]   Location: [facility] Provider: Gwenith Spitziffany L. Renato Gailseed, D.O., C.M.D.  Code Status: full code due to personal experience, has MOST Goals of Care: Advanced Directive information Does patient have an advance directive?: Yes  Chief Complaint  Patient presents with  . Medical Management of Chronic Issues    6 month follow-up for Hypertension, DM, labs copied and in chart    HPI: Patient is a 80 y.o. female seen in the office today for med mgt of chronic diseases including diabetes, htn, diverticulosis with episode of diverticulitis.  Cholesterol has gone up (bad 111 to 145 now).  Has been more constipated.  Notes her eating has shifted off course.  She was helping a Network engineerneighbor.  Has to get back focused on looking after herself.  Also has not been walking as much either.  Started back on her walking better just before thanksgiving.  Reviewed that hba1c has gone up just slightly from 6 mos ago, but still better than 12 mos ago.  She is trying to find a dentist that will take her plan.  Has had to pay cash.  Fillings turning color up front.    She has had to be treated for bed bugs b/c a friend of hers she visiting in [place] had them.    BP:  Controlled with lisinopril.  DMII:  Is on asa, ace, and metformin  No further episodes of diverticulitis--peanuts caused her problems last time.  Drinking plenty of water.  Pt had Guillain Barre.  Reviewed latest study that these folks can still take the vaccines, but she says absolutely not.    I abstracted her cologuard in the proper location from 12/15.  Next due 12/18.  Review of Systems:  Review of Systems  Constitutional: Negative for fever and chills.  HENT: Negative for congestion and hearing loss.   Eyes: Negative for blurred vision.  Respiratory: Negative for shortness of breath.   Cardiovascular: Negative for chest pain and leg swelling.  Gastrointestinal:  Negative for abdominal pain, constipation, blood in stool and melena.       No further diverticulitis  Genitourinary: Negative for dysuria.  Musculoskeletal: Negative for falls.  Skin: Positive for itching and rash.       Resolved now, but has scarring  Neurological: Negative for dizziness and headaches.  Psychiatric/Behavioral: Negative for depression and memory loss.    Past Medical History  Diagnosis Date  . Hypertension   . Diabetes mellitus   . Hyperlipidemia   . Vitamin D deficiency   . 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 Myalgia and myositis, unspecified     Past Surgical History  Procedure Laterality Date  . 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  Always use your most recent med list.               aspirin 81 MG tablet  Take 81 mg by mouth daily.     BAYER CONTOUR TEST test strip  Generic drug:  glucose blood  TEST SUGAR 1 TO 2 TIMES A DAY(DX 250.00)     lisinopril 40 MG tablet  Commonly known as:  PRINIVIL,ZESTRIL  TAKE ONE TABLET BY MOUTH ONCE DAILY FOR BLOOD PRESSURE  metFORMIN 500 MG tablet  Commonly known as:  GLUCOPHAGE  Take one tablet by mouth twice daily to control blood sugar     onetouch ultrasoft lancets  Use as instructed to test blood sugar twice daily. DX: E11.9        Health Maintenance  Topic Date Due  . FOOT EXAM  08/08/2015  . HEMOGLOBIN A1C  01/25/2016  . OPHTHALMOLOGY EXAM  04/05/2016  . TETANUS/TDAP  11/12/2021  . DEXA SCAN  Completed    Physical Exam: Filed Vitals:   08/02/15 0942  BP: 122/78  Pulse: 71  Temp: 97.4 F (36.3 C)  TempSrc: Oral  Resp: 20  Height:  (1.651 m)  Weight: 151 lb 3.2 oz (68.584 kg)  SpO2: 98%   Body mass index is 25.16 kg/(m^2). Physical Exam  Constitutional: She is oriented to person, place, and time. She appears well-developed  and well-nourished. No distress.  Cardiovascular: Normal rate, regular rhythm, normal heart sounds and intact distal pulses.   Pulmonary/Chest: Effort normal and breath sounds normal. No respiratory distress.  Abdominal: Soft. Bowel sounds are normal. She exhibits no distension and no mass. There is no tenderness.  Musculoskeletal: Normal range of motion.  Neurological: She is alert and oriented to person, place, and time.  Skin: Skin is warm and dry.  A few scars on arms from bed bug exposure  Psychiatric: She has a normal mood and affect. Her behavior is normal. Judgment and thought content normal.    Labs reviewed: Basic Metabolic Panel:  Recent Labs  40/98/11 0919 01/27/15 0841 07/28/15 0850  NA 139 138 141  K 4.2 4.2 4.3  CL 99 100 100  CO2 GLUCOSE 126* 112* 119*  BUN CREATININE 0.82 0.81 0.83  CALCIUM 9.9 9.4 9.8   Liver Function Tests:  Recent Labs  08/05/14 0919 01/27/15 0841  AST 26 21  ALT 17 13  ALKPHOS 89 80  BILITOT 0.3 0.2  PROT 7.5 7.2  ALBUMIN 4.1 3.8   No results for input(s): LIPASE, AMYLASE in the last 8760 hours. No results for input(s): AMMONIA in the last 8760 hours. CBC:  Recent Labs  08/05/14 0919 01/27/15 0841  WBC 6.8 5.9  NEUTROABS 2.5 2.4  HGB 13.9  --   HCT 42.0 39.5  MCV 90  --   PLT 158  --    Lipid Panel:  Recent Labs  08/05/14 0919 01/27/15 0841 07/28/15 0850  CHOL 258* 196 235*  HDL 58 54 53  LDLCALC 167* 111* 145*  TRIG 163* 156* 185*  CHOLHDL 4.4 3.6 4.4   Lab Results  Component Value Date   HGBA1C 6.7* 07/28/2015    Assessment/Plan 1. Type 2 diabetes mellitus without complication, without long-term current use of insulin (HCC) -well controlled, but control less than last visit -she is working on getting back to her routine diet and exercise -foot exam at annual in May -on ace, asa, refuses statin - Hemoglobin A1c; Future  2. Hyperlipidemia associated with type 2 diabetes mellitus  (HCC) - lipids trended upward--refuses meds -will get back on diet and exercise - Lipid panel; Future  3. Essential hypertension, benign -bp well controlled, cont ace - CBC with Differential/Platelet; Future - Comprehensive metabolic panel; Future  4. Osteopenia -cont vitamin D supplement, weightbearing exercise -recheck BMD in 02/2017  Labs/tests ordered:   Orders Placed This Encounter  Procedures  . Hemoglobin A1c    Standing Status: Future  Number of Occurrences:      Standing Expiration Date: 08/01/2016  . Lipid panel    Standing Status: Future     Number of Occurrences:      Standing Expiration Date: 08/01/2016    Order Specific Question:  Has the patient fasted?    Answer:  Yes  . CBC with Differential/Platelet    Standing Status: Future     Number of Occurrences:      Standing Expiration Date: 08/01/2016  . Comprehensive metabolic panel    Standing Status: Future     Number of Occurrences:      Standing Expiration Date: 08/01/2016    Order Specific Question:  Has the patient fasted?    Answer:  Yes  . Cologuard    This external order was created through the Results Console.   Next appt:  6 mos for annual with labs before  Shaneil Yazdi L. Tiona Ruane, D.O. Geriatrics [facility] [address], [place] [address] Cell Phone (Mon-Fri 8am-5pm):  [phone] On Call:  [phone] & follow prompts after 5pm & weekends Office Phone:  [phone] Office Fax:  [phone]

## 2015-10-05 ENCOUNTER — Other Ambulatory Visit: Payer: Self-pay | Admitting: Internal Medicine

## 2015-11-23 ENCOUNTER — Encounter (HOSPITAL_COMMUNITY): Payer: Self-pay | Admitting: *Deleted

## 2015-11-23 ENCOUNTER — Emergency Department (HOSPITAL_COMMUNITY)
Admission: EM | Admit: 2015-11-23 | Discharge: 2015-11-23 | Disposition: A | Discharge status: Home / Self Care | Payer: Medicare Other | Attending: Emergency Medicine | Admitting: Emergency Medicine

## 2015-11-23 DIAGNOSIS — I10 Essential (primary) hypertension: Secondary | ICD-10-CM | POA: Diagnosis not present

## 2015-11-23 DIAGNOSIS — E119 Type 2 diabetes mellitus without complications: Secondary | ICD-10-CM | POA: Diagnosis not present

## 2015-11-23 DIAGNOSIS — T7849XA Other allergy, initial encounter: Secondary | ICD-10-CM | POA: Diagnosis not present

## 2015-11-23 DIAGNOSIS — Z7982 Long term (current) use of aspirin: Secondary | ICD-10-CM | POA: Diagnosis not present

## 2015-11-23 DIAGNOSIS — R2231 Localized swelling, mass and lump, right upper limb: Secondary | ICD-10-CM | POA: Insufficient documentation

## 2015-11-23 DIAGNOSIS — Z79899 Other long term (current) drug therapy: Secondary | ICD-10-CM | POA: Insufficient documentation

## 2015-11-23 DIAGNOSIS — M79641 Pain in right hand: Secondary | ICD-10-CM | POA: Insufficient documentation

## 2015-11-23 DIAGNOSIS — Z7984 Long term (current) use of oral hypoglycemic drugs: Secondary | ICD-10-CM | POA: Insufficient documentation

## 2015-11-23 DIAGNOSIS — Z91038 Other insect allergy status: Secondary | ICD-10-CM

## 2015-11-23 DIAGNOSIS — Z8669 Personal history of other diseases of the nervous system and sense organs: Secondary | ICD-10-CM | POA: Insufficient documentation

## 2015-11-23 DIAGNOSIS — S60561A Insect bite (nonvenomous) of right hand, initial encounter: Secondary | ICD-10-CM | POA: Diagnosis not present

## 2015-11-23 LAB — CBG MONITORING, ED: Glucose-Capillary: 83 mg/dL (ref 65–99)

## 2015-11-23 MED ORDER — TRIAMCINOLONE ACETONIDE 0.025 % EX OINT: 1.0000 "application " | TOPICAL_OINTMENT | Freq: Two times a day (BID) | CUTANEOUS | Status: DC

## 2015-11-23 MED ORDER — CEPHALEXIN 500 MG PO CAPS: 500.0000 mg | ORAL_CAPSULE | Freq: Four times a day (QID) | ORAL | Status: DC

## 2015-11-23 NOTE — Discharge Instructions (Signed)
Take the antibiotic only if you develop pain with worsening swelling, streaking, pus, or fever, chills and body aches. Call you primary care doctor immediately. You may take benadryl 12.5-25mg every 6 hours for itching.  Bee, Wasp, or [employer], wasps, and hornets are part of a family of insects that can sting people. These stings can cause pain and inflammation, but they are usually not serious. However, some people may have an allergic reaction to a sting. This can cause the symptoms to be more severe.  SYMPTOMS  Common symptoms of this condition include:   A red lump in the skin that sometimes has a tiny hole in the center. In some cases, a stinger may be in the center of the wound.  Pain and itching at the sting site.  Redness and swelling around the sting site. If you have an allergic reaction (localized allergic reaction), the swelling and redness may spread out from the sting site. In some cases, this reaction can continue to develop over the next 12-36 hours. In rare cases, a person may have a severe allergic reaction (anaphylactic reaction) to a sting. Symptoms of an anaphylactic reaction may include:   Wheezing or difficulty breathing.  Raised, itchy, red patches on the skin.  Nausea or vomiting.  Abdominal cramping.  Diarrhea.  Chest pain.  Fainting.  Redness of the face (flushing). DIAGNOSIS  This condition is usually diagnosed based on symptoms, medical history, and a physical exam. TREATMENT  Most stings can be treated with:   Icing to reduce swelling.  Medicines (antihistamines) to treat itching or an allergic reaction.  Medicines to help reduce pain. These may be medicines that you take by mouth, or medicated creams or lotions that you apply to your skin. If you were stung by a bee, the stinger and a small sac of poison may be in the wound. This may be removed by brushing across it with a flat card, such as a credit card. Another method is to pinch the  area and pull it out. These methods can help reduce the severity of the body's reaction to the sting.  HOME CARE INSTRUCTIONS   Wash the sting site daily with soap and water as told by your health care provider.  Apply or take over-the-counter and prescription medicines only as told by your health care provider.  If directed, apply ice to the sting area.  Put ice in a plastic bag.  Place a towel between your skin and the bag.  Leave the ice on for 20 minutes, 2-3 times per day.  Do not scratch the sting area.  To lessen pain, try using a paste that is made of water and baking soda. Rub the paste on the sting area and leave it on for 5 minutes.  If you had a severe allergic reaction to a sting, you may need:  To wear a medical bracelet or necklace that lists the allergy.  To learn when and how to use an anaphylaxis kit or epinephrine injection. Your family members may also need to learn this.  To carry an anaphylaxis kit with you at all times. SEEK MEDICAL CARE IF:   Your symptoms do not get better in 2-3 days.  You have redness, swelling, or pain that spreads beyond the area of the sting.  You have a fever. SEEK IMMEDIATE MEDICAL CARE IF:  You have symptoms of a severe allergic reaction. These include:   Wheezing or difficulty breathing.  Chest pain.  Light-headedness or  fainting.  Itchy, raised, red patches on the skin.  Nausea or vomiting.  Abdominal cramping.  Diarrhea.   This information is not intended to replace advice given to you by your health care provider. Make sure you discuss any questions you have with your health care provider.   Document Released: 08/21/2005 Document Revised: 05/12/2015 Document Reviewed: 01/06/2015 Elsevier Interactive Patient Education [employer].

## 2015-11-23 NOTE — ED Provider Notes (Signed)
CSN: [id number]     Arrival date & time 11/23/15  1117 History  By signing my name below, I, Jocelyn Hendrix, attest that this documentation has been prepared under the direction and in the presence of non-physician practitioner, Jocelyn CaptainAbigail Merlene Dante, PA-C. Electronically Signed: Freida Busmaniana Hendrix, Scribe. 11/23/2015. 1:47 PM.  Chief Complaint  Patient presents with  . Hand Pain   The history is provided by the patient. No language interpreter was used.    HPI Comments:  Jocelyn Hendrix is a 81 y.o. female who presents to the Emergency Department complaining of moderate swelling and mild pain to the right hand x 2 days. She reports associated redness to the site and small patches of redness to the right forearm.  Pt states she was bitten by an insect; believes it may have been a spider as she saw one in her room. She also notes a few neighbors have bed bugs but she has not seen any at home.  No alleviating factors noted.   Past Medical History  Diagnosis Date  . Hypertension   . Diabetes mellitus   . Hyperlipidemia   . Vitamin D deficiency   . 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 Myalgia and myositis, unspecified    Past Surgical History  Procedure Laterality Date  . 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 Heart failure Mother   . Diabetes Mother   . Alzheimer's disease Sister    Social History  Substance Use Topics  . Smoking status: Never Smoker   . Smokeless tobacco: None  . Alcohol Use: No   OB History    No data available     Review of Systems  Constitutional: Negative for fever and chills.  Musculoskeletal: Positive for myalgias (mild) and arthralgias.       Right hand   Skin: Positive for color change (redness).  Neurological: Negative for weakness and numbness.    Allergies  Review of patient's allergies indicates no known allergies.  Home Medications   Prior to  Admission medications   Medication Sig Start Date End Date Taking? Authorizing Provider  aspirin 81 MG tablet Take 81 mg by mouth daily.    Historical Provider, MD  BAYER CONTOUR TEST test strip TEST SUGAR 1 TO 2 TIMES A DAY(DX 250.00) 10/05/15   Jocelyn L Reed, DO  cephALEXin (KEFLEX) 500 MG capsule Take 1 capsule (500 mg total) by mouth 4 (four) times daily. 11/23/15   Jocelyn CaptainAbigail Nechama Escutia, PA-C  Lancets [facility](ONETOUCH ULTRASOFT) lancets Use as instructed to test blood sugar twice daily. DX: E11.9 05/20/15   Jocelyn L Reed, DO  lisinopril (PRINIVIL,ZESTRIL) 40 MG tablet TAKE ONE TABLET BY MOUTH ONCE DAILY FOR BLOOD PRESSURE 05/26/15   Jocelyn L Reed, DO  metFORMIN (GLUCOPHAGE) 500 MG tablet Take one tablet by mouth twice daily to control blood sugar 11/10/14   Jocelyn L Reed, DO  triamcinolone (KENALOG) 0.025 % ointment Apply 1 application topically 2 (two) times daily. Do not apply to face 11/23/15   Jocelyn CaptainAbigail Ladeana Laplant, PA-C   BP 188/73 mmHg  Pulse 62  Temp(Src) 98.4 F (36.9 C) (Oral)  Resp 16  Wt 152 lb (68.947 kg)  SpO2 99% Physical Exam  Constitutional: She is oriented to person, place, and time. She appears well-developed and well-nourished. No distress.  HENT:  Head: Normocephalic and atraumatic.  Eyes: Conjunctivae are normal.  Cardiovascular: Normal rate.  Pulmonary/Chest: Effort normal.  Abdominal: She exhibits no distension.  Neurological: She is alert and oriented to person, place, and time.  Skin: Skin is warm and dry.  Psychiatric: She has a normal mood and affect.  Nursing note and vitals reviewed.   ED Course  Procedures   DIAGNOSTIC STUDIES:  Oxygen Saturation is 99% on RA, normal by my interpretation.    COORDINATION OF CARE:  1:35 PM Discussed treatment plan with pt at bedside and pt agreed to plan.  Labs Review Labs Reviewed  CBG MONITORING, ED   I have personally reviewed and evaluated these images and lab results as part of my medical decision-making.   MDM    Patient with right hand swelling and redness. Pt is afebrile; no tachycardia, hypotension or other symptoms suggestive of severe infection. Area has been demarcated and pt advised to follow up for wound check in 2-3 days, sooner for worsening systemic symptoms, new lymphangitis, or significant spread of erythema past line of demarcation.  Return precautions discussed. Pt appears safe for discharge.    Final diagnoses:  Allergy to insect bites      I personally performed the services described in this documentation, which was scribed in my presence. The recorded information has been reviewed and is accurate.      Jocelyn Captain, PA-C 11/24/15 1703  Jocelyn Racer, MD 11/25/15 [phone]

## 2015-11-23 NOTE — ED Notes (Signed)
CBG 83 mg/dL reported to [place], [place]

## 2015-11-23 NOTE — ED Notes (Signed)
States when she woke uup yest am noticed her right hand was red and swollen states she was bitten by something. Unsure what.

## 2015-11-29 ENCOUNTER — Other Ambulatory Visit: Payer: Self-pay | Admitting: Internal Medicine

## 2016-01-06 ENCOUNTER — Other Ambulatory Visit (INDEPENDENT_AMBULATORY_CARE_PROVIDER_SITE_OTHER): Payer: Medicare Other

## 2016-01-06 DIAGNOSIS — E119 Type 2 diabetes mellitus without complications: Secondary | ICD-10-CM | POA: Diagnosis not present

## 2016-01-06 DIAGNOSIS — E785 Hyperlipidemia, unspecified: Secondary | ICD-10-CM

## 2016-01-06 DIAGNOSIS — I10 Essential (primary) hypertension: Secondary | ICD-10-CM | POA: Diagnosis not present

## 2016-01-06 DIAGNOSIS — E1169 Type 2 diabetes mellitus with other specified complication: Secondary | ICD-10-CM

## 2016-01-07 LAB — COMPREHENSIVE METABOLIC PANEL
ALT: 17 IU/L (ref 0–32)
AST: 24 IU/L (ref 0–40)
Albumin/Globulin Ratio: 1.2 (ref 1.2–2.2)
Albumin: 4.1 g/dL (ref 3.5–4.8)
Alkaline Phosphatase: 80 IU/L (ref 39–117)
BUN/Creatinine Ratio: 18 (ref 12–28)
BUN: 16 mg/dL (ref 8–27)
Bilirubin Total: 0.3 mg/dL (ref 0.0–1.2)
CO2: 26 mmol/L (ref 18–29)
Calcium: 9.9 mg/dL (ref 8.7–10.3)
Chloride: 101 mmol/L (ref 96–106)
Creatinine, Ser: 0.89 mg/dL (ref 0.57–1.00)
GFR calc Af Amer: 72 mL/min/{1.73_m2} (ref >59)
GFR calc non Af Amer: 62 mL/min/{1.73_m2} (ref >59)
Globulin, Total: 3.5 g/dL (ref 1.5–4.5)
Glucose: 123 mg/dL — ABNORMAL HIGH (ref 65–99)
Potassium: 5.1 mmol/L (ref 3.5–5.2)
Sodium: 140 mmol/L (ref 134–144)
Total Protein: 7.6 g/dL (ref 6.0–8.5)

## 2016-01-07 LAB — CBC WITH DIFFERENTIAL/PLATELET
Basophils Absolute: 0.1 10*3/uL (ref 0.0–0.2)
Basos: 1 %
EOS (ABSOLUTE): 0.2 10*3/uL (ref 0.0–0.4)
Eos: 4 %
Hematocrit: 42.2 % (ref 34.0–46.6)
Hemoglobin: 13.7 g/dL (ref 11.1–15.9)
Immature Grans (Abs): 0 10*3/uL (ref 0.0–0.1)
Immature Granulocytes: 0 %
Lymphocytes Absolute: 2.7 10*3/uL (ref 0.7–3.1)
Lymphs: 45 %
MCH: 28.8 pg (ref 26.6–33.0)
MCHC: 32.5 g/dL (ref 31.5–35.7)
MCV: 89 fL (ref 79–97)
Monocytes Absolute: 0.6 10*3/uL (ref 0.1–0.9)
Monocytes: 11 %
Neutrophils Absolute: 2.3 10*3/uL (ref 1.4–7.0)
Neutrophils: 39 %
Platelets: 167 10*3/uL (ref 150–379)
RBC: 4.76 x10E6/uL (ref 3.77–5.28)
RDW: 14.2 % (ref 12.3–15.4)
WBC: 6 10*3/uL (ref 3.4–10.8)

## 2016-01-07 LAB — LIPID PANEL
Chol/HDL Ratio: 3.8 ratio units (ref 0.0–4.4)
Cholesterol, Total: 222 mg/dL — ABNORMAL HIGH (ref 100–199)
HDL: 59 mg/dL (ref >39)
LDL Calculated: 139 mg/dL — ABNORMAL HIGH (ref 0–99)
Triglycerides: 121 mg/dL (ref 0–149)
VLDL Cholesterol Cal: 24 mg/dL (ref 5–40)

## 2016-01-07 LAB — HEMOGLOBIN A1C
Est. average glucose Bld gHb Est-mCnc: 154 mg/dL
Hgb A1c MFr Bld: 7 % — ABNORMAL HIGH (ref 4.8–5.6)

## 2016-01-10 ENCOUNTER — Encounter: Payer: Self-pay | Admitting: Internal Medicine

## 2016-01-10 ENCOUNTER — Ambulatory Visit (INDEPENDENT_AMBULATORY_CARE_PROVIDER_SITE_OTHER): Payer: Medicare Other | Admitting: Internal Medicine

## 2016-01-10 VITALS — BP 138/70 | HR 53 | Temp 98.2°F | Ht 65.0 in | Wt 150.0 lb

## 2016-01-10 DIAGNOSIS — E785 Hyperlipidemia, unspecified: Secondary | ICD-10-CM | POA: Diagnosis not present

## 2016-01-10 DIAGNOSIS — E119 Type 2 diabetes mellitus without complications: Secondary | ICD-10-CM

## 2016-01-10 DIAGNOSIS — I10 Essential (primary) hypertension: Secondary | ICD-10-CM

## 2016-01-10 DIAGNOSIS — E1169 Type 2 diabetes mellitus with other specified complication: Secondary | ICD-10-CM

## 2016-01-10 DIAGNOSIS — Z0000 Encounter for general adult medical examination without abnormal findings: Secondary | ICD-10-CM

## 2016-01-10 DIAGNOSIS — M8580 Other specified disorders of bone density and structure, unspecified site: Secondary | ICD-10-CM

## 2016-01-10 NOTE — Progress Notes (Signed)
Patient ID: Jocelyn Hendrix, female   DOB: [birth year], [age] y.o.   MRN: [id number] MMSE 30/30 passed clock drawing

## 2016-01-10 NOTE — Progress Notes (Signed)
Patient ID: Jocelyn Hendrix, female   DOB: 09-Dec-1934, 81 y.o.   MRN: [id number]   [facility]  [facility] Provider: Dawud Mays L. Renato Gailseed, D.O., C.M.D.  Patient Care Team: Kermit Baloiffany L La Shehan, DO as PCP - General (Geriatric Medicine) Ernesto Rutherfordobert Groat, MD as Consulting Physician (Ophthalmology)  Extended Emergency Contact Information Primary Emergency Contact: Rolene Arbourhrista,Edith  [place] Home Phone: [phone] Mobile Phone: [phone] Relation: Daughter  Code Status: DNR Goals of Care: Advanced Directive information Advanced Directives 01/10/2016  Does patient have an advance directive? Yes  Type of Advance Directive Out of facility DNR (pink MOST or yellow form)  Copy of advanced directive(s) in chart? Yes  Pre-existing out of facility DNR order (yellow form or pink MOST form) Pink MOST form placed in chart (order not valid for inpatient use)     Chief Complaint  Patient presents with  . Annual Exam    wellness exam  . MMSE    30/30 passed clock drawing    HPI: Patient is a 81 y.o. female seen in today for an annual wellness exam.    Got bit by something and used a cream on it.  Had swelling on her right hand from allergy to whatever the bug was.  She thinks it was a spider.  Swelling when down with the cream.  Never did find the bug.   Sugars doing ok.  Flares up to 145 if she eats something she shouldn't.  Depression screen [facility] 2/9 01/10/2016 01/29/2015 08/07/2014 02/19/2014 08/07/2013  Decreased Interest 0 0 0 0 0  Down, Depressed, Hopeless 0 0 0 0 0  PHQ - 2 Score 0 0 0 0 0    Fall Risk  01/10/2016 08/02/2015 01/29/2015 08/07/2014 02/19/2014  Falls in the past year? No No No No No   MMSE - Mini Mental State Exam 01/10/2016 08/07/2014  Orientation to time 5 5  Orientation to Place 5 5  Registration 3 3  Attention/ Calculation 5 5  Recall 3 2  Language- name 2 objects 2 2  Language- repeat 1 1  Language- follow 3 step command 3 3  Language- read & follow direction 1 1    Write a sentence 1 1  Copy design 1 1  Total score 30 29     Health Maintenance  Topic Date Due  . FOOT EXAM  08/08/2015  . OPHTHALMOLOGY EXAM  04/05/2016  . HEMOGLOBIN A1C  07/08/2016  . TETANUS/TDAP  11/12/2021  . DEXA SCAN  Completed    Urinary incontinence?  No difficulty. Functional Status Survey: Is the patient deaf or have difficulty hearing?: No Does the patient have difficulty seeing, even when wearing glasses/contacts?: No Does the patient have difficulty concentrating, remembering, or making decisions?: No Does the patient have difficulty walking or climbing stairs?: No Does the patient have difficulty dressing or bathing?: No Does the patient have difficulty doing errands alone such as visiting a doctor's office or shopping?: No Exercise?  Still walking, but less b/c she doesn't know her new neighbors.  She says they look too young.   Diet?  Tries to eat a low sodium diabetic diet Vision Screening Comments: appt with Dr. Dione BoozeGroat in July  Seeing ok.  Has cataracts but not ripe yet. Hearing:  No difficulty. Dentition:  Had a tooth extracted.  No one will take insurance.  [facility] to [facility] at [facility]--paid cash.  Has to go back to get front teeth fixed and will pay off eventually.   Pain:  No  pain.  Past Medical History  Diagnosis Date  . Hypertension   . Diabetes mellitus   . Hyperlipidemia   . Vitamin D deficiency   . 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 Myalgia and myositis, unspecified     Past Surgical History  Procedure Laterality Date  . 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 Marital Status: Widowed    Spouse Name: N/A  . Number of Children: N/A  . Years of Education: N/A   Occupational History  . Not on file.   Social History Main Topics  . Smoking status: Never Smoker   . Smokeless tobacco: Never Used  . Alcohol Use: No  . Drug Use: No  . Sexual  Activity: Not on file   Other Topics Concern  . Not on file   Social History Narrative    No Known Allergies    Medication List       This list is accurate as of: 01/10/16  2:27 PM.  Always use your most recent med list.               aspirin 81 MG tablet  Take 81 mg by mouth daily.     BAYER CONTOUR TEST test strip  Generic drug:  glucose blood  TEST SUGAR 1 TO 2 TIMES A DAY(DX 250.00)     lisinopril 40 MG tablet  Commonly known as:  PRINIVIL,ZESTRIL  TAKE ONE TABLET BY MOUTH ONCE DAILY FOR BLOOD PRESSURE     metFORMIN 500 MG tablet  Commonly known as:  GLUCOPHAGE  Take one tablet by mouth twice daily to control blood sugar     onetouch ultrasoft lancets  Use as instructed to test blood sugar twice daily. DX: E11.9     triamcinolone 0.025 % ointment  Commonly known as:  KENALOG  Apply 1 application topically 2 (two) times daily. Do not apply to face         Review of Systems:  Review of Systems  Constitutional: Negative for fever, chills and malaise/fatigue.  HENT: Negative for congestion.   Eyes: Negative for blurred vision.       Glasses, has cataracts  Respiratory: Negative for cough and shortness of breath.   Cardiovascular: Negative for chest pain and leg swelling.  Gastrointestinal: Negative for abdominal pain, constipation, blood in stool and melena.  Genitourinary: Negative for dysuria, urgency and frequency.  Musculoskeletal: Negative for myalgias, back pain, joint pain and falls.  Skin: Negative for rash.  Neurological: Negative for dizziness, loss of consciousness and weakness.  Psychiatric/Behavioral: Negative for depression and memory loss.    Physical Exam: Filed Vitals:   01/10/16 1355  BP: 138/70  Pulse: 53  Temp: 98.2 F (36.8 C)  TempSrc: Oral  Height: 5\' 5"  (1.651 m)  Weight: 150 lb (68.04 kg)  SpO2: 98%   Body mass index is 24.96 kg/(m^2). Physical Exam  Constitutional: She is oriented to person, place, and time. She  appears well-developed and well-nourished. No distress.  HENT:  Head: Normocephalic and atraumatic.  Right Ear: External ear normal.  Left Ear: External ear normal.  Nose: Nose normal.  Mouth/Throat: Oropharynx is clear and moist. No oropharyngeal exudate.  Eyes: Conjunctivae and EOM are normal. Pupils are equal, round, and reactive to light.  Neck: Normal range of motion. Neck supple. No JVD present.  Cardiovascular: Normal rate, regular rhythm, normal heart sounds and intact  distal pulses.   Pulmonary/Chest: Effort normal and breath sounds normal. No respiratory distress. She has no wheezes.  Abdominal: Soft. Bowel sounds are normal. She exhibits no distension and no mass. There is no tenderness.  Musculoskeletal: Normal range of motion.  Lymphadenopathy:    She has no cervical adenopathy.  Neurological: She is alert and oriented to person, place, and time. No cranial nerve deficit. She exhibits normal muscle tone.  Absent DTRs since she had Guillain Barre  Skin: Skin is warm and dry.  Psychiatric: She has a normal mood and affect.    Labs reviewed: Basic Metabolic Panel:  Recent Labs  16/10/96 0841 07/28/15 0850 01/06/16 0845  NA 138 141 140  K 4.2 4.3 5.1  CL 100 100 101  CO2 25 24 26   GLUCOSE 112* 119* 123*  BUN 18 16 16   CREATININE 0.81 0.83 0.89  CALCIUM 9.4 9.8 9.9   Liver Function Tests:  Recent Labs  01/27/15 0841 01/06/16 0845  AST 21 24  ALT 13 17  ALKPHOS 80 80  BILITOT 0.2 0.3  PROT 7.2 7.6  ALBUMIN 3.8 4.1   No results for input(s): LIPASE, AMYLASE in the last 8760 hours. No results for input(s): AMMONIA in the last 8760 hours. CBC:  Recent Labs  01/27/15 0841 01/06/16 0845  WBC 5.9 6.0  NEUTROABS 2.4 2.3  HCT 39.5 42.2  MCV 88 89  PLT  --  167   Lipid Panel:  Recent Labs  01/27/15 0841 07/28/15 0850 01/06/16 0845  CHOL 196 235* 222*  HDL 54 53 59  LDLCALC 111* 145* 139*  TRIG 156* 185* 121  CHOLHDL 3.6 4.4 3.8   Lab Results   Component Value Date   HGBA1C 7.0* 01/06/2016   EKG today normal sinus rhythm at   Assessment/Plan Medicare annual wellness visit, subsequent -is up to date Does not get vaccinations due to prior severe Guillain Barre   1. Essential hypertension, benign -bp at goal with current regimen--cont and monitor - EKG 12-Lead  2. Type 2 diabetes mellitus without complication, without long-term current use of insulin (HCC) - cont metformin, lisinopril, baby asa, refuses statin therapy, get back to walking for exercise as hba1c has trended upward  3. Hyperlipidemia associated with type 2 diabetes mellitus (HCC) -lipids not at goal, but she refuses medications -is working on diet, but fell off with walking due to loss of friend she walked with so needs new walking partner in her apt  4. Osteopenia -has been advised on multiple occasions to take vitamin D but it's again not on her meds  Labs/tests ordered:   Orders Placed This Encounter  Procedures  . Basic metabolic panel    Standing Status: Future     Number of Occurrences:      Standing Expiration Date: 09/11/2016    Order Specific Question:  Has the patient fasted?    Answer:  Yes  . Hemoglobin A1c    Standing Status: Future     Number of Occurrences:      Standing Expiration Date: 09/11/2016  . Lipid panel    Standing Status: Future     Number of Occurrences:      Standing Expiration Date: 09/11/2016    Order Specific Question:  Has the patient fasted?    Answer:  Yes  . TSH    Standing Status: Future     Number of Occurrences:      Standing Expiration Date: 09/11/2016  . EKG 12-Lead  Next appt:  4 mos med mgt   Spenser Cong L. Joellen Tullos, D.O. Geriatrics [facility] [address], [place] [address] Cell Phone (Mon-Fri 8am-5pm):  [phone] On Call:  [phone] & follow prompts after 5pm & weekends Office Phone:  [phone] Office Fax:  [phone]

## 2016-04-12 ENCOUNTER — Encounter: Payer: Self-pay | Admitting: *Deleted

## 2016-04-12 DIAGNOSIS — E119 Type 2 diabetes mellitus without complications: Secondary | ICD-10-CM | POA: Diagnosis not present

## 2016-04-12 DIAGNOSIS — H25813 Combined forms of age-related cataract, bilateral: Secondary | ICD-10-CM | POA: Diagnosis not present

## 2016-04-12 LAB — HM DIABETES EYE EXAM

## 2016-04-14 ENCOUNTER — Encounter: Payer: Self-pay | Admitting: Internal Medicine

## 2016-04-21 NOTE — Addendum Note (Signed)
Addended by: Lodema HongSIMPSON, MESHELL A on: 04/21/2016 02:24 PM   Modules accepted: Orders

## 2016-05-10 ENCOUNTER — Other Ambulatory Visit: Payer: Medicare Other

## 2016-05-12 ENCOUNTER — Ambulatory Visit: Payer: Medicare Other | Admitting: Internal Medicine

## 2016-05-26 ENCOUNTER — Other Ambulatory Visit: Payer: Self-pay | Admitting: Internal Medicine

## 2016-05-30 ENCOUNTER — Other Ambulatory Visit: Payer: Medicare Other

## 2016-05-30 DIAGNOSIS — E785 Hyperlipidemia, unspecified: Secondary | ICD-10-CM | POA: Diagnosis not present

## 2016-05-30 DIAGNOSIS — E1169 Type 2 diabetes mellitus with other specified complication: Secondary | ICD-10-CM

## 2016-05-30 DIAGNOSIS — E119 Type 2 diabetes mellitus without complications: Secondary | ICD-10-CM

## 2016-05-30 DIAGNOSIS — Z0000 Encounter for general adult medical examination without abnormal findings: Secondary | ICD-10-CM

## 2016-05-30 LAB — BASIC METABOLIC PANEL
BUN: 17 mg/dL (ref 7–25)
CO2: 27 mmol/L (ref 20–31)
Calcium: 9.9 mg/dL (ref 8.6–10.4)
Chloride: 103 mmol/L (ref 98–110)
Creat: 0.96 mg/dL — ABNORMAL HIGH (ref 0.60–0.93)
Glucose, Bld: 141 mg/dL — ABNORMAL HIGH (ref 65–99)
Potassium: 4.7 mmol/L (ref 3.5–5.3)
Sodium: 138 mmol/L (ref 135–146)

## 2016-05-30 LAB — LIPID PANEL
Cholesterol: 214 mg/dL — ABNORMAL HIGH (ref 125–200)
HDL: 56 mg/dL (ref >=46)
LDL Cholesterol: 124 mg/dL (ref <130)
Total CHOL/HDL Ratio: 3.8 Ratio (ref <=5.0)
Triglycerides: 170 mg/dL — ABNORMAL HIGH (ref <150)
VLDL: 34 mg/dL — ABNORMAL HIGH (ref <30)

## 2016-05-30 LAB — TSH: TSH: 2.88 mIU/L

## 2016-05-31 LAB — HEMOGLOBIN A1C
Hgb A1c MFr Bld: 6.8 % — ABNORMAL HIGH (ref <5.7)
Mean Plasma Glucose: 148 mg/dL

## 2016-06-01 ENCOUNTER — Ambulatory Visit (INDEPENDENT_AMBULATORY_CARE_PROVIDER_SITE_OTHER): Payer: Medicare Other | Admitting: Internal Medicine

## 2016-06-01 ENCOUNTER — Encounter: Payer: Self-pay | Admitting: Internal Medicine

## 2016-06-01 VITALS — BP 120/60 | HR 68 | Temp 97.9°F | Wt 146.0 lb

## 2016-06-01 DIAGNOSIS — Z8669 Personal history of other diseases of the nervous system and sense organs: Secondary | ICD-10-CM

## 2016-06-01 DIAGNOSIS — E119 Type 2 diabetes mellitus without complications: Secondary | ICD-10-CM

## 2016-06-01 DIAGNOSIS — E785 Hyperlipidemia, unspecified: Secondary | ICD-10-CM

## 2016-06-01 DIAGNOSIS — I10 Essential (primary) hypertension: Secondary | ICD-10-CM | POA: Diagnosis not present

## 2016-06-01 DIAGNOSIS — E1169 Type 2 diabetes mellitus with other specified complication: Secondary | ICD-10-CM | POA: Diagnosis not present

## 2016-06-01 NOTE — Progress Notes (Signed)
[facility] Provider:  Hashim Eichhorst L. Renato Gailseed, D.O., C.M.D.  Code Status: DNR Goals of Care:  Advanced Directives 06/01/2016  Does patient have an advance directive? Yes  Type of Advance Directive Out of facility DNR (pink MOST or yellow form)  Does patient want to make changes to advanced directive? -  Copy of advanced directive(s) in chart? Yes  Would patient like information on creating an advanced directive? -  Pre-existing out of facility DNR order (yellow form or pink MOST form) Pink MOST form placed in chart (order not valid for inpatient use)     Chief Complaint  Patient presents with  . Medical Management of Chronic Issues    4 mth follow-up    HPI: Patient is a 81 y.o. female seen today for medical management of chronic diseases.    Said she was sick about 2 wks ago--had a lot of garlic and onions until the infection passed rather than taking cold medicine.  She also had a whole lemon and 1/2 and honey.  Had coffee with milk and says she had a little phlegm then.  No fever or muscle aches.  Had throat tickle and head congestion.    DMII:  Sugar average improved.   Doing some walking but not as much.  Hyperlipidemia:  LDL better, but TG worse HTN:  BP great today.   TSH was normal. Down 4 lbs.   She is going to get her new glasses next month.  Price keeps going up.    Was drinking tea and not water so renal function bumped slightly.  Glucose slightly up but was ill.    Past Medical History:  Diagnosis Date  . Diabetes mellitus   . Hyperlipidemia   . Hypertension   . Myalgia and myositis, unspecified   . 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 Vitamin D deficiency     Past Surgical History:  Procedure Laterality Date  . 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 Always use your most recent med list.          aspirin 81 MG  tablet Take 81 mg by mouth daily.   BAYER CONTOUR TEST test strip Generic drug:  glucose blood TEST SUGAR 1 TO 2 TIMES A DAY(DX 250.00)   lisinopril 40 MG tablet Commonly known as:  PRINIVIL,ZESTRIL TAKE ONE TABLET BY MOUTH ONCE DAILY FOR BLOOD PRESSURE   metFORMIN 500 MG tablet Commonly known as:  GLUCOPHAGE Take one tablet by mouth twice daily to control blood sugar   onetouch ultrasoft lancets Use as instructed to test blood sugar twice daily. DX: E11.9       Review of Systems:  Review of Systems  Constitutional: Positive for weight loss. Negative for chills, fever and malaise/fatigue.       Down 4 lbs  HENT: Negative for hearing loss.   Eyes: Negative for blurred vision.       Needs to pick up her new prescription  Respiratory: Positive for cough. Negative for hemoptysis, sputum production, shortness of breath and wheezing.   Cardiovascular: Negative for chest pain, palpitations and leg swelling.  Gastrointestinal: Negative for abdominal pain, blood in stool, constipation, diarrhea and melena.  Genitourinary: Negative for dysuria, frequency and urgency.  Musculoskeletal: Negative for falls, joint pain and myalgias.  Skin: Negative for itching  and rash.  Neurological: Negative for dizziness, tingling, sensory change, loss of consciousness and weakness.  Endo/Heme/Allergies: Does not bruise/bleed easily.  Psychiatric/Behavioral: Negative for depression and memory loss. The patient is not nervous/anxious and does not have insomnia.     Health Maintenance  Topic Date Due  . HEMOGLOBIN A1C  11/27/2016  . FOOT EXAM  01/09/2017  . OPHTHALMOLOGY EXAM  04/12/2017  . TETANUS/TDAP  11/12/2021  . DEXA SCAN  Completed    Physical Exam: Vitals:   06/01/16 0756  BP: 120/60  Pulse: 68  Temp: 97.9 F (36.6 C)  TempSrc: Oral  SpO2: 98%  Weight: 146 lb (66.2 kg)   Body mass index is 24.3 kg/m. Physical Exam  Constitutional: She is oriented to person, place, and time.  She appears well-developed and well-nourished. No distress.  HENT:  Head: Normocephalic and atraumatic.  Cardiovascular: Normal rate, regular rhythm, normal heart sounds and intact distal pulses.   Pulmonary/Chest: Effort normal and breath sounds normal. No respiratory distress.  Abdominal: Bowel sounds are normal.  Musculoskeletal: Normal range of motion.  Neurological: She is alert and oriented to person, place, and time.  Skin: Skin is warm and dry.  Psychiatric: She has a normal mood and affect.    Labs reviewed: Basic Metabolic Panel:  Recent Labs  16/10/96 0850 01/06/16 0845 05/30/16 0800  NA 141 140 138  K 4.3 5.1 4.7  CL 100 101 103  CO2 24 26 27   GLUCOSE 119* 123* 141*  BUN 16 16 17   CREATININE 0.83 0.89 0.96*  CALCIUM 9.8 9.9 9.9  TSH  --   --  2.88   Liver Function Tests:  Recent Labs  01/06/16 0845  AST 24  ALT 17  ALKPHOS 80  BILITOT 0.3  PROT 7.6  ALBUMIN 4.1   No results for input(s): LIPASE, AMYLASE in the last 8760 hours. No results for input(s): AMMONIA in the last 8760 hours. CBC:  Recent Labs  01/06/16 0845  WBC 6.0  NEUTROABS 2.3  HCT 42.2  MCV 89  PLT 167   Lipid Panel:  Recent Labs  07/28/15 0850 01/06/16 0845 05/30/16 0800  CHOL 235* 222* 214*  HDL 53 59 56  LDLCALC 145* 139* 124  TRIG 185* 121 170*  CHOLHDL 4.4 3.8 3.8   Lab Results  Component Value Date   HGBA1C 6.8 (H) 05/30/2016    Assessment/Plan 1. Essential hypertension, benign -bp at goal with lisinopril--cont same  2. Type 2 diabetes mellitus without complication, without long-term current use of insulin (HCC) -cont baby asa, metformin, ace, control is good  3. Hyperlipidemia associated with type 2 diabetes mellitus (HCC) -improved LDL from last time -will check direct LDL next visit -she plans to walk more and cont to work on her diet  4. History of Guillain-Barre syndrome -is not willing to take any vaccinations due to this so this was noted in  her chart today by CMA  Labs/tests ordered:   Orders Placed This Encounter  Procedures  . Hemoglobin A1c    Standing Status:   Future    Standing Expiration Date:   01/29/2017  . LDL Cholesterol, Direct    Standing Status:   Future    Standing Expiration Date:   01/29/2017  . Basic metabolic panel    Standing Status:   Future    Standing Expiration Date:   01/29/2017   Next appt: 09/28/2016 for med mgt, labs before  Aleysha Meckler L. Bayron Dalto, D.O. Geriatrics [facility] [address], [place] [address] Cell Phone (Mon-Fri 8am-5pm):  [phone] On Call:  [phone] & follow prompts after 5pm & weekends Office Phone:  [phone] Office Fax:  [phone]

## 2016-06-10 ENCOUNTER — Other Ambulatory Visit: Payer: Self-pay | Admitting: Internal Medicine

## 2016-08-16 ENCOUNTER — Telehealth: Payer: Self-pay | Admitting: Internal Medicine

## 2016-08-16 NOTE — Telephone Encounter (Signed)
left msg asking pt to confirm this AWV appt w/ nurse and EV w/ Dr. Renato Gailseed. VDM (DD)

## 2016-09-25 ENCOUNTER — Other Ambulatory Visit: Payer: Self-pay

## 2016-09-25 DIAGNOSIS — I10 Essential (primary) hypertension: Secondary | ICD-10-CM

## 2016-09-25 DIAGNOSIS — E119 Type 2 diabetes mellitus without complications: Secondary | ICD-10-CM

## 2016-09-26 ENCOUNTER — Other Ambulatory Visit: Payer: Medicare Other

## 2016-09-26 DIAGNOSIS — I10 Essential (primary) hypertension: Secondary | ICD-10-CM | POA: Diagnosis not present

## 2016-09-26 DIAGNOSIS — E119 Type 2 diabetes mellitus without complications: Secondary | ICD-10-CM

## 2016-09-26 DIAGNOSIS — E1169 Type 2 diabetes mellitus with other specified complication: Secondary | ICD-10-CM

## 2016-09-26 DIAGNOSIS — E785 Hyperlipidemia, unspecified: Secondary | ICD-10-CM | POA: Diagnosis not present

## 2016-09-26 LAB — BASIC METABOLIC PANEL WITH GFR
BUN: 11 mg/dL (ref 7–25)
CO2: 29 mmol/L (ref 20–31)
Calcium: 9.4 mg/dL (ref 8.6–10.4)
Chloride: 103 mmol/L (ref 98–110)
Creat: 0.85 mg/dL (ref 0.60–0.93)
GFR, Est African American: 75 mL/min (ref >=60)
GFR, Est Non African American: 65 mL/min (ref >=60)
Glucose, Bld: 151 mg/dL — ABNORMAL HIGH (ref 65–99)
Potassium: 3.9 mmol/L (ref 3.5–5.3)
Sodium: 140 mmol/L (ref 135–146)

## 2016-09-26 LAB — LDL CHOLESTEROL, DIRECT: Direct LDL: 75 mg/dL (ref <130)

## 2016-09-27 LAB — HEMOGLOBIN A1C
Hgb A1c MFr Bld: 6.6 % — ABNORMAL HIGH (ref <5.7)
Mean Plasma Glucose: 143 mg/dL

## 2016-09-28 ENCOUNTER — Ambulatory Visit: Payer: Medicare Other | Admitting: Internal Medicine

## 2016-09-29 ENCOUNTER — Encounter: Payer: Self-pay | Admitting: Internal Medicine

## 2016-09-29 ENCOUNTER — Ambulatory Visit (INDEPENDENT_AMBULATORY_CARE_PROVIDER_SITE_OTHER): Payer: Medicare Other | Admitting: Internal Medicine

## 2016-09-29 VITALS — BP 140/80 | HR 68 | Temp 98.1°F | Wt 147.0 lb

## 2016-09-29 DIAGNOSIS — E1169 Type 2 diabetes mellitus with other specified complication: Secondary | ICD-10-CM | POA: Diagnosis not present

## 2016-09-29 DIAGNOSIS — Z8669 Personal history of other diseases of the nervous system and sense organs: Secondary | ICD-10-CM

## 2016-09-29 DIAGNOSIS — E119 Type 2 diabetes mellitus without complications: Secondary | ICD-10-CM | POA: Diagnosis not present

## 2016-09-29 DIAGNOSIS — E785 Hyperlipidemia, unspecified: Secondary | ICD-10-CM | POA: Diagnosis not present

## 2016-09-29 DIAGNOSIS — I10 Essential (primary) hypertension: Secondary | ICD-10-CM | POA: Diagnosis not present

## 2016-09-29 NOTE — Progress Notes (Signed)
Location:  [facility] clinic Provider:  Amman Bartel L. Renato Gails, D.O., C.M.D.  Goals of Care:  Advanced Directives 09/29/2016  Does Patient Have a Medical Advance Directive? Yes  Type of Advance Directive Out of facility DNR (pink MOST or yellow form)  Does patient want to make changes to medical advance directive? -  Copy of Healthcare Power of Attorney in Chart? -  Would patient like information on creating a medical advance directive? -  Pre-existing out of facility DNR order (yellow form or pink MOST form) Pink MOST form placed in chart (order not valid for inpatient use)     Chief Complaint  Patient presents with  . Medical Management of Chronic Issues    follow-up    HPI: Patient is a 82 y.o. female seen today for medical management of chronic diseases.    She has lost some weight by modifying her diet.  She reports eating some soups and crackers and things she does not eat in the summer--comfort foods.   hba1c better at 6.6 from 6.8.  Renal function back to normal and hydrating well LDL was 75 which was quite good. No pains.  No diverticulitis flares.  Not walking much with the cold.  Even cold in the hallway.    Past Medical History:  Diagnosis Date  . Diabetes mellitus   . Hyperlipidemia   . Hypertension   . Myalgia and myositis, unspecified   . 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 Vitamin D deficiency     Past Surgical History:  Procedure Laterality Date  . 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 Always use your most recent med list.          aspirin 81 MG tablet Take 81 mg by mouth daily.   BAYER CONTOUR TEST test strip Generic drug:  glucose blood TEST SUGAR 1 TO 2 TIMES A DAY(DX 250.00)   lisinopril 40 MG tablet Commonly known as:  PRINIVIL,ZESTRIL TAKE ONE TABLET BY  MOUTH ONCE DAILY FOR BLOOD PRESSURE   metFORMIN 500 MG tablet Commonly known as:  GLUCOPHAGE TAKE ONE TABLET BY MOUTH TWICE DAILY TO CONTROL BLOOD SUGAR   onetouch ultrasoft lancets USE AS INSTRUCTED TO TEST BLOOD SUGAR TWICE DAILY. DX: E11.9       Review of Systems:  Review of Systems  Constitutional: Negative for chills and fever.  HENT: Negative for hearing loss.   Eyes:       Glasses  Respiratory: Negative for shortness of breath.   Cardiovascular: Negative for chest pain and palpitations.  Gastrointestinal: Negative for abdominal pain, blood in stool, constipation, diarrhea and melena.  Genitourinary: Negative for dysuria.  Musculoskeletal: Negative for falls, joint pain and myalgias.  Skin: Negative for itching and rash.  Neurological: Negative for dizziness and loss of consciousness.  Endo/Heme/Allergies: Does not bruise/bleed easily.  Psychiatric/Behavioral: Negative for depression, hallucinations and memory loss. The patient is not nervous/anxious.     Health Maintenance  Topic Date Due  . FOOT EXAM  01/09/2017  . HEMOGLOBIN A1C  03/26/2017  . OPHTHALMOLOGY EXAM  04/12/2017  . TETANUS/TDAP  11/12/2021  . DEXA SCAN  Completed    Physical Exam: Vitals:   09/29/16 1007  BP: 140/80  Pulse: 68  Temp: 98.1 F (36.7 C)  TempSrc: Oral  SpO2: 97%  Weight: 147 lb (66.7 kg)   Body mass index is 24.46 kg/m. Physical Exam  Constitutional: She is oriented to person, place, and time. She appears well-developed and well-nourished. No distress.  Eyes:  glasses  Cardiovascular: Normal rate, regular rhythm, normal heart sounds and intact distal pulses.   Pulmonary/Chest: Effort normal and breath sounds normal. No respiratory distress.  Abdominal: Soft. Bowel sounds are normal.  Musculoskeletal: Normal range of motion.  Neurological: She is alert and oriented to person, place, and time.  Skin: Skin is warm and dry.  Psychiatric: She has a normal mood and affect.     Labs reviewed: Basic Metabolic Panel:  Recent Labs  82/95/6203/12/20 0845 05/30/16 0800 09/26/16 0927  NA 140 138 140  K 5.1 4.7 3.9  CL 101 103 103  CO2 26 27 29   GLUCOSE 123* 141* 151*  BUN 16 17 11   CREATININE 0.89 0.96* 0.85  CALCIUM 9.9 9.9 9.4  TSH  --  2.88  --    Liver Function Tests:  Recent Labs  01/06/16 0845  AST 24  ALT 17  ALKPHOS 80  BILITOT 0.3  PROT 7.6  ALBUMIN 4.1   No results for input(s): LIPASE, AMYLASE in the last 8760 hours. No results for input(s): AMMONIA in the last 8760 hours. CBC:  Recent Labs  01/06/16 0845  WBC 6.0  NEUTROABS 2.3  HCT 42.2  MCV 89  PLT 167   Lipid Panel:  Recent Labs  01/06/16 0845 05/30/16 0800 09/26/16 0927  CHOL 222* 214*  --   HDL 59 56  --   LDLCALC 139* 124  --   TRIG 121 170*  --   CHOLHDL 3.8 3.8  --   LDLDIRECT  --   --  75   Lab Results  Component Value Date   HGBA1C 6.6 (H) 09/26/2016    Assessment/Plan 1. Essential hypertension, benign -bp at goal with current therapy, cont same - COMPLETE METABOLIC PANEL WITH GFR; Future  2. Type 2 diabetes mellitus without complication, without long-term current use of insulin (HCC) -cont current regimen, get back to walking and off sweets and carbs - COMPLETE METABOLIC PANEL WITH GFR; Future - Hemoglobin A1c; Future  3. Hyperlipidemia associated with type 2 diabetes mellitus (HCC) -lipids satisfactory, cont same regimen  4. History of Guillain-Barre syndrome -does not take vaccines as a result--notated several ways, but still shows up on all the kpn paperwork  Labs/tests ordered:   Orders Placed This Encounter  Procedures  . COMPLETE METABOLIC PANEL WITH GFR    [facility]    Standing Status:   Future    Standing Expiration Date:   05/30/2017  . Hemoglobin A1c    Standing Status:   Future    Standing Expiration Date:   05/30/2017   Next appt:  01/18/2017   Emiyah Spraggins L. Emalyn Schou, D.O. Geriatrics [facility] [address] [place], [place] [address] Cell Phone (Mon-Fri 8am-5pm):  [phone] On Call:  [phone] & follow prompts after 5pm & weekends Office Phone:  [phone] Office Fax:  [phone]

## 2016-11-10 ENCOUNTER — Encounter: Payer: Self-pay | Admitting: Internal Medicine

## 2016-11-24 ENCOUNTER — Other Ambulatory Visit: Payer: Self-pay | Admitting: Internal Medicine

## 2016-12-11 ENCOUNTER — Other Ambulatory Visit: Payer: Self-pay | Admitting: Internal Medicine

## 2017-01-17 ENCOUNTER — Ambulatory Visit (INDEPENDENT_AMBULATORY_CARE_PROVIDER_SITE_OTHER): Payer: Medicare Other

## 2017-01-17 VITALS — BP 128/74 | Temp 98.2°F | Ht 65.0 in | Wt 151.0 lb

## 2017-01-17 DIAGNOSIS — Z0000 Encounter for general adult medical examination without abnormal findings: Secondary | ICD-10-CM

## 2017-01-17 DIAGNOSIS — Z9189 Other specified personal risk factors, not elsewhere classified: Secondary | ICD-10-CM

## 2017-01-17 NOTE — Progress Notes (Signed)
Subjective:   Jocelyn Hendrix is a 82 y.o. female who presents for Medicare Annual (Subsequent) preventive examination.     Objective:     Vitals: BP 128/74 (BP Location: Left Arm, Patient Position: Sitting)   Temp 98.2 F (36.8 C) (Oral)   Ht 5' 5" (1.651 m)   Wt 151 lb (68.5 kg)   SpO2 97%   BMI 25.13 kg/m   Body mass index is 25.13 kg/m.   Tobacco History  Smoking Status  . Never Smoker  Smokeless Tobacco  . Never Used     Counseling given: Not Answered   Past Medical History:  Diagnosis Date  . Diabetes mellitus   . Hyperlipidemia   . Hypertension   . Myalgia and myositis, unspecified   . 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 Vitamin D deficiency    Past Surgical History:  Procedure Laterality Date  . 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 Heart failure Mother   . Diabetes Mother   . Alzheimer's disease Sister    History  Sexual Activity  . Sexual activity: Not on file    Outpatient Encounter Prescriptions as of 01/17/2017  Medication Sig  . aspirin 81 MG tablet Take 81 mg by mouth daily.  Marland Kitchen BAYER CONTOUR TEST test strip TEST SUGAR 1 TO 2 TIMES A DAY(DX 250.00)  . Lancets (ONETOUCH ULTRASOFT) lancets USE AS INSTRUCTED TO TEST BLOOD SUGAR TWICE DAILY. DX: E11.9  . lisinopril (PRINIVIL,ZESTRIL) 40 MG tablet TAKE ONE TABLET BY MOUTH ONCE DAILY FOR BLOOD PRESSURE  . metFORMIN (GLUCOPHAGE) 500 MG tablet TAKE ONE TABLET BY MOUTH TWICE DAILY TO CONTROL BLOOD SUGAR   No facility-administered encounter medications on file as of 01/17/2017.     Activities of Daily Living In your present state of health, do you have any difficulty performing the following activities: 01/17/2017  Hearing? N  Vision? N  Difficulty concentrating or making decisions? N  Walking or climbing stairs? N  Dressing or bathing? N  Doing errands, shopping? Y  Preparing Food and  eating ? N  Using the Toilet? N  In the past six months, have you accidently leaked urine? N  Do you have problems with loss of bowel control? N  Managing your Medications? N  Managing your Finances? N  Housekeeping or managing your Housekeeping? N  Some recent data might be hidden    Patient Care Team: Gayland Curry, DO as PCP - General (Geriatric Medicine) Clent Jacks, MD as Consulting Physician (Ophthalmology)    Assessment:     Exercise Activities and Dietary recommendations Current Exercise Habits: Home exercise routine, Type of exercise: Other - see comments (tai chi and dancing), Time (Minutes): 30, Frequency (Times/Week): 2, Weekly Exercise (Minutes/Week): 60, Intensity: Mild, Exercise limited by: None identified  Goals    . Maintain Lifestyle          Starting today pt will maintain lifestyle.       Fall Risk Fall Risk  01/17/2017 09/29/2016 06/01/2016 01/10/2016 08/02/2015  Falls in the past year? _0    Depression Screen PHQ 2/9 Scores 01/17/2017 09/29/2016 06/01/2016 01/10/2016  PHQ - 2 Score 0 0 0 0     Cognitive Function MMSE - Mini Mental State Exam 01/17/2017 01/10/2016 08/07/2014  Orientation to time _1 Orientation to Place _2 Registration 3 3 3  Attention/ Calculation 4 5 5  Recall 0 3 2  Language- name 2 objects 2 2 2  Language- repeat 1 1 1  Language- follow 3 step command 3 3 3  Language- read & follow direction 1 1 1  Write a sentence 1 1 1  Copy design 0 1 1  Total score 24 30 29        Immunization History  Administered Date(s) Administered  . Tdap 11/13/2011   Screening Tests Health Maintenance  Topic Date Due  . FOOT EXAM  01/09/2017  . HEMOGLOBIN A1C  03/26/2017  . OPHTHALMOLOGY EXAM  04/12/2017  . TETANUS/TDAP  11/12/2021  . DEXA SCAN  Completed      Plan:    I have personally reviewed and addressed the Medicare Annual Wellness questionnaire and have noted the following in the patient's chart:  A. Medical and  social history B. Use of alcohol, tobacco or illicit drugs  C. Current medications and supplements D. Functional ability and status E.  Nutritional status F.  Physical activity G. Advance directives H. List of other physicians I.  Hospitalizations, surgeries, and ER visits in previous 12 months J.  Vitals K. Screenings to include hearing, vision, cognitive, depression L. Referrals and appointments - none  In addition, I have reviewed and discussed with patient certain preventive protocols, quality metrics, and best practice recommendations. A written personalized care plan for preventive services as well as general preventive health recommendations were provided to patient.  See attached scanned questionnaire for additional information.   Signed,   Sara Gonthier, RN Nurse Health Advisor    I have reviewed the health advisor's note and was available for consultation. I agree with documentation and plan.   Monica S. Carter, D. O., F. A. C. O. I.  [facility] [address] [place], [place] [address] [phone] Cell (Monday-Friday 8 AM - 5 PM) [phone] After 5 PM and follow prompts    

## 2017-01-17 NOTE — Progress Notes (Signed)
Quick Notes   Health Maintenance: Foot exam due.      Abnormal Screen: MMSE 24/30. Passed clock drawing     Patient Concerns: Looking for dentist that takes medicaid[facility]     Nurse Concerns: None

## 2017-01-17 NOTE — Patient Instructions (Addendum)
[name] , Thank you for taking time to come for your Medicare Wellness Visit. I appreciate your ongoing commitment to your health goals. Please review the following plan we discussed and let me know if I can assist you in the future.   Screening recommendations/referrals: Colonoscopy up to date Mammogram up to date Bone Density up to date Recommended yearly ophthalmology/optometry visit for glaucoma screening and checkup Recommended yearly dental visit for hygiene and checkup  Vaccinations: Influenza vaccine contraindicated Pneumococcal vaccine contraindicated Tdap vaccine up to date. Due 11/12/2021 Shingles vaccine contradindicated  Advanced directives: In Chart  Conditions/risks identified: None  Next appointment: Dr. Renato Gailseed 5/17 @ 1:45pm   Preventive Care 65 Years and Older, Female Preventive care refers to lifestyle choices and visits with your health care provider that can promote health and wellness. What does preventive care include?  A yearly physical exam. This is also called an annual well check.  Dental exams once or twice a year.  Routine eye exams. Ask your health care provider how often you should have your eyes checked.  Personal lifestyle choices, including:  Daily care of your teeth and gums.  Regular physical activity.  Eating a healthy diet.  Avoiding tobacco and drug use.  Limiting alcohol use.  Practicing safe sex.  Taking low-dose aspirin every day.  Taking vitamin and mineral supplements as recommended by your health care provider. What happens during an annual well check? The services and screenings done by your health care provider during your annual well check will depend on your age, overall health, lifestyle risk factors, and family history of disease. Counseling  Your health care provider may ask you questions about your:  Alcohol use.  Tobacco use.  Drug use.  Emotional well-being.  Home and relationship well-being.  Sexual  activity.  Eating habits.  History of falls.  Memory and ability to understand (cognition).  Work and work Astronomerenvironment.  Reproductive health. Screening  You may have the following tests or measurements:  Height, weight, and BMI.  Blood pressure.  Lipid and cholesterol levels. These may be checked every 5 years, or more frequently if you are over 80 years old.  Skin check.  Lung cancer screening. You may have this screening every year starting at age 80 if you have a 30-pack-year history of smoking and currently smoke or have quit within the past 15 years.  Fecal occult blood test (FOBT) of the stool. You may have this test every year starting at age 80.  Flexible sigmoidoscopy or colonoscopy. You may have a sigmoidoscopy every 5 years or a colonoscopy every 10 years starting at age 250.  Hepatitis C blood test.  Hepatitis B blood test.  Sexually transmitted disease (STD) testing.  Diabetes screening. This is done by checking your blood sugar (glucose) after you have not eaten for a while (fasting). You may have this done every 1-3 years.  Bone density scan. This is done to screen for osteoporosis. You may have this done starting at age 80.  Mammogram. This may be done every 1-2 years. Talk to your health care provider about how often you should have regular mammograms. Talk with your health care provider about your test results, treatment options, and if necessary, the need for more tests. Vaccines  Your health care provider may recommend certain vaccines, such as:  Influenza vaccine. This is recommended every year.  Tetanus, diphtheria, and acellular pertussis (Tdap, Td) vaccine. You may need a Td booster every 10 years.  Zoster vaccine. You  may need this after age 85.  Pneumococcal 13-valent conjugate (PCV13) vaccine. One dose is recommended after age 46.  Pneumococcal polysaccharide (PPSV23) vaccine. One dose is recommended after age 94. Talk to your health care  provider about which screenings and vaccines you need and how often you need them. This information is not intended to replace advice given to you by your health care provider. Make sure you discuss any questions you have with your health care provider. Document Released: 09/17/2015 Document Revised: 05/10/2016 Document Reviewed: 06/22/2015 Elsevier Interactive Patient Education  2017 [employer] Prevention in the Home Falls can cause injuries. They can happen to people of all ages. There are many things you can do to make your home safe and to help prevent falls. What can I do on the outside of my home?  Regularly fix the edges of walkways and driveways and fix any cracks.  Remove anything that might make you trip as you walk through a door, such as a raised step or threshold.  Trim any bushes or trees on the path to your home.  Use bright outdoor lighting.  Clear any walking paths of anything that might make someone trip, such as rocks or tools.  Regularly check to see if handrails are loose or broken. Make sure that both sides of any steps have handrails.  Any raised decks and porches should have guardrails on the edges.  Have any leaves, snow, or ice cleared regularly.  Use sand or salt on walking paths during winter.  Clean up any spills in your garage right away. This includes oil or grease spills. What can I do in the bathroom?  Use night lights.  Install grab bars by the toilet and in the tub and shower. Do not use towel bars as grab bars.  Use non-skid mats or decals in the tub or shower.  If you need to sit down in the shower, use a plastic, non-slip stool.  Keep the floor dry. Clean up any water that spills on the floor as soon as it happens.  Remove soap buildup in the tub or shower regularly.  Attach bath mats securely with double-sided non-slip rug tape.  Do not have throw rugs and other things on the floor that can make you trip. What can I do in  the bedroom?  Use night lights.  Make sure that you have a light by your bed that is easy to reach.  Do not use any sheets or blankets that are too big for your bed. They should not hang down onto the floor.  Have a firm chair that has side arms. You can use this for support while you get dressed.  Do not have throw rugs and other things on the floor that can make you trip. What can I do in the kitchen?  Clean up any spills right away.  Avoid walking on wet floors.  Keep items that you use a lot in easy-to-reach places.  If you need to reach something above you, use a strong step stool that has a grab bar.  Keep electrical cords out of the way.  Do not use floor polish or wax that makes floors slippery. If you must use wax, use non-skid floor wax.  Do not have throw rugs and other things on the floor that can make you trip. What can I do with my stairs?  Do not leave any items on the stairs.  Make sure that there are handrails on both  sides of the stairs and use them. Fix handrails that are broken or loose. Make sure that handrails are as long as the stairways.  Check any carpeting to make sure that it is firmly attached to the stairs. Fix any carpet that is loose or worn.  Avoid having throw rugs at the top or bottom of the stairs. If you do have throw rugs, attach them to the floor with carpet tape.  Make sure that you have a light switch at the top of the stairs and the bottom of the stairs. If you do not have them, ask someone to add them for you. What else can I do to help prevent falls?  Wear shoes that:  Do not have high heels.  Have rubber bottoms.  Are comfortable and fit you well.  Are closed at the toe. Do not wear sandals.  If you use a stepladder:  Make sure that it is fully opened. Do not climb a closed stepladder.  Make sure that both sides of the stepladder are locked into place.  Ask someone to hold it for you, if possible.  Clearly mark and  make sure that you can see:  Any grab bars or handrails.  First and last steps.  Where the edge of each step is.  Use tools that help you move around (mobility aids) if they are needed. These include:  Canes.  Walkers.  Scooters.  Crutches.  Turn on the lights when you go into a dark area. Replace any light bulbs as soon as they burn out.  Set up your furniture so you have a clear path. Avoid moving your furniture around.  If any of your floors are uneven, fix them.  If there are any pets around you, be aware of where they are.  Review your medicines with your doctor. Some medicines can make you feel dizzy. This can increase your chance of falling. Ask your doctor what other things that you can do to help prevent falls. This information is not intended to replace advice given to you by your health care provider. Make sure you discuss any questions you have with your health care provider. Document Released: 06/17/2009 Document Revised: 01/27/2016 Document Reviewed: 09/25/2014 Elsevier Interactive Patient Education  2017 [employer].

## 2017-01-18 ENCOUNTER — Encounter: Payer: Self-pay | Admitting: Internal Medicine

## 2017-01-18 ENCOUNTER — Ambulatory Visit: Payer: Medicare Other

## 2017-01-18 ENCOUNTER — Ambulatory Visit (INDEPENDENT_AMBULATORY_CARE_PROVIDER_SITE_OTHER): Payer: Medicare Other | Admitting: Internal Medicine

## 2017-01-18 VITALS — BP 128/80 | HR 73 | Temp 98.3°F | Ht 65.0 in | Wt 151.0 lb

## 2017-01-18 DIAGNOSIS — E1169 Type 2 diabetes mellitus with other specified complication: Secondary | ICD-10-CM

## 2017-01-18 DIAGNOSIS — E785 Hyperlipidemia, unspecified: Secondary | ICD-10-CM

## 2017-01-18 DIAGNOSIS — Z8669 Personal history of other diseases of the nervous system and sense organs: Secondary | ICD-10-CM | POA: Diagnosis not present

## 2017-01-18 DIAGNOSIS — I10 Essential (primary) hypertension: Secondary | ICD-10-CM

## 2017-01-18 DIAGNOSIS — E119 Type 2 diabetes mellitus without complications: Secondary | ICD-10-CM

## 2017-01-18 MED ORDER — BAYER MICROLET LANCETS MISC: 3 refills | Status: DC

## 2017-01-18 NOTE — Progress Notes (Signed)
Location:  [facility] clinic Provider:  Peyten Punches L. Mariea Clonts, D.O., C.M.D.  Code Status: DNR Goals of Care:  Advanced Directives 01/18/2017  Does Patient Have a Medical Advance Directive? Yes  Type of Advance Directive Out of facility DNR (pink MOST or yellow form)  Does patient want to make changes to medical advance directive? -  Copy of [place] in Chart? -  Would patient like information on creating a medical advance directive? -  Pre-existing out of facility DNR order (yellow form or pink MOST form) Pink MOST form placed in chart (order not valid for inpatient use)   Chief Complaint  Patient presents with  . extended visit    extended visit    HPI: Patient is a 82 y.o. female seen today for medical management of chronic diseases.    She cannot find a dentist that takes her insurance and it's very expensive.  Clarise Cruz, RN, made a referral yesterday at her [facility] to [facility] care team.    MMSE 24/30.  She got turned around taking the bus.  She had to walk a long distance to get from the bus to the appt.  She was thinking about how she had to go back home instead of the memory test.  Says she had trouble with the clock too being sidetracked.    She's doing dancing on her own and tai chi twice a week.   Continues to eat pretty well.    Labs were last done in Jan, no new now.    She is not getting shingrix either due to her prior Guillain Barre.    Foot exam done today.  Past Medical History:  Diagnosis Date  . Diabetes mellitus   . Hyperlipidemia   . Hypertension   . Myalgia and myositis, unspecified   . 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 Vitamin D deficiency     Past Surgical History:  Procedure Laterality Date  . [place] placed for Guillain-Barre due to poisoning    No Known Allergies  Allergies as of 01/18/2017   No Known Allergies     Medication List       Accurate as of 01/18/17  2:19 PM. Always use your most recent  med list.          aspirin 81 MG tablet Take 81 mg by mouth daily.   BAYER CONTOUR TEST test strip Generic drug:  glucose blood TEST SUGAR 1 TO 2 TIMES A DAY(DX 250.00)   BAYER MICROLET LANCETS lancets Use as instructed to test blood sugar once daily dx: E11.9   lisinopril 40 MG tablet Commonly known as:  PRINIVIL,ZESTRIL TAKE ONE TABLET BY MOUTH ONCE DAILY FOR BLOOD PRESSURE   metFORMIN 500 MG tablet Commonly known as:  GLUCOPHAGE TAKE ONE TABLET BY MOUTH TWICE DAILY TO CONTROL BLOOD SUGAR       Review of Systems:  Review of Systems  Constitutional: Negative for chills, fever and malaise/fatigue.  HENT: Negative for hearing loss.   Eyes:       Glasses, hoping cataracts are ripe for surgery  Respiratory: Negative for cough and shortness of breath.   Cardiovascular: Negative for chest pain, palpitations and leg swelling.  Gastrointestinal: Negative for abdominal pain, blood in stool, constipation, diarrhea and melena.  Genitourinary: Negative for dysuria.  Musculoskeletal: Negative for falls and myalgias.  Skin: Negative for itching and rash.  Neurological: Negative for dizziness, loss of consciousness and weakness.  Endo/Heme/Allergies: Does  not bruise/bleed easily.       Diabetes  Psychiatric/Behavioral: Negative for depression and memory loss.    Health Maintenance  Topic Date Due  . FOOT EXAM  01/09/2017  . HEMOGLOBIN A1C  03/26/2017  . OPHTHALMOLOGY EXAM  04/12/2017  . TETANUS/TDAP  11/12/2021  . DEXA SCAN  Completed    Physical Exam: Vitals:   01/18/17 1333  BP: 128/80  Pulse: 73  Temp: 98.3 F (36.8 C)  TempSrc: Oral  SpO2: 96%  Weight: 151 lb (68.5 kg)  Height: _0  (1.651 m)   Body mass index is 25.13 kg/m. Physical Exam  Constitutional: She is oriented to person, place, and time. She appears well-developed and well-nourished. No distress.  Cardiovascular: Normal rate, regular rhythm, normal heart sounds and intact distal pulses.     Pulmonary/Chest: Effort normal and breath sounds normal. No respiratory distress.  Abdominal: Soft. Bowel sounds are normal.  Musculoskeletal: Normal range of motion.  Neurological: She is alert and oriented to person, place, and time.  Skin: Skin is warm and dry.  Psychiatric: She has a normal mood and affect.    Labs reviewed: Basic Metabolic Panel:  Recent Labs  05/30/16 0800 09/26/16 0927  NA 138 140  K 4.7 3.9  CL 103 103  CO2 27 29  GLUCOSE 141* 151*  BUN 17 11  CREATININE 0.96* 0.85  CALCIUM 9.9 9.4  TSH 2.88  --    Liver Function Tests: No results for input(s): AST, ALT, ALKPHOS, BILITOT, PROT, ALBUMIN in the last 8760 hours. No results for input(s): LIPASE, AMYLASE in the last 8760 hours. No results for input(s): AMMONIA in the last 8760 hours. CBC: No results for input(s): WBC, NEUTROABS, HGB, HCT, MCV, PLT in the last 8760 hours. Lipid Panel:  Recent Labs  05/30/16 0800 09/26/16 0927  CHOL 214*  --   HDL 56  --   LDLCALC 124  --   TRIG 170*  --   CHOLHDL 3.8  --   LDLDIRECT  --  75   Lab Results  Component Value Date   HGBA1C 6.6 (H) 09/26/2016    Assessment/Plan 1. Type 2 diabetes mellitus without complication, without long-term current use of insulin (HCC) -well controlled with diet, exercise, metformin; is on baby asa and ace, refused statin - Hemoglobin S9Q - Basic metabolic panel  2. Hyperlipidemia associated with type 2 diabetes mellitus (HCC) -LDL has been quite good at just 75, cont diet and exercise, recheck before next visit  3. Essential hypertension, benign - bp well controlled on ace only--cont same - Basic metabolic panel  4. History of Guillain-Barre syndrome -refuses all vaccines due to this prior experience including prevnar, pneumovax, shingrix, annual flu, did have tdap in 2013  Labs/tests ordered:   Orders Placed This Encounter  Procedures  . Hemoglobin A1c  . Basic metabolic panel    Order Specific Question:   Has  the patient fasted?    Answer:   Yes   Next appt:  Advised to cancel appts later in May and return in 6 mos for fasting labs and appt same day  Izayah Miner L. Brodan Grewell, D.O. [facility] [address], [place] [address] Cell Phone (Mon-Fri 8am-5pm):  [phone] On Call:  [phone] & follow prompts after 5pm & weekends Office Phone:  [phone] Office Fax:  [phone]

## 2017-01-19 ENCOUNTER — Encounter: Payer: Self-pay | Admitting: *Deleted

## 2017-01-19 LAB — HEMOGLOBIN A1C
Hgb A1c MFr Bld: 6.9 % — ABNORMAL HIGH (ref <5.7)
Mean Plasma Glucose: 151 mg/dL

## 2017-01-19 LAB — BASIC METABOLIC PANEL
BUN: 15 mg/dL (ref 7–25)
CO2: 24 mmol/L (ref 20–31)
Calcium: 9.9 mg/dL (ref 8.6–10.4)
Chloride: 105 mmol/L (ref 98–110)
Creat: 0.81 mg/dL (ref 0.60–0.93)
Glucose, Bld: 99 mg/dL (ref 65–99)
Potassium: 4.6 mmol/L (ref 3.5–5.3)
Sodium: 139 mmol/L (ref 135–146)

## 2017-01-30 ENCOUNTER — Other Ambulatory Visit: Payer: Medicare Other

## 2017-02-02 ENCOUNTER — Ambulatory Visit: Payer: Medicare Other | Admitting: Internal Medicine

## 2017-02-15 ENCOUNTER — Other Ambulatory Visit: Payer: Self-pay | Admitting: Internal Medicine

## 2017-03-16 ENCOUNTER — Telehealth: Payer: Self-pay

## 2017-03-16 NOTE — Telephone Encounter (Signed)
Telephone outreach to pt to discuss request for community resources. Patient requesting dental resources. Will call back and provide list of dentists accepting Medicaid.    Jocelyn PoeNicole Ruie Hendrix, B.A.  Care Guide

## 2017-04-12 ENCOUNTER — Encounter: Payer: Self-pay | Admitting: *Deleted

## 2017-04-12 DIAGNOSIS — E119 Type 2 diabetes mellitus without complications: Secondary | ICD-10-CM | POA: Diagnosis not present

## 2017-04-12 DIAGNOSIS — H2513 Age-related nuclear cataract, bilateral: Secondary | ICD-10-CM | POA: Diagnosis not present

## 2017-04-12 LAB — HM DIABETES EYE EXAM

## 2017-05-22 ENCOUNTER — Other Ambulatory Visit: Payer: Self-pay | Admitting: Internal Medicine

## 2017-06-16 ENCOUNTER — Emergency Department (HOSPITAL_COMMUNITY)
Admission: EM | Admit: 2017-06-16 | Discharge: 2017-06-16 | Disposition: A | Discharge status: Home / Self Care | Payer: Medicare Other | Attending: Emergency Medicine | Admitting: Emergency Medicine

## 2017-06-16 ENCOUNTER — Emergency Department (HOSPITAL_COMMUNITY): Payer: Medicare Other

## 2017-06-16 ENCOUNTER — Encounter (HOSPITAL_COMMUNITY): Payer: Self-pay | Admitting: *Deleted

## 2017-06-16 DIAGNOSIS — H6591 Unspecified nonsuppurative otitis media, right ear: Secondary | ICD-10-CM

## 2017-06-16 DIAGNOSIS — H748X1 Other specified disorders of right middle ear and mastoid: Secondary | ICD-10-CM | POA: Insufficient documentation

## 2017-06-16 DIAGNOSIS — E119 Type 2 diabetes mellitus without complications: Secondary | ICD-10-CM | POA: Insufficient documentation

## 2017-06-16 DIAGNOSIS — Z7982 Long term (current) use of aspirin: Secondary | ICD-10-CM | POA: Diagnosis not present

## 2017-06-16 DIAGNOSIS — Z79899 Other long term (current) drug therapy: Secondary | ICD-10-CM | POA: Diagnosis not present

## 2017-06-16 DIAGNOSIS — I10 Essential (primary) hypertension: Secondary | ICD-10-CM | POA: Insufficient documentation

## 2017-06-16 DIAGNOSIS — H81391 Other peripheral vertigo, right ear: Secondary | ICD-10-CM

## 2017-06-16 DIAGNOSIS — Z7984 Long term (current) use of oral hypoglycemic drugs: Secondary | ICD-10-CM | POA: Diagnosis not present

## 2017-06-16 DIAGNOSIS — R42 Dizziness and giddiness: Secondary | ICD-10-CM | POA: Diagnosis not present

## 2017-06-16 LAB — CBC
HCT: 41.2 % (ref 36.0–46.0)
Hemoglobin: 13.9 g/dL (ref 12.0–15.0)
MCH: 30.7 pg (ref 26.0–34.0)
MCHC: 33.7 g/dL (ref 30.0–36.0)
MCV: 90.9 fL (ref 78.0–100.0)
PLATELETS: 113 10*3/uL — AB (ref 150–400)
RBC: 4.53 MIL/uL (ref 3.87–5.11)
RDW: 13.7 % (ref 11.5–15.5)
WBC: 8 10*3/uL (ref 4.0–10.5)

## 2017-06-16 LAB — BASIC METABOLIC PANEL
Anion gap: 6 (ref 5–15)
BUN: 16 mg/dL (ref 6–20)
CALCIUM: 9.1 mg/dL (ref 8.9–10.3)
CHLORIDE: 100 mmol/L — AB (ref 101–111)
CO2: 25 mmol/L (ref 22–32)
CREATININE: 0.83 mg/dL (ref 0.44–1.00)
GFR calc non Af Amer: >60 mL/min (ref >60)
Glucose, Bld: 166 mg/dL — ABNORMAL HIGH (ref 65–99)
Potassium: 4 mmol/L (ref 3.5–5.1)
SODIUM: 131 mmol/L — AB (ref 135–145)

## 2017-06-16 LAB — URINALYSIS, ROUTINE W REFLEX MICROSCOPIC
Bilirubin Urine: NEGATIVE
GLUCOSE, UA: NEGATIVE mg/dL
HGB URINE DIPSTICK: NEGATIVE
KETONES UR: 5 mg/dL — AB
Leukocytes, UA: NEGATIVE
Nitrite: NEGATIVE
PROTEIN: NEGATIVE mg/dL
Specific Gravity, Urine: 1.006 (ref 1.005–1.030)
pH: 7 (ref 5.0–8.0)

## 2017-06-16 MED ORDER — MECLIZINE HCL 25 MG PO TABS: 25.0000 mg | ORAL_TABLET | Freq: Three times a day (TID) | ORAL | 0 refills | Status: DC | PRN

## 2017-06-16 MED ORDER — ONDANSETRON 4 MG PO TBDP
4.0000 mg | ORAL_TABLET | Freq: Once | ORAL | Status: AC
  Administered 2017-06-16: 4 mg via ORAL
  Filled 2017-06-16: qty 1

## 2017-06-16 MED ORDER — CETIRIZINE HCL 5 MG PO TABS: 5.0000 mg | ORAL_TABLET | Freq: Every day | ORAL | 0 refills | Status: DC

## 2017-06-16 MED ORDER — LISINOPRIL 20 MG PO TABS
40.0000 mg | ORAL_TABLET | Freq: Once | ORAL | Status: AC
  Administered 2017-06-16: 40 mg via ORAL
  Filled 2017-06-16: qty 2

## 2017-06-16 MED ORDER — ONDANSETRON HCL 4 MG PO TABS: 4.0000 mg | ORAL_TABLET | Freq: Four times a day (QID) | ORAL | 0 refills | Status: DC

## 2017-06-16 MED ORDER — MECLIZINE HCL 25 MG PO TABS
25.0000 mg | ORAL_TABLET | Freq: Once | ORAL | Status: AC
  Administered 2017-06-16: 25 mg via ORAL
  Filled 2017-06-16: qty 1

## 2017-06-16 MED ORDER — SODIUM CHLORIDE 0.9 % IV BOLUS (SEPSIS)
500.0000 mL | Freq: Once | INTRAVENOUS | Status: AC
  Administered 2017-06-16: 500 mL via INTRAVENOUS

## 2017-06-16 MED ORDER — FLUTICASONE PROPIONATE 50 MCG/ACT NA SUSP: 1.0000 | Freq: Every day | NASAL | 0 refills | Status: DC

## 2017-06-16 NOTE — ED Notes (Signed)
ED Provider at bedside. 

## 2017-06-16 NOTE — ED Notes (Signed)
Patient transported to MRI 

## 2017-06-16 NOTE — ED Triage Notes (Signed)
Pt reports having dizziness x 2 days, feels like "room is spinning." has slight headache and reports n/v. Denies diarrhea.
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
## 2017-06-16 NOTE — ED Notes (Signed)
Pt returns from radiology. 

## 2017-06-19 ENCOUNTER — Other Ambulatory Visit: Payer: Self-pay | Admitting: Internal Medicine

## 2017-06-20 ENCOUNTER — Telehealth: Payer: Self-pay | Admitting: *Deleted
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
## 2017-06-20 NOTE — Telephone Encounter (Signed)
-----   Message from Kermit Baloiffany L Reed, DO sent at 06/18/2017  6:30 AM EDT ----- Please call her.  I see she was in the ED with vertigo.  She also had an ear effusion.  I reviewed her labs and her sodium level was 8 points lower than normal.  Had she been following different eating or drinking patterns for the previous couple of days when she had the vertigo?  This abnormal electrolyte also may have contributed to her vertigo and should be rechecked later this week.  I'd also like her to be seen if her vertigo is not getting better.  She may need PT for the vertigo.  Tiffany L. Reed, D.O. Geriatrics [facility] [address] [place], [place] [address] Cell Phone (Mon-Fri 8am-5pm):  [phone] On Call:  [phone] & follow prompts after 5pm & weekends Office Phone:  [phone] Office Fax:  [phone]

## 2017-06-21 ENCOUNTER — Ambulatory Visit (INDEPENDENT_AMBULATORY_CARE_PROVIDER_SITE_OTHER): Payer: Medicare Other | Admitting: Nurse Practitioner

## 2017-06-21 ENCOUNTER — Encounter: Payer: Self-pay | Admitting: Nurse Practitioner

## 2017-06-21 VITALS — BP 128/72 | HR 75 | Temp 98.1°F | Resp 18 | Ht 65.0 in | Wt 155.8 lb

## 2017-06-21 DIAGNOSIS — H65191 Other acute nonsuppurative otitis media, right ear: Secondary | ICD-10-CM

## 2017-06-21 DIAGNOSIS — R42 Dizziness and giddiness: Secondary | ICD-10-CM

## 2017-06-21 DIAGNOSIS — I10 Essential (primary) hypertension: Secondary | ICD-10-CM | POA: Diagnosis not present

## 2017-06-21 NOTE — Patient Instructions (Addendum)
Please get Zyrtec 5 mg tablets and start taking by mouth once per day for ear pressure.    Please call back to office if symptoms return or get worse. If you have pain or loss of hearing or ear drainage.
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
## 2017-07-23 ENCOUNTER — Ambulatory Visit (INDEPENDENT_AMBULATORY_CARE_PROVIDER_SITE_OTHER): Payer: Medicare Other | Admitting: Internal Medicine

## 2017-07-23 ENCOUNTER — Encounter: Payer: Self-pay | Admitting: Internal Medicine

## 2017-07-23 VITALS — BP 120/70 | HR 61 | Temp 97.6°F | Wt 153.0 lb

## 2017-07-23 DIAGNOSIS — E1169 Type 2 diabetes mellitus with other specified complication: Secondary | ICD-10-CM | POA: Diagnosis not present

## 2017-07-23 DIAGNOSIS — I10 Essential (primary) hypertension: Secondary | ICD-10-CM | POA: Diagnosis not present

## 2017-07-23 DIAGNOSIS — E785 Hyperlipidemia, unspecified: Secondary | ICD-10-CM | POA: Diagnosis not present

## 2017-07-23 DIAGNOSIS — Z8669 Personal history of other diseases of the nervous system and sense organs: Secondary | ICD-10-CM

## 2017-07-23 DIAGNOSIS — E119 Type 2 diabetes mellitus without complications: Secondary | ICD-10-CM | POA: Diagnosis not present

## 2017-07-23 NOTE — Progress Notes (Signed)
[facility] Provider:  Dajohn Ellender L. Renato Gailseed, D.O., C.M.D.  Code Status: DNR Goals of Care:  Advanced Directives 06/21/2017  Does Patient Have a Medical Advance Directive? Yes  Type of Advance Directive Out of facility DNR (pink MOST or yellow form)  Does patient want to make changes to medical advance directive? -  Copy of Healthcare Power of Attorney in Chart? -  Would patient like information on creating a medical advance directive? -  Pre-existing out of facility DNR order (yellow form or pink MOST form) Pink MOST form placed in chart (order not valid for inpatient use)   Chief Complaint  Patient presents with  . Medical Management of Chronic Issues    6mth follow-up    HPI: Patient is a 82 y.o. female seen today for medical management of chronic diseases.    Had a rough patch with vertigo.  Granddaughter came over and explained that she was having stress.  Had a granddaughter with her temporarily and was bothered by little changes in the house.  Had to calm herself down about those things.  She also had a small effusion in the ear.  Took zyrtec and never needed flonase.  Has meclizine if needed.  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    BP at goal with current regimen.  DMII:  On metformin, ace and metformin.  CBGs good.  114 this am, 120 another time lately.  For labs today  H/o Guillain Barre--not able to take injections.    Hyperlipidemia;  For fasting lab this am.  Lab Results  Component Value Date   CHOL 214 (H) 05/30/2016   CHOL 222 (H) 01/06/2016   CHOL 235 (H) 07/28/2015   Lab Results  Component Value Date   HDL 56 05/30/2016   HDL 59 01/06/2016   HDL 53 07/28/2015   Lab Results  Component Value Date   LDLCALC 124 05/30/2016   LDLCALC 139 (H) 01/06/2016   LDLCALC 145 (H) 07/28/2015   Lab Results  Component Value Date   TRIG 170 (H) 05/30/2016   TRIG 121 01/06/2016   TRIG 185 (H)  07/28/2015   Lab Results  Component Value Date   CHOLHDL 3.8 05/30/2016   CHOLHDL 3.8 01/06/2016   CHOLHDL 4.4 07/28/2015   Lab Results  Component Value Date   LDLDIRECT 75 09/26/2016    Past Medical History:  Diagnosis Date  . Diabetes mellitus   . Hyperlipidemia   . Hypertension   . Myalgia and myositis, unspecified   . 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 Vitamin D deficiency     Past Surgical History:  Procedure Laterality Date  . TRACHEAL SURGERY  1985   Trach placed for Guillain-Barre due to poisoning    No Known Allergies  Outpatient Encounter Medications as of 07/23/2017  Medication Sig  . aspirin 81 MG tablet Take 81 mg by mouth daily.  Marland Kitchen. BAYER CONTOUR TEST test strip TEST SUGAR 1 TO 2 TIMES A DAY(DX 250.00)  . BAYER MICROLET LANCETS lancets Use as instructed to test blood sugar once daily dx: E11.9  . lisinopril (PRINIVIL,ZESTRIL) 40 MG tablet TAKE ONE TABLET BY MOUTH ONCE DAILY FOR BLOOD PRESSURE  . meclizine (ANTIVERT) 25 MG tablet Take 1 tablet (25 mg total) by mouth 3 (three) times daily as needed for dizziness.  . metFORMIN (GLUCOPHAGE) 500 MG tablet TAKE ONE TABLET BY MOUTH TWICE DAILY TO CONTROL BLOOD SUGAR  . [  DISCONTINUED] ondansetron (ZOFRAN) 4 MG tablet Take 1 tablet (4 mg total) by mouth every 6 (six) hours.   No facility-administered encounter medications on file as of 07/23/2017.     Review of Systems:  Review of Systems  Constitutional: Negative for chills, fever and malaise/fatigue.  HENT: Negative for congestion and hearing loss.   Respiratory: Negative for shortness of breath.   Cardiovascular: Negative for chest pain and palpitations.  Gastrointestinal: Negative for abdominal pain, blood in stool, constipation and melena.  Genitourinary: Negative for dysuria, frequency and urgency.  Musculoskeletal: Negative for falls.  Skin: Negative for itching and rash.  Neurological: Positive for dizziness. Negative for loss of  consciousness and weakness.       Vertigo episode resolved  Endo/Heme/Allergies: Does not bruise/bleed easily.  Psychiatric/Behavioral: Negative for depression and memory loss.    Health Maintenance  Topic Date Due  . HEMOGLOBIN A1C  07/21/2017  . FOOT EXAM  01/18/2018  . OPHTHALMOLOGY EXAM  04/12/2018  . TETANUS/TDAP  11/12/2021  . DEXA SCAN  Completed    Physical Exam: Vitals:   07/23/17 0950  BP: 120/70  Pulse: 61  Temp: 97.6 F (36.4 C)  TempSrc: Oral  SpO2: 98%  Weight: 153 lb (69.4 kg)   Body mass index is 25.46 kg/m. Physical Exam  Constitutional: She is oriented to person, place, and time. She appears well-developed and well-nourished. No distress.  Cardiovascular: Normal rate, regular rhythm, normal heart sounds and intact distal pulses.  Pulmonary/Chest: Effort normal and breath sounds normal. No respiratory distress.  Abdominal: Bowel sounds are normal.  Musculoskeletal: Normal range of motion. She exhibits no tenderness.  Neurological: She is alert and oriented to person, place, and time.  Skin: Skin is warm and dry. Capillary refill takes less than 2 seconds.  Psychiatric: She has a normal mood and affect.    Labs reviewed: Basic Metabolic Panel: Recent Labs    09/26/16 0927 01/18/17 1438 06/16/17 1119  NA 140 139 131*  K 3.9 4.6 4.0  CL 103 105 100*  CO2 29 24 25   GLUCOSE 151* 99 166*  BUN 11 15 16   CREATININE 0.85 0.81 0.83  CALCIUM 9.4 9.9 9.1   Liver Function Tests: No results for input(s): AST, ALT, ALKPHOS, BILITOT, PROT, ALBUMIN in the last 8760 hours. No results for input(s): LIPASE, AMYLASE in the last 8760 hours. No results for input(s): AMMONIA in the last 8760 hours. CBC: Recent Labs    06/16/17 1119  WBC 8.0  HGB 13.9  HCT 41.2  MCV 90.9  PLT 113*   Lipid Panel: Recent Labs    09/26/16 0927  LDLDIRECT 75   Lab Results  Component Value Date   HGBA1C 6.9 (H) 01/18/2017   Assessment/Plan 1. Essential hypertension,  benign - bp at goal with current ace, no changes - CBC with Differential/Platelet - COMPLETE METABOLIC PANEL WITH GFR - Basic metabolic panel; Future  2. Type 2 diabetes mellitus without complication, without long-term current use of insulin (HCC) - well controlled for a long time, cont walking and improved diet - CBC with Differential/Platelet - Hemoglobin A1c - Hemoglobin A1c; Future - Basic metabolic panel; Future  3. Hyperlipidemia associated with type 2 diabetes mellitus (HCC) - not on statin therapy at her request, working on diet and exercise and last LDL was 75 direct which is pretty good--ideal less than 70 - Lipid panel  4. History of Guillain-Barre syndrome -does not receive vaccinations due to this  Labs/tests ordered:   Orders Placed  This Encounter  Procedures  . CBC with Differential/Platelet  . COMPLETE METABOLIC PANEL WITH GFR  . Hemoglobin A1c  . Lipid panel  . Hemoglobin A1c    Standing Status:   Future    Standing Expiration Date:   07/23/2018  . Basic metabolic panel    Standing Status:   Future    Standing Expiration Date:   07/23/2018    Order Specific Question:   Has the patient fasted?    Answer:   Yes    Next appt:  6 mos med mgt, labs same day before appt fasting  Ashling Roane L. Bemnet Trovato, D.O. Geriatrics [facility] [address] [place], [place] [address] Cell Phone (Mon-Fri 8am-5pm):  [phone] On Call:  [phone] & follow prompts after 5pm & weekends Office Phone:  [phone] Office Fax:  [phone]

## 2017-07-24 LAB — COMPLETE METABOLIC PANEL WITH GFR
AG Ratio: 1.2 (calc) (ref 1.0–2.5)
ALT: 21 U/L (ref 6–29)
AST: 33 U/L (ref 10–35)
Albumin: 4 g/dL (ref 3.6–5.1)
Alkaline phosphatase (APISO): 66 U/L (ref 33–130)
BUN: 16 mg/dL (ref 7–25)
CO2: 28 mmol/L (ref 20–32)
Calcium: 9.6 mg/dL (ref 8.6–10.4)
Chloride: 106 mmol/L (ref 98–110)
Creat: 0.82 mg/dL (ref 0.60–0.88)
GFR, Est African American: 78 mL/min/{1.73_m2} (ref >=60)
GFR, Est Non African American: 68 mL/min/{1.73_m2} (ref >=60)
Globulin: 3.4 g/dL (calc) (ref 1.9–3.7)
Glucose, Bld: 137 mg/dL — ABNORMAL HIGH (ref 65–99)
Potassium: 4.5 mmol/L (ref 3.5–5.3)
Sodium: 139 mmol/L (ref 135–146)
Total Bilirubin: 0.3 mg/dL (ref 0.2–1.2)
Total Protein: 7.4 g/dL (ref 6.1–8.1)

## 2017-07-24 LAB — CBC WITH DIFFERENTIAL/PLATELET
Basophils Absolute: 67 cells/uL (ref 0–200)
Basophils Relative: 1 %
Eosinophils Absolute: 181 cells/uL (ref 15–500)
Eosinophils Relative: 2.7 %
HCT: 38.8 % (ref 35.0–45.0)
Hemoglobin: 13.2 g/dL (ref 11.7–15.5)
Lymphs Abs: 2224 cells/uL (ref 850–3900)
MCH: 30 pg (ref 27.0–33.0)
MCHC: 34 g/dL (ref 32.0–36.0)
MCV: 88.2 fL (ref 80.0–100.0)
MPV: 13.7 fL — ABNORMAL HIGH (ref 7.5–12.5)
Monocytes Relative: 10.9 %
Neutro Abs: 3497 cells/uL (ref 1500–7800)
Neutrophils Relative %: 52.2 %
Platelets: 135 10*3/uL — ABNORMAL LOW (ref 140–400)
RBC: 4.4 10*6/uL (ref 3.80–5.10)
RDW: 12.4 % (ref 11.0–15.0)
Total Lymphocyte: 33.2 %
WBC mixed population: 730 cells/uL (ref 200–950)
WBC: 6.7 10*3/uL (ref 3.8–10.8)

## 2017-07-24 LAB — HEMOGLOBIN A1C
Hgb A1c MFr Bld: 7.1 % of total Hgb — ABNORMAL HIGH (ref <5.7)
Mean Plasma Glucose: 157 (calc)
eAG (mmol/L): 8.7 (calc)

## 2017-07-24 LAB — LIPID PANEL
Cholesterol: 219 mg/dL — ABNORMAL HIGH (ref <200)
HDL: 67 mg/dL (ref >50)
LDL Cholesterol (Calc): 128 mg/dL (calc) — ABNORMAL HIGH
Non-HDL Cholesterol (Calc): 152 mg/dL (calc) — ABNORMAL HIGH (ref <130)
Total CHOL/HDL Ratio: 3.3 (calc) (ref <5.0)
Triglycerides: 129 mg/dL (ref <150)

## 2017-09-21 ENCOUNTER — Other Ambulatory Visit: Payer: Self-pay | Admitting: Internal Medicine

## 2017-11-03 ENCOUNTER — Other Ambulatory Visit: Payer: Self-pay | Admitting: Internal Medicine

## 2017-11-16 ENCOUNTER — Other Ambulatory Visit: Payer: Self-pay | Admitting: *Deleted

## 2017-11-16 MED ORDER — LISINOPRIL 40 MG PO TABS: ORAL_TABLET | ORAL | 1 refills | Status: DC

## 2017-11-16 NOTE — Telephone Encounter (Signed)
[facility] 

## 2017-12-18 ENCOUNTER — Other Ambulatory Visit: Payer: Self-pay | Admitting: Internal Medicine

## 2018-01-21 ENCOUNTER — Ambulatory Visit (INDEPENDENT_AMBULATORY_CARE_PROVIDER_SITE_OTHER): Payer: Medicare Other | Admitting: Internal Medicine

## 2018-01-21 ENCOUNTER — Encounter: Payer: Self-pay | Admitting: Internal Medicine

## 2018-01-21 ENCOUNTER — Other Ambulatory Visit: Payer: Self-pay

## 2018-01-21 VITALS — BP 122/70 | HR 67 | Temp 98.2°F | Ht 65.0 in | Wt 149.0 lb

## 2018-01-21 DIAGNOSIS — I10 Essential (primary) hypertension: Secondary | ICD-10-CM

## 2018-01-21 DIAGNOSIS — E1169 Type 2 diabetes mellitus with other specified complication: Secondary | ICD-10-CM

## 2018-01-21 DIAGNOSIS — E785 Hyperlipidemia, unspecified: Secondary | ICD-10-CM

## 2018-01-21 DIAGNOSIS — E119 Type 2 diabetes mellitus without complications: Secondary | ICD-10-CM | POA: Diagnosis not present

## 2018-01-21 DIAGNOSIS — R42 Dizziness and giddiness: Secondary | ICD-10-CM | POA: Diagnosis not present

## 2018-01-21 NOTE — Progress Notes (Signed)
Location:  [facility] clinic Provider:  Jakyla Reza L. Renato Gails, D.O., C.M.D.  Code Status: DNR Goals of Care:  Advanced Directives 01/21/2018  Does Patient Have a Medical Advance Directive? Yes  Type of Advance Directive Out of facility DNR (pink MOST or yellow form)  Does patient want to make changes to medical advance directive? No - Patient declined  Copy of Healthcare Power of Attorney in Chart? -  Would patient like information on creating a medical advance directive? -  Pre-existing out of facility DNR order (yellow form or pink MOST form) Pink MOST form placed in chart (order not valid for inpatient use)   Chief Complaint  Patient presents with  . Medical Management of Chronic Issues    follow-up    HPI: Patient is a 83 y.o. female seen today for medical management of chronic diseases.    She lost 4 lbs since last visit.  Says it must be the housework.  She has a granddaughter that's in and out and she does a lot of clean-up for her.  She's staying with her so she can get into college.  She has a couple of part-time jobs.    Hba1c 7.1 In November.  She does keep an eye on her sweets and starches and walking more.    She had a counselor from the [employer] come to see her and they talked about vertigo some.  She thinks it was coming from being off due to her granddaughter living with her some.  Says she's changed her perspective since and thinks that that's why she's not had further episodes.  Admits last visit she was tired and she gets frustrated that her granddaughter does not clean up after herself.  She likes thinks very clean.    BP is at goal.    No flare-ups of diverticulitis.    Past Medical History:  Diagnosis Date  . Diabetes mellitus   . Hyperlipidemia   . Hypertension   . Myalgia and myositis, unspecified   . 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 Vitamin D deficiency     Past Surgical History:  Procedure Laterality Date  . TRACHEAL SURGERY  1985   Trach placed for Guillain-Barre due to poisoning    No Known Allergies  Outpatient Encounter Medications as of 01/21/2018  Medication Sig  . aspirin 81 MG tablet Take 81 mg by mouth daily.  Marland Kitchen BAYER CONTOUR TEST test strip TEST SUGAR 1 TO 2 TIMES A DAY(DX 250.00)  . Lancets (ONETOUCH ULTRASOFT) lancets USE AS INSTRUCTED TO TEST BLOOD SUGAR TWICE DAILY. DX: E11.9  . lisinopril (PRINIVIL,ZESTRIL) 40 MG tablet Take one tablet by mouth once daily for blood pressure  . meclizine (ANTIVERT) 25 MG tablet Take 1 tablet (25 mg total) by mouth 3 (three) times daily as needed for dizziness.  . metFORMIN (GLUCOPHAGE) 500 MG tablet TAKE ONE TABLET BY MOUTH TWICE DAILY TO CONTROL BLOOD SUGAR   No facility-administered encounter medications on file as of 01/21/2018.     Review of Systems:  Review of Systems  Constitutional: Negative for chills and fever.  HENT: Negative for congestion and hearing loss.   Eyes: Negative for blurred vision.       Glasses  Respiratory: Negative for cough and shortness of breath.   Cardiovascular: Negative for chest pain and palpitations.  Gastrointestinal: Negative for abdominal pain, blood in stool, constipation, diarrhea and melena.  Genitourinary: Negative for dysuria.  Musculoskeletal: Negative for falls and joint pain.  Skin: Negative  for itching and rash.  Neurological: Negative for dizziness and loss of consciousness.  Endo/Heme/Allergies: Does not bruise/bleed easily.       Diabetes  Psychiatric/Behavioral: Negative for depression and memory loss. The patient is not nervous/anxious and does not have insomnia.     Health Maintenance  Topic Date Due  . FOOT EXAM  01/18/2018  . HEMOGLOBIN A1C  01/20/2018  . OPHTHALMOLOGY EXAM  04/12/2018  . TETANUS/TDAP  11/12/2021  . DEXA SCAN  Completed    Physical Exam: Vitals:   01/21/18 0943  BP: 122/70  Pulse: 67  Temp: 98.2 F (36.8 C)  TempSrc: Oral  SpO2: 97%  Weight: 149 lb (67.6 kg)  Height:   (1.651 m)   Body mass index is 24.79 kg/m. Physical Exam  Constitutional: She is oriented to person, place, and time. She appears well-developed and well-nourished. No distress.  HENT:  Head: Normocephalic and atraumatic.  Cardiovascular: Normal rate, regular rhythm, normal heart sounds and intact distal pulses.  Pulmonary/Chest: Effort normal and breath sounds normal. No respiratory distress.  Abdominal: Bowel sounds are normal.  Musculoskeletal: Normal range of motion. She exhibits no edema or tenderness.  Neurological: She is alert and oriented to person, place, and time. No cranial nerve deficit.  Diabetic foot exam done  Skin: Skin is warm and dry. Capillary refill takes less than 2 seconds.  Psychiatric: She has a normal mood and affect.    Labs reviewed: Basic Metabolic Panel: Recent Labs    06/16/17 1119 07/23/17 1030  NA 131* 139  K 4.0 4.5  CL 100* 106  CO2 25 28  GLUCOSE 166* 137*  BUN 16 16  CREATININE 0.83 0.82  CALCIUM 9.1 9.6   Liver Function Tests: Recent Labs    07/23/17 1030  AST 33  ALT 21  BILITOT 0.3  PROT 7.4   No results for input(s): LIPASE, AMYLASE in the last 8760 hours. No results for input(s): AMMONIA in the last 8760 hours. CBC: Recent Labs    06/16/17 1119 07/23/17 1030  WBC 8.0 6.7  NEUTROABS  --  3,497  HGB 13.9 13.2  HCT 41.2 38.8  MCV 90.9 88.2  PLT 113* 135*   Lipid Panel: Recent Labs    07/23/17 1030  CHOL 219*  HDL 67  LDLCALC 128*  TRIG 129  CHOLHDL 3.3   Lab Results  Component Value Date   HGBA1C 7.1 (H) 07/23/2017   Assessment/Plan 1. Type 2 diabetes mellitus without complication, without long-term current use of insulin (HCC) -has been well controlled -f/u hba1c this am  -cont asa, ace, metformin  2. Hyperlipidemia associated with type 2 diabetes mellitus (HCC) -wanted to check direct LDL, but pt ate crackers with her meds so cannot do today -has refused statin therapy -last calculated LDL high,  but direct has been close to goal -recheck next time fasting  3. Essential hypertension, benign -bp at goal, cont lisinopril  4. Vertigo -no recent episodes, pt reports that there may have been a psychiatric cause due to stress from her granddaughter living with her (she's apparently not as neat as pt and she has to clean up after her which is frustrating)  Labs/tests ordered: will plan on direct ldl, hba1c, cbc, cmp at next visit fasting Next appt:  6 mos med mgt  Jarelyn Bambach L. Chesnee Floren, D.O. Geriatrics [facility] [address], [place] [address] Cell Phone (Mon-Fri 8am-5pm):  [phone] On Call:  [phone] & follow prompts  after 5pm & weekends Office Phone:  [phone] Office Fax:  [phone]

## 2018-01-22 ENCOUNTER — Telehealth: Payer: Self-pay | Admitting: *Deleted

## 2018-01-22 LAB — BASIC METABOLIC PANEL
BUN: 18 mg/dL (ref 7–25)
CO2: 29 mmol/L (ref 20–32)
Calcium: 10 mg/dL (ref 8.6–10.4)
Chloride: 103 mmol/L (ref 98–110)
Creat: 0.82 mg/dL (ref 0.60–0.88)
Glucose, Bld: 119 mg/dL (ref 65–139)
Potassium: 4.4 mmol/L (ref 3.5–5.3)
Sodium: 137 mmol/L (ref 135–146)

## 2018-01-22 LAB — HEMOGLOBIN A1C
Hgb A1c MFr Bld: 7.2 % of total Hgb — ABNORMAL HIGH (ref <5.7)
Mean Plasma Glucose: 160 (calc)
eAG (mmol/L): 8.9 (calc)

## 2018-01-22 MED ORDER — METFORMIN HCL 500 MG PO TABS: 500.0000 mg | ORAL_TABLET | Freq: Every day | ORAL | 1 refills | Status: DC

## 2018-01-22 NOTE — Telephone Encounter (Signed)
-----   Message from Kermit Balo, DO sent at 01/22/2018  9:08 AM EDT ----- hba1c has continued to trend up.  I recommend she increase her metformin to  with breakfast and  with her evening meal.  Her current dose of metformin seems to be ineffective.  Please notify her and send in the prescription.

## 2018-03-13 ENCOUNTER — Ambulatory Visit (INDEPENDENT_AMBULATORY_CARE_PROVIDER_SITE_OTHER): Payer: Medicare Other

## 2018-03-13 VITALS — BP 140/70 | HR 92 | Temp 98.2°F | Ht 65.0 in | Wt 148.0 lb

## 2018-03-13 DIAGNOSIS — R2681 Unsteadiness on feet: Secondary | ICD-10-CM | POA: Diagnosis not present

## 2018-03-13 DIAGNOSIS — Z0000 Encounter for general adult medical examination without abnormal findings: Secondary | ICD-10-CM | POA: Diagnosis not present

## 2018-03-13 NOTE — Patient Instructions (Signed)
[name] , Thank you for taking time to come for your Medicare Wellness Visit. I appreciate your ongoing commitment to your health goals. Please review the following plan we discussed and let me know if I can assist you in the future.   Screening recommendations/referrals: Colonoscopy excluded, over age 81 Mammogram excluded, over age 19 Bone Density up to date Recommended yearly ophthalmology/optometry visit for glaucoma screening and checkup Recommended yearly dental visit for hygiene and checkup  Vaccinations: Influenza vaccine excluded Pneumococcal vaccine excluded Tdap vaccine up to date, due 11/12/2021 Shingles vaccine declined    Advanced directives: Advance directive discussed with you today. I have provided a copy for you to complete at home and have notarized. Once this is complete please bring a copy in to our office so we can scan it into your chart.  Conditions/risks identified: none  Next appointment: Dr. Renato Gails 07/25/2018 @ 10am             Jocelyn Russell, RN 03/17/2019 @ 10am   Preventive Care 65 Years and Older, Female Preventive care refers to lifestyle choices and visits with your health care provider that can promote health and wellness. What does preventive care include?  A yearly physical exam. This is also called an annual well check.  Dental exams once or twice a year.  Routine eye exams. Ask your health care provider how often you should have your eyes checked.  Personal lifestyle choices, including:  Daily care of your teeth and gums.  Regular physical activity.  Eating a healthy diet.  Avoiding tobacco and drug use.  Limiting alcohol use.  Practicing safe sex.  Taking low-dose aspirin every day.  Taking vitamin and mineral supplements as recommended by your health care provider. What happens during an annual well check? The services and screenings done by your health care provider during your annual well check will depend on your age, overall  health, lifestyle risk factors, and family history of disease. Counseling  Your health care provider may ask you questions about your:  Alcohol use.  Tobacco use.  Drug use.  Emotional well-being.  Home and relationship well-being.  Sexual activity.  Eating habits.  History of falls.  Memory and ability to understand (cognition).  Work and work Astronomer.  Reproductive health. Screening  You may have the following tests or measurements:  Height, weight, and BMI.  Blood pressure.  Lipid and cholesterol levels. These may be checked every 5 years, or more frequently if you are over 40 years old.  Skin check.  Lung cancer screening. You may have this screening every year starting at age 35 if you have a 30-pack-year history of smoking and currently smoke or have quit within the past 15 years.  Fecal occult blood test (FOBT) of the stool. You may have this test every year starting at age 56.  Flexible sigmoidoscopy or colonoscopy. You may have a sigmoidoscopy every 5 years or a colonoscopy every 10 years starting at age 43.  Hepatitis C blood test.  Hepatitis B blood test.  Sexually transmitted disease (STD) testing.  Diabetes screening. This is done by checking your blood sugar (glucose) after you have not eaten for a while (fasting). You may have this done every 1-3 years.  Bone density scan. This is done to screen for osteoporosis. You may have this done starting at age 61.  Mammogram. This may be done every 1-2 years. Talk to your health care provider about how often you should have regular mammograms. Talk with  your health care provider about your test results, treatment options, and if necessary, the need for more tests. Vaccines  Your health care provider may recommend certain vaccines, such as:  Influenza vaccine. This is recommended every year.  Tetanus, diphtheria, and acellular pertussis (Tdap, Td) vaccine. You may need a Td booster every 10  years.  Zoster vaccine. You may need this after age 81.  Pneumococcal 13-valent conjugate (PCV13) vaccine. One dose is recommended after age 81.  Pneumococcal polysaccharide (PPSV23) vaccine. One dose is recommended after age 81. Talk to your health care provider about which screenings and vaccines you need and how often you need them. This information is not intended to replace advice given to you by your health care provider. Make sure you discuss any questions you have with your health care provider. Document Released: 09/17/2015 Document Revised: 05/10/2016 Document Reviewed: 06/22/2015 Elsevier Interactive Patient Education  2017 [employer]  Fall Prevention in the Home Falls can cause injuries. They can happen to people of all ages. There are many things you can do to make your home safe and to help prevent falls. What can I do on the outside of my home?  Regularly fix the edges of walkways and driveways and fix any cracks.  Remove anything that might make you trip as you walk through a door, such as a raised step or threshold.  Trim any bushes or trees on the path to your home.  Use bright outdoor lighting.  Clear any walking paths of anything that might make someone trip, such as rocks or tools.  Regularly check to see if handrails are loose or broken. Make sure that both sides of any steps have handrails.  Any raised decks and porches should have guardrails on the edges.  Have any leaves, snow, or ice cleared regularly.  Use sand or salt on walking paths during winter.  Clean up any spills in your garage right away. This includes oil or grease spills. What can I do in the bathroom?  Use night lights.  Install grab bars by the toilet and in the tub and shower. Do not use towel bars as grab bars.  Use non-skid mats or decals in the tub or shower.  If you need to sit down in the shower, use a plastic, non-slip stool.  Keep the floor dry. Clean up any water that  spills on the floor as soon as it happens.  Remove soap buildup in the tub or shower regularly.  Attach bath mats securely with double-sided non-slip rug tape.  Do not have throw rugs and other things on the floor that can make you trip. What can I do in the bedroom?  Use night lights.  Make sure that you have a light by your bed that is easy to reach.  Do not use any sheets or blankets that are too big for your bed. They should not hang down onto the floor.  Have a firm chair that has side arms. You can use this for support while you get dressed.  Do not have throw rugs and other things on the floor that can make you trip. What can I do in the kitchen?  Clean up any spills right away.  Avoid walking on wet floors.  Keep items that you use a lot in easy-to-reach places.  If you need to reach something above you, use a strong step stool that has a grab bar.  Keep electrical cords out of the way.  Do not  use floor polish or wax that makes floors slippery. If you must use wax, use non-skid floor wax.  Do not have throw rugs and other things on the floor that can make you trip. What can I do with my stairs?  Do not leave any items on the stairs.  Make sure that there are handrails on both sides of the stairs and use them. Fix handrails that are broken or loose. Make sure that handrails are as long as the stairways.  Check any carpeting to make sure that it is firmly attached to the stairs. Fix any carpet that is loose or worn.  Avoid having throw rugs at the top or bottom of the stairs. If you do have throw rugs, attach them to the floor with carpet tape.  Make sure that you have a light switch at the top of the stairs and the bottom of the stairs. If you do not have them, ask someone to add them for you. What else can I do to help prevent falls?  Wear shoes that:  Do not have high heels.  Have rubber bottoms.  Are comfortable and fit you well.  Are closed at the  toe. Do not wear sandals.  If you use a stepladder:  Make sure that it is fully opened. Do not climb a closed stepladder.  Make sure that both sides of the stepladder are locked into place.  Ask someone to hold it for you, if possible.  Clearly mark and make sure that you can see:  Any grab bars or handrails.  First and last steps.  Where the edge of each step is.  Use tools that help you move around (mobility aids) if they are needed. These include:  Canes.  Walkers.  Scooters.  Crutches.  Turn on the lights when you go into a dark area. Replace any light bulbs as soon as they burn out.  Set up your furniture so you have a clear path. Avoid moving your furniture around.  If any of your floors are uneven, fix them.  If there are any pets around you, be aware of where they are.  Review your medicines with your doctor. Some medicines can make you feel dizzy. This can increase your chance of falling. Ask your doctor what other things that you can do to help prevent falls. This information is not intended to replace advice given to you by your health care provider. Make sure you discuss any questions you have with your health care provider. Document Released: 06/17/2009 Document Revised: 01/27/2016 Document Reviewed: 09/25/2014 Elsevier Interactive Patient Education  2017 [employer].

## 2018-03-13 NOTE — Progress Notes (Signed)
Subjective:   Jocelyn Hendrix is a 83 y.o. female who presents for Medicare Annual (Subsequent) preventive examination.  Last AWV-01/17/2017    Objective:     Vitals: BP 140/70 (BP Location: Left Arm, Patient Position: Sitting)   Pulse 92   Temp 98.2 F (36.8 C) (Oral)   Ht 5\' 5"  (1.651 m)   Wt 148 lb (67.1 kg)   SpO2 96%   BMI 24.63 kg/m   Body mass index is 24.63 kg/m.  Advanced Directives 03/13/2018 01/21/2018 06/21/2017 06/16/2017 01/18/2017 01/17/2017 09/29/2016  Does Patient Have a Medical Advance Directive? Yes Yes Yes No Yes Yes Yes  Type of Advance Directive Out of facility DNR (pink MOST or yellow form) Out of facility DNR (pink MOST or yellow form) Out of facility DNR (pink MOST or yellow form) - Out of facility DNR (pink MOST or yellow form) Healthcare Power of [place];Living will Out of facility DNR (pink MOST or yellow form)  Does patient want to make changes to medical advance directive? Yes (MAU/Ambulatory/Procedural Areas - Information given) No - Patient declined - - - No - Patient declined -  Copy of Healthcare Power of Attorney in Chart? - - - - - Yes -  Would patient like information on creating a medical advance directive? - - - - - No - Patient declined -  Pre-existing out of facility DNR order (yellow form or pink MOST form) Pink MOST form placed in chart (order not valid for inpatient use);Physician notified to receive inpatient order Pink MOST form placed in chart (order not valid for inpatient use) Pink MOST form placed in chart (order not valid for inpatient use) - Pink MOST form placed in chart (order not valid for inpatient use) - Pink MOST form placed in chart (order not valid for inpatient use)    Tobacco Social History   Tobacco Use  Smoking Status Never Smoker  Smokeless Tobacco Never Used     Counseling given: Not Answered   Clinical Intake:  Pre-visit preparation completed: No  Pain : No/denies pain     Nutritional Risks:  None Diabetes: Yes CBG done?: No Did pt. bring in CBG monitor from home?: No  How often do you need to have someone help you when you read instructions, pamphlets, or other written materials from your doctor or pharmacy?: 1 - Never What is the last grade level you completed in school?: Some college  Interpreter Needed?: No  Information entered by :: Tyron RussellSara Saunders, RN  Past Medical History:  Diagnosis Date  . Diabetes mellitus   . Hyperlipidemia   . Hypertension   . Myalgia and myositis, unspecified   . 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 Vitamin D deficiency    Past Surgical History:  Procedure Laterality Date  . 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 Heart failure Mother   . Diabetes Mother   . Alzheimer's disease Sister    Social History   Socioeconomic History  . Marital status: Widowed    Spouse name: Not on file  . Number of children: Not on file  . Years of education: Not on file  . Highest education level: Not on file  Occupational History  . Not on file  Social Needs  . Financial resource strain: Not hard at all  . Food insecurity:    Worry: Never true    Inability: Never true  .  Transportation needs:    Medical: No    Non-medical: No  Tobacco Use  . Smoking status: Never Smoker  . Smokeless tobacco: Never Used  Substance and Sexual Activity  . Alcohol use: No    Alcohol/week: 0.0 oz  . Drug use: No  . Sexual activity: Not Currently    Birth control/protection: Post-menopausal  Lifestyle  . Physical activity:    Days per week: 7 days    Minutes per session: 20 min  . Stress: Only a little  Relationships  . Social connections:    Talks on phone: More than three times a week    Gets together: More than three times a week    Attends religious service: More than 4 times per year    Active member of club or organization: No    Attends meetings  of clubs or organizations: Never    Relationship status: Widowed  Other Topics Concern  . Not on file  Social History Narrative  . Not on file    Outpatient Encounter Medications as of 03/13/2018  Medication Sig  . aspirin 81 MG tablet Take 81 mg by mouth daily.  Marland Kitchen BAYER CONTOUR TEST test strip TEST SUGAR 1 TO 2 TIMES A DAY(DX 250.00)  . Lancets (ONETOUCH ULTRASOFT) lancets USE AS INSTRUCTED TO TEST BLOOD SUGAR TWICE DAILY. DX: E11.9  . lisinopril (PRINIVIL,ZESTRIL) 40 MG tablet Take one tablet by mouth once daily for blood pressure  . meclizine (ANTIVERT) 25 MG tablet Take 1 tablet (25 mg total) by mouth 3 (three) times daily as needed for dizziness.  . metFORMIN (GLUCOPHAGE) 500 MG tablet Take 1 tablet (500 mg total) by mouth daily with breakfast. Take 2 tablets by mouth with evening meal   No facility-administered encounter medications on file as of 03/13/2018.     Activities of Daily Living In your present state of health, do you have any difficulty performing the following activities: 03/13/2018  Hearing? N  Vision? N  Difficulty concentrating or making decisions? N  Walking or climbing stairs? N  Dressing or bathing? N  Doing errands, shopping? N  Preparing Food and eating ? N  Using the Toilet? N  In the past six months, have you accidently leaked urine? N  Do you have problems with loss of bowel control? N  Managing your Medications? N  Managing your Finances? N  Housekeeping or managing your Housekeeping? N  Some recent data might be hidden    Patient Care Team: Kermit Balo, DO as PCP - General (Geriatric Medicine) Ernesto Rutherford, MD as Consulting Physician (Ophthalmology)    Assessment:   This is a routine wellness examination for Jocelyn Hendrix.  Exercise Activities and Dietary recommendations Current Exercise Habits: Home exercise routine, Type of exercise: walking, Time (Minutes): 20, Frequency (Times/Week): 7, Weekly Exercise (Minutes/Week): 140, Exercise  limited by: None identified  Goals    None      Fall Risk Fall Risk  03/13/2018 01/21/2018 07/23/2017 06/21/2017 01/18/2017  Falls in the past year? Yes No No No No  Number falls in past yr: 1 - - - -  Comment vertigo - - - -  Injury with Fall? No - - - -   Is the patient's home free of loose throw rugs in walkways, pet beds, electrical cords, etc?   yes      Grab bars in the bathroom? yes      Handrails on the stairs?   yes      Adequate lighting?  yes  Timed Get Up and Go performed: 12 seconds  Depression Screen PHQ 2/9 Scores 03/13/2018 01/21/2018 07/23/2017 01/18/2017  PHQ - 2 Score 0 0 0 0     Cognitive Function MMSE - Mini Mental State Exam 03/13/2018 01/17/2017 01/10/2016 08/07/2014  Orientation to time 5 4 5 5   Orientation to Place 5 5 5 5   Registration 3 3 3 3   Attention/ Calculation 5 4 5 5   Recall 1 0 3 2  Language- name 2 objects 2 2 2 2   Language- repeat 1 1 1 1   Language- follow 3 step command 3 3 3 3   Language- read & follow direction 1 1 1 1   Write a sentence 1 1 1 1   Copy design 1 0 1 1  Total score 28 24 30 29         Immunization History  Administered Date(s) Administered  . Tdap 11/13/2011    Qualifies for Shingles Vaccine? Yes, educated and declined  Screening Tests Health Maintenance  Topic Date Due  . OPHTHALMOLOGY EXAM  04/12/2018  . HEMOGLOBIN A1C  07/24/2018  . FOOT EXAM  01/22/2019  . TETANUS/TDAP  11/12/2021  . DEXA SCAN  Completed    Cancer Screenings: Lung: Low Dose CT Chest recommended if Age 38-80 years, 30 pack-year currently smoking OR have quit w/in 15years. Patient does not qualify. Breast:  Up to date on Mammogram? Yes   Up to date of Bone Density/Dexa? Yes Colorectal: up to date  Additional Screenings:  Hepatitis C Screening: declined     Plan:    I have personally reviewed and addressed the Medicare Annual Wellness questionnaire and have noted the following in the patient's chart:  A. Medical and social  history B. Use of alcohol, tobacco or illicit drugs  C. Current medications and supplements D. Functional ability and status E.  Nutritional status F.  Physical activity G. Advance directives H. List of other physicians I.  Hospitalizations, surgeries, and ER visits in previous 12 months J.  Vitals K. Screenings to include hearing, vision, cognitive, depression L. Referrals and appointments - C3  In addition, I have reviewed and discussed with patient certain preventive protocols, quality metrics, and best practice recommendations. A written personalized care plan for preventive services as well as general preventive health recommendations were provided to patient.  See attached scanned questionnaire for additional information.   Signed,   Tyron Russell, RN Nurse Health Advisor  Patient Concerns: None

## 2018-03-22 ENCOUNTER — Other Ambulatory Visit: Payer: Self-pay | Admitting: Internal Medicine

## 2018-04-11 DIAGNOSIS — H2513 Age-related nuclear cataract, bilateral: Secondary | ICD-10-CM | POA: Diagnosis not present

## 2018-04-11 DIAGNOSIS — E119 Type 2 diabetes mellitus without complications: Secondary | ICD-10-CM | POA: Diagnosis not present

## 2018-04-11 LAB — HM DIABETES EYE EXAM

## 2018-04-16 ENCOUNTER — Encounter: Payer: Self-pay | Admitting: *Deleted

## 2018-05-01 ENCOUNTER — Other Ambulatory Visit: Payer: Self-pay

## 2018-05-01 DIAGNOSIS — E119 Type 2 diabetes mellitus without complications: Secondary | ICD-10-CM

## 2018-05-01 DIAGNOSIS — E1169 Type 2 diabetes mellitus with other specified complication: Secondary | ICD-10-CM

## 2018-05-01 DIAGNOSIS — I10 Essential (primary) hypertension: Secondary | ICD-10-CM

## 2018-05-01 DIAGNOSIS — E785 Hyperlipidemia, unspecified: Secondary | ICD-10-CM

## 2018-05-18 ENCOUNTER — Other Ambulatory Visit: Payer: Self-pay | Admitting: Internal Medicine

## 2018-05-23 ENCOUNTER — Other Ambulatory Visit: Payer: Self-pay | Admitting: *Deleted

## 2018-05-23 NOTE — Patient Outreach (Addendum)
[facility]) Care Management  05/23/2018  Jocelyn MasseGeraldine Hendrix 04/24/1935 [id number]   Telephone Screen  Referral Date: 05/21/18 Referral Source: [facility] referral -[facility] Referral Reason: the patient needs assistance with paying for a cane. She had vertigo awhile back, and the sidewalk where she lives is uneven.  Insurance: Medicare next gen   Outreach attempt # 1, successful to Jocelyn Hendrix home number Patient is able to verify HIPAA but with encouragement of CM  [facility] and [facility]rix confirms [facility] and told [facility]. The patient reports one of her neighbors informed her she received a cane for free from her insurance company.  Jocelyn Hendrix reports the MD RN had not sent or made an attempt to send a Rx or order to a DME company. Jocelyn Hendrix states she wants a light weight single prong cane  CM discussed with Jocelyn Hendrix at process of sending a rx or order to a DME supply company and general costs for a single prone cane She informs CM she can afford a cane she would prefer if it was $20-30 CM discussed medical suppliers like [employer], [employer] and discount stores listed near her that carry single prong canes CM also discussed local [employer] and [employer] stores but Jocelyn Hendrix states she does not want a used cane.  She thanks CM for the "feedback and ideas" and reports she would like to have one of her daughters purchase her one soon because her birthday is coming up  She concluded the call because she stated she did not want her food to get cold   Social: Jocelyn Hendrix lives alone but has support from her family (daughters, grandchildren, friends and neighbors) During the call to Jocelyn Hendrix CM was asked to hold as one of the grandchildren arrived to bring her food. She confirms she does not drive.  CM discussed [facility] [employer] [facility] [facility] resources for transportation,  meals on wheels, etc but Jocelyn Hendrix informed CM " I don't mess with them." She reports having enough support system and family to provide transportation    Conditions:  HTN, DM type 2 , hyperlipidemia, diverticulitis, hx of guillain barre syndrome Medications: denies concerns with taking medications as prescribed, affording medications, side effects of medications and questions about medications Appointments: Dr Renato Gailseed seen generally every 3-6 months  Advance Directives: Denies need for assist with or assist with changes for advance directives   Consent: [facility] CM reviewed [facility] with patient. Patient gave verbal consent for services telephonically She denies need of services from [facility] Community/Telephonic RN CM, pharmacy, health coach, NP or SW at this time   [facility] CM will close case at this time as patient has been assessed and no needs identified.   [facility] RN CM consulted [facility] leadership (geronda) and SW Enrique Sack(kendra) and updated them when pt states she can afford and would prefer to have her daughters purchase her a cane for her birthday so she can pick out the particular one she wants  Jocelyn L. Noelle PennerGibbs, RN, BSN, CCM [facility] Telephonic Care Management Care Coordinator Office number [phone] Mobile number [phone]  Main [facility] number [phone] Fax number [phone]

## 2018-07-18 ENCOUNTER — Other Ambulatory Visit: Payer: Medicare Other

## 2018-07-18 DIAGNOSIS — E119 Type 2 diabetes mellitus without complications: Secondary | ICD-10-CM | POA: Diagnosis not present

## 2018-07-18 DIAGNOSIS — E785 Hyperlipidemia, unspecified: Secondary | ICD-10-CM

## 2018-07-18 DIAGNOSIS — E1169 Type 2 diabetes mellitus with other specified complication: Secondary | ICD-10-CM

## 2018-07-18 DIAGNOSIS — I10 Essential (primary) hypertension: Secondary | ICD-10-CM

## 2018-07-19 LAB — COMPLETE METABOLIC PANEL WITH GFR
AG Ratio: 1.2 (calc) (ref 1.0–2.5)
ALBUMIN MSPROF: 4 g/dL (ref 3.6–5.1)
ALT: 12 U/L (ref 6–29)
AST: 24 U/L (ref 10–35)
Alkaline phosphatase (APISO): 62 U/L (ref 33–130)
BUN: 16 mg/dL (ref 7–25)
CALCIUM: 9.5 mg/dL (ref 8.6–10.4)
CO2: 29 mmol/L (ref 20–32)
CREATININE: 0.85 mg/dL (ref 0.60–0.88)
Chloride: 105 mmol/L (ref 98–110)
GFR, Est African American: 74 mL/min/{1.73_m2} (ref >=60)
GFR, Est Non African American: 64 mL/min/{1.73_m2} (ref >=60)
GLOBULIN: 3.4 g/dL (ref 1.9–3.7)
Glucose, Bld: 125 mg/dL — ABNORMAL HIGH (ref 65–99)
Potassium: 4.3 mmol/L (ref 3.5–5.3)
SODIUM: 139 mmol/L (ref 135–146)
Total Bilirubin: 0.4 mg/dL (ref 0.2–1.2)
Total Protein: 7.4 g/dL (ref 6.1–8.1)

## 2018-07-19 LAB — LIPID PANEL
Cholesterol: 196 mg/dL (ref <200)
HDL: 53 mg/dL (ref >50)
LDL CHOLESTEROL (CALC): 120 mg/dL — AB
NON-HDL CHOLESTEROL (CALC): 143 mg/dL — AB (ref <130)
TRIGLYCERIDES: 122 mg/dL (ref <150)
Total CHOL/HDL Ratio: 3.7 (calc) (ref <5.0)

## 2018-07-19 LAB — HEMOGLOBIN A1C
EAG (MMOL/L): 8.5 (calc)
Hgb A1c MFr Bld: 7 % of total Hgb — ABNORMAL HIGH (ref <5.7)
MEAN PLASMA GLUCOSE: 154 (calc)

## 2018-07-19 LAB — CBC WITH DIFFERENTIAL/PLATELET
BASOS PCT: 1.5 %
Basophils Absolute: 81 cells/uL (ref 0–200)
EOS PCT: 3.9 %
Eosinophils Absolute: 211 cells/uL (ref 15–500)
HCT: 39.9 % (ref 35.0–45.0)
HEMOGLOBIN: 13.6 g/dL (ref 11.7–15.5)
Lymphs Abs: 2214 cells/uL (ref 850–3900)
MCH: 30.4 pg (ref 27.0–33.0)
MCHC: 34.1 g/dL (ref 32.0–36.0)
MCV: 89.3 fL (ref 80.0–100.0)
MONOS PCT: 11.2 %
MPV: 13.9 fL — AB (ref 7.5–12.5)
NEUTROS ABS: 2290 {cells}/uL (ref 1500–7800)
Neutrophils Relative %: 42.4 %
PLATELETS: 131 10*3/uL — AB (ref 140–400)
RBC: 4.47 10*6/uL (ref 3.80–5.10)
RDW: 12.6 % (ref 11.0–15.0)
Total Lymphocyte: 41 %
WBC mixed population: 605 cells/uL (ref 200–950)
WBC: 5.4 10*3/uL (ref 3.8–10.8)

## 2018-07-25 ENCOUNTER — Encounter: Payer: Self-pay | Admitting: Internal Medicine

## 2018-07-25 ENCOUNTER — Ambulatory Visit (INDEPENDENT_AMBULATORY_CARE_PROVIDER_SITE_OTHER): Payer: Medicare Other | Admitting: Internal Medicine

## 2018-07-25 VITALS — BP 120/80 | HR 65 | Temp 97.9°F | Ht 65.0 in | Wt 148.0 lb

## 2018-07-25 DIAGNOSIS — E1169 Type 2 diabetes mellitus with other specified complication: Secondary | ICD-10-CM

## 2018-07-25 DIAGNOSIS — I10 Essential (primary) hypertension: Secondary | ICD-10-CM | POA: Diagnosis not present

## 2018-07-25 DIAGNOSIS — Z8669 Personal history of other diseases of the nervous system and sense organs: Secondary | ICD-10-CM

## 2018-07-25 DIAGNOSIS — E785 Hyperlipidemia, unspecified: Secondary | ICD-10-CM | POA: Diagnosis not present

## 2018-07-25 MED ORDER — CHLORTHALIDONE 25 MG PO TABS: 12.5000 mg | ORAL_TABLET | Freq: Every day | ORAL | 3 refills | Status: DC

## 2018-07-25 NOTE — Progress Notes (Signed)
[facility] Provider:  Kayron Hicklin L. Renato Gailseed, D.O., C.M.D.  Code Status: DNR, MOST Goals of Care:  Advanced Directives 03/13/2018  Does Patient Have a Medical Advance Directive? Yes  Type of Advance Directive Out of facility DNR (pink MOST or yellow form)  Does patient want to make changes to medical advance directive? Yes (MAU/Ambulatory/Procedural Areas - Information given)  Copy of Healthcare Power of Attorney in Chart? -  Would patient like information on creating a medical advance directive? -  Pre-existing out of facility DNR order (yellow form or pink MOST form) Pink MOST form placed in chart (order not valid for inpatient use);Physician notified to receive inpatient order   Chief Complaint  Patient presents with  . Medical Management of Chronic Issues    6mth follow-up    HPI: Patient is a 83 y.o. female seen today for medical management of chronic diseases.    hba1c is 7 down from 7.2 six months ago.    LDL improved, but still well above goal at 120 when it should be less than 70 with cholesterol.  Says she needs to get back to her better eating habits.    She had not been walking as much either.  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  She had been walking there and back.  She is going to get back out with her 81 yo neighbor.    Weight stable.    BP in 140s lately.  Says she's been a little woozy lately.    Gets up 2x at night to urinate.  She does drink 8oz of water before bed nightly.  Past Medical History:  Diagnosis Date  . Diabetes mellitus   . Hyperlipidemia   . Hypertension   . Myalgia and myositis, unspecified   . 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 Vitamin D deficiency     Past Surgical History:  Procedure Laterality Date  . 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 aspirin 81 MG tablet Take 81 mg by mouth daily.  Marland Kitchen. BAYER CONTOUR TEST test strip TEST SUGAR 1 TO 2 TIMES A DAY(DX 250.00)  . Lancets (ONETOUCH ULTRASOFT) lancets USE AS INSTRUCTED TO TEST BLOOD SUGAR TWICE DAILY. DX: E11.9  . lisinopril (PRINIVIL,ZESTRIL) 40 MG tablet TAKE ONE TABLET BY MOUTH ONCE DAILY FOR BLOOD PRESSURE  . meclizine (ANTIVERT) 25 MG tablet Take 1 tablet (25 mg total) by mouth 3 (three) times daily as needed for dizziness.  . metFORMIN (GLUCOPHAGE) 500 MG tablet Take 1 tablet (500 mg total) by mouth daily with breakfast. Take 2 tablets by mouth with evening meal   No facility-administered encounter medications on file as of 07/25/2018.     Review of Systems:  Review of Systems  Constitutional: Negative for chills and fever.  HENT: Negative for congestion and hearing loss.   Eyes: Negative for blurred vision.  Respiratory: Negative for cough and shortness of breath.   Cardiovascular: Negative for chest pain, palpitations and leg swelling.  Gastrointestinal: Negative for abdominal pain, blood in stool, constipation, diarrhea and melena.  Genitourinary: Negative for dysuria.  Musculoskeletal: Negative for falls and joint pain.  Neurological: Positive for dizziness. Negative for loss of consciousness.  Psychiatric/Behavioral: Negative for depression and memory loss. The patient is not nervous/anxious and does  not have insomnia.     Health Maintenance  Topic Date Due  . HEMOGLOBIN A1C  01/16/2019  . FOOT EXAM  01/22/2019  . OPHTHALMOLOGY EXAM  04/12/2019  . TETANUS/TDAP  11/12/2021  . DEXA SCAN  Completed    Physical Exam: Vitals:   07/25/18 1008  BP: 140/80  Pulse: 65  Temp: 97.9 F (36.6 C)  TempSrc: Oral  SpO2: 96%  Weight: 148 lb (67.1 kg)  Height: 5\' 5"  (1.651 m)   Body mass index is 24.63 kg/m. Physical Exam  Constitutional: She is oriented to person, place, and time. She appears well-developed and  well-nourished. No distress.  Cardiovascular: Normal rate, regular rhythm, normal heart sounds and intact distal pulses.  Pulmonary/Chest: Effort normal and breath sounds normal. No respiratory distress.  Abdominal: Bowel sounds are normal.  Musculoskeletal: Normal range of motion.  Neurological: She is alert and oriented to person, place, and time.  Skin: Skin is warm and dry.  Psychiatric: She has a normal mood and affect.    Labs reviewed: Basic Metabolic Panel: Recent Labs    01/21/18 1034 07/18/18 0843  NA 137 139  K 4.4 4.3  CL 103 105  CO2 29 29  GLUCOSE 119 125*  BUN 18 16  CREATININE 0.82 0.85  CALCIUM 10.0 9.5   Liver Function Tests: Recent Labs    07/18/18 0843  AST 24  ALT 12  BILITOT 0.4  PROT 7.4   No results for input(s): LIPASE, AMYLASE in the last 8760 hours. No results for input(s): AMMONIA in the last 8760 hours. CBC: Recent Labs    07/18/18 0843  WBC 5.4  NEUTROABS 2,290  HGB 13.6  HCT 39.9  MCV 89.3  PLT 131*   Lipid Panel: Recent Labs    07/18/18 0843  CHOL 196  HDL 53  LDLCALC 120*  TRIG 122  CHOLHDL 3.7   Lab Results  Component Value Date   HGBA1C 7.0 (H) 07/18/2018   Assessment/Plan 1. Type 2 diabetes mellitus with other specified complication, without long-term current use of insulin (HCC) -control better than last time and weight stable -cont same regimen which does include baby asa, ace, metformin -she's going to work on her diet and walk more again   2. Hyperlipidemia associated with type 2 diabetes mellitus (HCC) -again better than last time but still well above goal -tried statin but had myopathy and won't take again -she will work on her diet  3. Essential hypertension, benign - not at goal -cont lisinopril 40mg  daily - add chlorthalidone (HYGROTON) 25 MG tablet; Take 0.5 tablets (12.5 mg total) by mouth daily.  Dispense: 30 tablet; Refill: 3 - Basic metabolic panel; Future  4. History of Guillain-Barre  syndrome -not a candidate for vaccines--was told never to take them   Labs/tests ordered:   Orders Placed This Encounter  Procedures  . Basic metabolic panel    Standing Status:   Future    Standing Expiration Date:   07/26/2019    Order Specific Question:   Has the patient fasted?    Answer:   No   Next appt:  08/08/2018 f/u bp and check bmp due to chlorthalidone  Alder Murri L. Hoby Kawai, D.O. Geriatrics [facility] [address] [place], [place] [address] Cell Phone (Mon-Fri 8am-5pm):  [phone] On Call:  [phone] & follow prompts after 5pm & weekends Office Phone:  [phone] Office Fax:  [phone]

## 2018-07-25 NOTE — Patient Instructions (Signed)
DASH Eating Plan DASH stands for "Dietary Approaches to Stop Hypertension." The DASH eating plan is a healthy eating plan that has been shown to reduce high blood pressure (hypertension). It may also reduce your risk for type 2 diabetes, heart disease, and stroke. The DASH eating plan may also help with weight loss. What are tips for following this plan? General guidelines  Avoid eating more than 2,300 mg (milligrams) of salt (sodium) a day. If you have hypertension, you may need to reduce your sodium intake to 1,500 mg a day.  Limit alcohol intake to no more than 1 drink a day for nonpregnant women and 2 drinks a day for men. One drink equals 12 oz of beer, 5 oz of wine, or 1 oz of hard liquor.  Work with your health care provider to maintain a healthy body weight or to lose weight. Ask what an ideal weight is for you.  Get at least 30 minutes of exercise that causes your heart to beat faster (aerobic exercise) most days of the week. Activities may include walking, swimming, or biking.  Work with your health care provider or diet and nutrition specialist (dietitian) to adjust your eating plan to your individual calorie needs. Reading food labels  Check food labels for the amount of sodium per serving. Choose foods with less than 5 percent of the Daily Value of sodium. Generally, foods with less than 300 mg of sodium per serving fit into this eating plan.  To find whole grains, look for the word "whole" as the first word in the ingredient list. Shopping  Buy products labeled as "low-sodium" or "no salt added."  Buy fresh foods. Avoid canned foods and premade or frozen meals. Cooking  Avoid adding salt when cooking. Use salt-free seasonings or herbs instead of table salt or sea salt. Check with your health care provider or pharmacist before using salt substitutes.  Do not fry foods. Cook foods using healthy methods such as baking, boiling, grilling, and broiling instead.  Cook with  heart-healthy oils, such as olive, canola, soybean, or sunflower oil. Meal planning   Eat a balanced diet that includes: ? 5 or more servings of fruits and vegetables each day. At each meal, try to fill half of your plate with fruits and vegetables. ? Up to 6-8 servings of whole grains each day. ? Less than 6 oz of lean meat, poultry, or fish each day. A 3-oz serving of meat is about the same size as a deck of cards. One egg equals 1 oz. ? 2 servings of low-fat dairy each day. ? A serving of nuts, seeds, or beans 5 times each week. ? Heart-healthy fats. Healthy fats called Omega-3 fatty acids are found in foods such as flaxseeds and coldwater fish, like sardines, salmon, and mackerel.  Limit how much you eat of the following: ? Canned or prepackaged foods. ? Food that is high in trans fat, such as fried foods. ? Food that is high in saturated fat, such as fatty meat. ? Sweets, desserts, sugary drinks, and other foods with added sugar. ? Full-fat dairy products.  Do not salt foods before eating.  Try to eat at least 2 vegetarian meals each week.  Eat more home-cooked food and less restaurant, buffet, and fast food.  When eating at a restaurant, ask that your food be prepared with less salt or no salt, if possible. What foods are recommended? The items listed may not be a complete list. Talk with your dietitian about what   dietary choices are best for you. Grains Whole-grain or whole-wheat bread. Whole-grain or whole-wheat pasta. Brown rice. Oatmeal. Quinoa. Bulgur. Whole-grain and low-sodium cereals. Pita bread. Low-fat, low-sodium crackers. Whole-wheat flour tortillas. Vegetables Fresh or frozen vegetables (raw, steamed, roasted, or grilled). Low-sodium or reduced-sodium tomato and vegetable juice. Low-sodium or reduced-sodium tomato sauce and tomato paste. Low-sodium or reduced-sodium canned vegetables. Fruits All fresh, dried, or frozen fruit. Canned fruit in natural juice (without  added sugar). Meat and other protein foods Skinless chicken or turkey. Ground chicken or turkey. Pork with fat trimmed off. Fish and seafood. Egg whites. Dried beans, peas, or lentils. Unsalted nuts, nut butters, and seeds. Unsalted canned beans. Lean cuts of beef with fat trimmed off. Low-sodium, lean deli meat. Dairy Low-fat (1%) or fat-free (skim) milk. Fat-free, low-fat, or reduced-fat cheeses. Nonfat, low-sodium ricotta or cottage cheese. Low-fat or nonfat yogurt. Low-fat, low-sodium cheese. Fats and oils Soft margarine without trans fats. Vegetable oil. Low-fat, reduced-fat, or light mayonnaise and salad dressings (reduced-sodium). Canola, safflower, olive, soybean, and sunflower oils. Avocado. Seasoning and other foods Herbs. Spices. Seasoning mixes without salt. Unsalted popcorn and pretzels. Fat-free sweets. What foods are not recommended? The items listed may not be a complete list. Talk with your dietitian about what dietary choices are best for you. Grains Baked goods made with fat, such as croissants, muffins, or some breads. Dry pasta or rice meal packs. Vegetables Creamed or fried vegetables. Vegetables in a cheese sauce. Regular canned vegetables (not low-sodium or reduced-sodium). Regular canned tomato sauce and paste (not low-sodium or reduced-sodium). Regular tomato and vegetable juice (not low-sodium or reduced-sodium). Pickles. Olives. Fruits Canned fruit in a light or heavy syrup. Fried fruit. Fruit in cream or butter sauce. Meat and other protein foods Fatty cuts of meat. Ribs. Fried meat. Bacon. Sausage. Bologna and other processed lunch meats. Salami. Fatback. Hotdogs. Bratwurst. Salted nuts and seeds. Canned beans with added salt. Canned or smoked fish. Whole eggs or egg yolks. Chicken or turkey with skin. Dairy Whole or 2% milk, cream, and half-and-half. Whole or full-fat cream cheese. Whole-fat or sweetened yogurt. Full-fat cheese. Nondairy creamers. Whipped toppings.  Processed cheese and cheese spreads. Fats and oils Butter. Stick margarine. Lard. Shortening. Ghee. Bacon fat. Tropical oils, such as coconut, palm kernel, or palm oil. Seasoning and other foods Salted popcorn and pretzels. Onion salt, garlic salt, seasoned salt, table salt, and sea salt. Worcestershire sauce. Tartar sauce. Barbecue sauce. Teriyaki sauce. Soy sauce, including reduced-sodium. Steak sauce. Canned and packaged gravies. Fish sauce. Oyster sauce. Cocktail sauce. Horseradish that you find on the shelf. Ketchup. Mustard. Meat flavorings and tenderizers. Bouillon cubes. Hot sauce and Tabasco sauce. Premade or packaged marinades. Premade or packaged taco seasonings. Relishes. Regular salad dressings. Where to find more information:  National Heart, Lung, and Blood Institute: www.nhlbi.nih.gov  American Heart Association: www.heart.org Summary  The DASH eating plan is a healthy eating plan that has been shown to reduce high blood pressure (hypertension). It may also reduce your risk for type 2 diabetes, heart disease, and stroke.  With the DASH eating plan, you should limit salt (sodium) intake to 2,300 mg a day. If you have hypertension, you may need to reduce your sodium intake to 1,500 mg a day.  When on the DASH eating plan, aim to eat more fresh fruits and vegetables, whole grains, lean proteins, low-fat dairy, and heart-healthy fats.  Work with your health care provider or diet and nutrition specialist (dietitian) to adjust your eating plan to your individual   calorie needs. This information is not intended to replace advice given to you by your health care provider. Make sure you discuss any questions you have with your health care provider. Document Released: 08/10/2011 Document Revised: 08/14/2016 Document Reviewed: 08/14/2016 Elsevier Interactive Patient Education  2018 Elsevier Inc.  

## 2018-08-08 ENCOUNTER — Ambulatory Visit (INDEPENDENT_AMBULATORY_CARE_PROVIDER_SITE_OTHER): Payer: Medicare Other | Admitting: Internal Medicine

## 2018-08-08 ENCOUNTER — Encounter: Payer: Self-pay | Admitting: Internal Medicine

## 2018-08-08 VITALS — BP 120/80 | HR 68 | Temp 98.4°F | Ht 65.0 in | Wt 145.0 lb

## 2018-08-08 DIAGNOSIS — E785 Hyperlipidemia, unspecified: Secondary | ICD-10-CM

## 2018-08-08 DIAGNOSIS — E1169 Type 2 diabetes mellitus with other specified complication: Secondary | ICD-10-CM | POA: Diagnosis not present

## 2018-08-08 DIAGNOSIS — I10 Essential (primary) hypertension: Secondary | ICD-10-CM | POA: Diagnosis not present

## 2018-08-08 NOTE — Patient Instructions (Signed)
Continue off chlorthalidone due to side effects. Continue low sodium diet.

## 2018-08-08 NOTE — Progress Notes (Signed)
Location:  [facility] clinic Provider:  Anjalee Cope L. Renato Gails, D.O., C.M.D.  Goals of Care:  Advanced Directives 03/13/2018  Does Patient Have a Medical Advance Directive? Yes  Type of Advance Directive Out of facility DNR (pink MOST or yellow form)  Does patient want to make changes to medical advance directive? Yes (MAU/Ambulatory/Procedural Areas - Information given)  Copy of Healthcare Power of Attorney in Chart? -  Would patient like information on creating a medical advance directive? -  Pre-existing out of facility DNR order (yellow form or pink MOST form) Pink MOST form placed in chart (order not valid for inpatient use);Physician notified to receive inpatient order   Chief Complaint  Patient presents with  . Medical Management of Chronic Issues    2 week follow-up on blood pressure    HPI: Patient is a 83 y.o. female seen today for f/u on blood pressure which was elevated last time.  I had prescribed chlorthalidone two weeks ago.    She is waking up with cold sweats.  At first, she had a bad headache at night.  The sweats are new for her.  She didn't take it for two days and was back to normal.  It's been two days and she has not had chlorthalidone.  It also did not increase her urination.    Past Medical History:  Diagnosis Date  . Diabetes mellitus   . Hyperlipidemia   . Hypertension   . Myalgia and myositis, unspecified   . 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 Vitamin D deficiency     Past Surgical History:  Procedure Laterality Date  . 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 aspirin 81 MG tablet Take 81 mg by mouth daily.  Marland Kitchen BAYER CONTOUR TEST test strip TEST SUGAR 1 TO 2 TIMES A DAY(DX 250.00)  . chlorthalidone (HYGROTON) 25 MG tablet Take 0.5 tablets (12.5 mg total) by mouth daily.  . Lancets (ONETOUCH ULTRASOFT) lancets USE AS  INSTRUCTED TO TEST BLOOD SUGAR TWICE DAILY. DX: E11.9  . lisinopril (PRINIVIL,ZESTRIL) 40 MG tablet TAKE ONE TABLET BY MOUTH ONCE DAILY FOR BLOOD PRESSURE  . meclizine (ANTIVERT) 25 MG tablet Take 1 tablet (25 mg total) by mouth 3 (three) times daily as needed for dizziness.  . metFORMIN (GLUCOPHAGE) 500 MG tablet Take 1 tablet (500 mg total) by mouth daily with breakfast. Take 2 tablets by mouth with evening meal   No facility-administered encounter medications on file as of 08/08/2018.     Review of Systems:  Review of Systems  Constitutional: Negative for chills and fever.  HENT:       Cold sweats  Eyes: Negative for blurred vision.       Glasses  Respiratory: Negative for shortness of breath.   Cardiovascular: Negative for chest pain and palpitations.  Gastrointestinal: Negative for abdominal pain.  Genitourinary: Negative for dysuria, frequency and urgency.  Musculoskeletal: Negative for falls and joint pain.  Skin: Negative for itching and rash.  Neurological: Positive for headaches.  Endo/Heme/Allergies: Does not bruise/bleed easily.  Psychiatric/Behavioral: Negative for memory loss. The patient is not nervous/anxious and does not have insomnia.     Health Maintenance  Topic Date Due  . HEMOGLOBIN A1C  01/16/2019  . FOOT EXAM  01/22/2019  . OPHTHALMOLOGY EXAM  04/12/2019  . TETANUS/TDAP  11/12/2021  . DEXA SCAN  Completed  Physical Exam: Vitals:   08/08/18 0749  BP: 120/80  Pulse: 68  Temp: 98.4 F (36.9 C)  TempSrc: Oral  SpO2: 96%  Weight: 145 lb (65.8 kg)  Height: 5\' 5"  (1.651 m)   Body mass index is 24.13 kg/m. Physical Exam  Constitutional: She is oriented to person, place, and time. She appears well-developed and well-nourished. No distress.  Cardiovascular: Normal rate, regular rhythm, normal heart sounds and intact distal pulses.  Pulmonary/Chest: Effort normal and breath sounds normal. No respiratory distress.  Abdominal: Bowel sounds are normal.    Musculoskeletal: Normal range of motion.  Neurological: She is alert and oriented to person, place, and time.  Skin: Skin is warm and dry.  Psychiatric: She has a normal mood and affect.    Labs reviewed: Basic Metabolic Panel: Recent Labs    01/21/18 1034 07/18/18 0843  NA 137 139  K 4.4 4.3  CL 103 105  CO2 29 29  GLUCOSE 119 125*  BUN 18 16  CREATININE 0.82 0.85  CALCIUM 10.0 9.5   Liver Function Tests: Recent Labs    07/18/18 0843  AST 24  ALT 12  BILITOT 0.4  PROT 7.4   No results for input(s): LIPASE, AMYLASE in the last 8760 hours. No results for input(s): AMMONIA in the last 8760 hours. CBC: Recent Labs    07/18/18 0843  WBC 5.4  NEUTROABS 2,290  HGB 13.6  HCT 39.9  MCV 89.3  PLT 131*   Lipid Panel: Recent Labs    07/18/18 0843  CHOL 196  HDL 53  LDLCALC 120*  TRIG 122  CHOLHDL 3.7   Lab Results  Component Value Date   HGBA1C 7.0 (H) 07/18/2018    Assessment/Plan 1. Essential hypertension, benign - bp at goal today with just lisinopril, will stay off chlorthalidone which she did not tolerate - Basic metabolic panel; Future  2. Type 2 diabetes mellitus with other specified complication, without long-term current use of insulin (HCC) - work on dietary changes and f/u labs - Hemoglobin A1c; Future - Basic metabolic panel; Future  3. Hyperlipidemia associated with type 2 diabetes mellitus (HCC) -cont to work on diet to lower cholesterol, refuses statin - Lipid panel; Future  Labs/tests ordered:   Orders Placed This Encounter  Procedures  . Hemoglobin A1c    Standing Status:   Future    Standing Expiration Date:   08/09/2019  . Lipid panel    Standing Status:   Future    Standing Expiration Date:   08/09/2019    Order Specific Question:   Has the patient fasted?    Answer:   Yes  . Basic metabolic panel    Standing Status:   Future    Standing Expiration Date:   08/09/2019    Order Specific Question:   Has the patient fasted?     Answer:   Yes    Next appt:  4 mos med mgt, fasting labs before  Christen Wardrop L. Emmett Arntz, D.O. Geriatrics [facility] [address] [place], [place] [id number] Cell Phone (Mon-Fri 8am-5pm):  [phone] On Call:  [phone] & follow prompts after 5pm & weekends Office Phone:  [phone] Office Fax:  [phone]

## 2018-11-17 ENCOUNTER — Other Ambulatory Visit: Payer: Self-pay | Admitting: Internal Medicine

## 2018-12-10 ENCOUNTER — Other Ambulatory Visit: Payer: Medicare Other

## 2018-12-12 ENCOUNTER — Encounter: Payer: Self-pay | Admitting: Internal Medicine

## 2018-12-12 ENCOUNTER — Other Ambulatory Visit: Payer: Self-pay

## 2018-12-12 ENCOUNTER — Ambulatory Visit (INDEPENDENT_AMBULATORY_CARE_PROVIDER_SITE_OTHER): Payer: Medicare Other | Admitting: Internal Medicine

## 2018-12-12 ENCOUNTER — Other Ambulatory Visit: Payer: Self-pay | Admitting: Internal Medicine

## 2018-12-12 DIAGNOSIS — K5790 Diverticulosis of intestine, part unspecified, without perforation or abscess without bleeding: Secondary | ICD-10-CM

## 2018-12-12 DIAGNOSIS — E1169 Type 2 diabetes mellitus with other specified complication: Secondary | ICD-10-CM | POA: Diagnosis not present

## 2018-12-12 DIAGNOSIS — E785 Hyperlipidemia, unspecified: Secondary | ICD-10-CM | POA: Diagnosis not present

## 2018-12-12 DIAGNOSIS — I10 Essential (primary) hypertension: Secondary | ICD-10-CM

## 2018-12-12 DIAGNOSIS — Z7189 Other specified counseling: Secondary | ICD-10-CM | POA: Diagnosis not present

## 2018-12-12 MED ORDER — GLUCOSE BLOOD VI STRP: ORAL_STRIP | 1 refills | Status: DC

## 2018-12-12 NOTE — Progress Notes (Signed)
Patient ID: Jocelyn MasseGeraldine Kealey, female   DOB: Jan 18, 1935, 84 y.o.   MRN: [id number]  This service is provided via telemedicine  No vital signs collected/recorded due to the encounter was a telemedicine visit.   Location of patient (ex: home, work):  HOME  Patient consents to a telephone visit:  YES  Location of the provider (ex: office, home):  OFFICE  Name of any referring provider:  DR. Bufford SpikesIFFANY Audry Kauzlarich, DO  Names of all persons participating in the telemedicine service and their role in the encounter:  Tangelia Iglehart, PATIENT, Mervin HackESHANNON SMITH, CMA, Dr. Renato Gailseed  Time spent on call:  3:59    Provider:  Mykelle Cockerell L. Renato Gailseed, D.O., C.M.D.  Goals of Care: see update in vynca Advanced Directives 03/13/2018  Does Patient Have a Medical Advance Directive? Yes  Type of Advance Directive Out of facility DNR (pink MOST or yellow form)  Does patient want to make changes to medical advance directive? Yes (MAU/Ambulatory/Procedural Areas - Information given)  Copy of Healthcare Power of Attorney in Chart? -  Would patient like information on creating a medical advance directive? -  Pre-existing out of facility DNR order (yellow form or pink MOST form) Pink MOST form placed in chart (order not valid for inpatient use);Physician notified to receive inpatient order     Chief Complaint  Patient presents with  . telephone visit    4mth follow-up    HPI: Patient is a 84 y.o. female seen today for medical management of chronic diseases.  She's doing ok.  Staying in.  Got herself a few masks so when she goes to her mail or trash.  Asks how long to use the same one.  Children deliver groceries to her patio.  Handwashing and showering regularly.  Has a lot of nurses in the family educating her.  DMII:  The sugars are doing better.  111 this morning.  120 if she eats popcorn.  Has been trying not to eat popcorn.  Thinks she has gained a pound.  She is exercising in her apt.  She is crocheting, playing games and did  a full cleaning up of the house.  It was quite a job.  Trying to stick with pecans.    No troubles with abdominal pain or diverticulitis flares.  She's drinking a lot of water.    We reviewed her goals of care.  See vynca.    She is due for cologuard.    Past Medical History:  Diagnosis Date  . Diabetes mellitus   . Hyperlipidemia   . Hypertension   . Myalgia and myositis, unspecified   . 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 Vitamin D deficiency     Past Surgical History:  Procedure Laterality Date  . 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 aspirin 81 MG tablet Take 81 mg by mouth daily.  . CONTOUR TEST test strip TEST SUGAR 1 TO 2 TIMES A DAY(DX 250.00)  . Lancets (ONETOUCH ULTRASOFT) lancets USE AS INSTRUCTED TO TEST BLOOD SUGAR TWICE DAILY. DX: E11.9  . lisinopril (PRINIVIL,ZESTRIL) 40 MG tablet TAKE ONE TABLET BY MOUTH ONCE DAILY FOR BLOOD PRESSURE  . metFORMIN (GLUCOPHAGE) 500 MG tablet Take 500 mg by mouth daily with breakfast.  . [DISCONTINUED] meclizine (ANTIVERT) 25 MG tablet Take 1 tablet (25 mg total) by mouth 3 (three) times daily as needed for  dizziness.  . [DISCONTINUED] metFORMIN (GLUCOPHAGE) 500 MG tablet Take 1 tablet (500 mg total) by mouth daily with breakfast. Take 2 tablets by mouth with evening meal   No facility-administered encounter medications on file as of 12/12/2018.     Review of Systems:  Review of Systems  Constitutional: Negative for chills, fever and malaise/fatigue.  HENT: Negative for hearing loss.   Eyes: Negative for blurred vision.  Respiratory: Negative for cough and shortness of breath.   Cardiovascular: Negative for chest pain, palpitations and leg swelling.  Gastrointestinal: Negative for abdominal pain, diarrhea, nausea and vomiting.  Genitourinary: Negative for dysuria.  Musculoskeletal:  Negative for falls and joint pain.  Neurological: Negative for dizziness and loss of consciousness.  Endo/Heme/Allergies: Does not bruise/bleed easily.  Psychiatric/Behavioral: Negative for depression and memory loss. The patient is not nervous/anxious and does not have insomnia.     Health Maintenance  Topic Date Due  . HEMOGLOBIN A1C  01/16/2019  . FOOT EXAM  01/22/2019  . OPHTHALMOLOGY EXAM  04/12/2019  . TETANUS/TDAP  11/12/2021  . DEXA SCAN  Completed    Physical Exam: Could not be performed as visit non face-to-face via phone   Labs reviewed: Basic Metabolic Panel: Recent Labs    01/21/18 1034 07/18/18 0843  NA 137 139  K 4.4 4.3  CL 103 105  CO2 29 29  GLUCOSE 119 125*  BUN 18 16  CREATININE 0.82 0.85  CALCIUM 10.0 9.5   Liver Function Tests: Recent Labs    07/18/18 0843  AST 24  ALT 12  BILITOT 0.4  PROT 7.4   No results for input(s): LIPASE, AMYLASE in the last 8760 hours. No results for input(s): AMMONIA in the last 8760 hours. CBC: Recent Labs    07/18/18 0843  WBC 5.4  NEUTROABS 2,290  HGB 13.6  HCT 39.9  MCV 89.3  PLT 131*   Lipid Panel: Recent Labs    07/18/18 0843  CHOL 196  HDL 53  LDLCALC 120*  TRIG 122  CHOLHDL 3.7   Lab Results  Component Value Date   HGBA1C 7.0 (H) 07/18/2018     Assessment/Plan 1. Type 2 diabetes mellitus with other specified complication, without long-term current use of insulin (HCC) -sugars controlled with cbgs at home -pt to continue current regimen, attempting healthy diet and exercising as best she can at home  2. Essential hypertension, benign -cont current regimen, no signs of high bps or low bps  3. Hyperlipidemia associated with type 2 diabetes mellitus (HCC) -lipids have been fairly good, she does not want to take statins  4. Diverticulosis -no recent diverticulitis episodes, fever or abdominal pain  5. ACP (advance care planning) -goals of care filled in in vynca--pt does request  intubation and mechanical ventilation if she needs it if she should acquire covid-19 UNLESS she has a cardiac arrest or is braindead  Labs/tests ordered:   Orders Placed This Encounter  Procedures  . CBC with Differential/Platelet    Standing Status:   Future    Standing Expiration Date:   12/12/2019  . COMPLETE METABOLIC PANEL WITH GFR    Standing Status:   Future    Standing Expiration Date:   12/12/2019  . Lipid panel    Standing Status:   Future    Standing Expiration Date:   12/12/2019    Order Specific Question:   Has the patient fasted?    Answer:   Yes  . Hemoglobin A1c    Standing Status:  Future    Standing Expiration Date:   12/12/2019    Next appt:  12/12/2018 Non face-to-face time spent on televisit:  28 minutes with 18 minutes on ACP, 10 minutes on med mgt by phone  Yonael Tulloch L. Sachiko Methot, D.O. Geriatrics [facility] [address], [place] [address] Cell Phone (Mon-Fri 8am-5pm):  [phone] On Call:  [phone] & follow prompts after 5pm & weekends Office Phone:  [phone] Office Fax:  [phone]

## 2018-12-18 DIAGNOSIS — Z1211 Encounter for screening for malignant neoplasm of colon: Secondary | ICD-10-CM | POA: Diagnosis not present

## 2018-12-18 DIAGNOSIS — Z1212 Encounter for screening for malignant neoplasm of rectum: Secondary | ICD-10-CM | POA: Diagnosis not present

## 2018-12-22 LAB — COLOGUARD: Cologuard: NEGATIVE

## 2018-12-23 ENCOUNTER — Other Ambulatory Visit: Payer: Self-pay | Admitting: Internal Medicine

## 2018-12-25 ENCOUNTER — Encounter: Payer: Self-pay | Admitting: *Deleted

## 2018-12-31 ENCOUNTER — Other Ambulatory Visit: Payer: Medicare Other

## 2019-01-17 ENCOUNTER — Other Ambulatory Visit: Payer: Self-pay | Admitting: Internal Medicine

## 2019-02-27 ENCOUNTER — Telehealth: Payer: Self-pay

## 2019-02-27 MED ORDER — METFORMIN HCL 500 MG PO TABS: ORAL_TABLET | ORAL | 1 refills | Status: DC

## 2019-02-27 NOTE — Telephone Encounter (Signed)
Patient called for medication refill of Metformin

## 2019-03-17 ENCOUNTER — Ambulatory Visit: Payer: Self-pay

## 2019-03-17 ENCOUNTER — Encounter: Payer: Medicare Other | Admitting: Family

## 2019-03-18 ENCOUNTER — Ambulatory Visit (INDEPENDENT_AMBULATORY_CARE_PROVIDER_SITE_OTHER): Payer: Medicare Other | Admitting: Family

## 2019-03-18 ENCOUNTER — Other Ambulatory Visit: Payer: Self-pay

## 2019-03-18 ENCOUNTER — Encounter: Payer: Self-pay | Admitting: Family

## 2019-03-18 VITALS — BP 118/80 | HR 80 | Temp 98.3°F | Ht 65.0 in | Wt 152.6 lb

## 2019-03-18 DIAGNOSIS — Z0000 Encounter for general adult medical examination without abnormal findings: Secondary | ICD-10-CM | POA: Diagnosis not present

## 2019-03-18 DIAGNOSIS — E1169 Type 2 diabetes mellitus with other specified complication: Secondary | ICD-10-CM | POA: Diagnosis not present

## 2019-03-18 MED ORDER — CVS BLOOD GLUCOSE METER W/DEVICE KIT: PACK | 0 refills | Status: DC

## 2019-03-18 MED ORDER — ACCU-CHEK SOFT TOUCH LANCETS MISC: 1.0000 | 3 refills | Status: DC | PRN

## 2019-03-18 MED ORDER — CVS GLUCOSE METER TEST STRIPS VI STRP: 1.0000 | ORAL_STRIP | 3 refills | Status: DC | PRN

## 2019-03-18 NOTE — Addendum Note (Signed)
Addended by: Ruthell Rummage A on: 03/18/2019 11:29 AM   Modules accepted: Orders

## 2019-03-18 NOTE — Patient Instructions (Signed)
Jocelyn Hendrix , Thank you for taking time to come for your Medicare Wellness Visit. I appreciate your ongoing commitment to your health goals. Please review the following plan we discussed and let me know if I can assist you in the future.   Screening recommendations/referrals: Colonoscopy : Up to dated  Mammogram: No applicable  Bone Density: Up to date  Recommended yearly ophthalmology/optometry visit for glaucoma screening and checkup Recommended yearly dental visit for hygiene and checkup  Vaccinations: Influenza vaccine : N/A  Pneumococcal vaccine : N/A  Tdap vaccine: due 2023  Shingles vaccine: N/a     Advanced directives: Yes   Conditions/risks identified: Advance age female > 58 yrs,type 2DM,Hyperlipidemia and hypertension.  Next appointment: 1 year    Preventive Care 68 Years and Older, Female Preventive care refers to lifestyle choices and visits with your health care provider that can promote health and wellness. What does preventive care include?  A yearly physical exam. This is also called an annual well check.  Dental exams once or twice a year.  Routine eye exams. Ask your health care provider how often you should have your eyes checked.  Personal lifestyle choices, including:  Daily care of your teeth and gums.  Regular physical activity.  Eating a healthy diet.  Avoiding tobacco and drug use.  Limiting alcohol use.  Practicing safe sex.  Taking low-dose aspirin every day.  Taking vitamin and mineral supplements as recommended by your health care provider. What happens during an annual well check? The services and screenings done by your health care provider during your annual well check will depend on your age, overall health, lifestyle risk factors, and family history of disease. Counseling  Your health care provider may ask you questions about your:  Alcohol use.  Tobacco use.  Drug use.  Emotional well-being.  Home and relationship  well-being.  Sexual activity.  Eating habits.  History of falls.  Memory and ability to understand (cognition).  Work and work Statistician.  Reproductive health. Screening  You may have the following tests or measurements:  Height, weight, and BMI.  Blood pressure.  Lipid and cholesterol levels. These may be checked every 5 years, or more frequently if you are over 87 years old.  Skin check.  Lung cancer screening. You may have this screening every year starting at age 45 if you have a 30-pack-year history of smoking and currently smoke or have quit within the past 15 years.  Fecal occult blood test (FOBT) of the stool. You may have this test every year starting at age 44.  Flexible sigmoidoscopy or colonoscopy. You may have a sigmoidoscopy every 5 years or a colonoscopy every 10 years starting at age 27.  Hepatitis C blood test.  Hepatitis B blood test.  Sexually transmitted disease (STD) testing.  Diabetes screening. This is done by checking your blood sugar (glucose) after you have not eaten for a while (fasting). You may have this done every 1-3 years.  Bone density scan. This is done to screen for osteoporosis. You may have this done starting at age 17.  Mammogram. This may be done every 1-2 years. Talk to your health care provider about how often you should have regular mammograms. Talk with your health care provider about your test results, treatment options, and if necessary, the need for more tests. Vaccines  Your health care provider may recommend certain vaccines, such as:  Influenza vaccine. This is recommended every year.  Tetanus, diphtheria, and acellular pertussis (Tdap, Td)  vaccine. You may need a Td booster every 10 years.  Zoster vaccine. You may need this after age 31.  Pneumococcal 13-valent conjugate (PCV13) vaccine. One dose is recommended after age 59.  Pneumococcal polysaccharide (PPSV23) vaccine. One dose is recommended after age 49.  Talk to your health care provider about which screenings and vaccines you need and how often you need them. This information is not intended to replace advice given to you by your health care provider. Make sure you discuss any questions you have with your health care provider. Document Released: 09/17/2015 Document Revised: 05/10/2016 Document Reviewed: 06/22/2015 Elsevier Interactive Patient Education  2017 [employer] Prevention in the Home Falls can cause injuries. They can happen to people of all ages. There are many things you can do to make your home safe and to help prevent falls. What can I do on the outside of my home?  Regularly fix the edges of walkways and driveways and fix any cracks.  Remove anything that might make you trip as you walk through a door, such as a raised step or threshold.  Trim any bushes or trees on the path to your home.  Use bright outdoor lighting.  Clear any walking paths of anything that might make someone trip, such as rocks or tools.  Regularly check to see if handrails are loose or broken. Make sure that both sides of any steps have handrails.  Any raised decks and porches should have guardrails on the edges.  Have any leaves, snow, or ice cleared regularly.  Use sand or salt on walking paths during winter.  Clean up any spills in your garage right away. This includes oil or grease spills. What can I do in the bathroom?  Use night lights.  Install grab bars by the toilet and in the tub and shower. Do not use towel bars as grab bars.  Use non-skid mats or decals in the tub or shower.  If you need to sit down in the shower, use a plastic, non-slip stool.  Keep the floor dry. Clean up any water that spills on the floor as soon as it happens.  Remove soap buildup in the tub or shower regularly.  Attach bath mats securely with double-sided non-slip rug tape.  Do not have throw rugs and other things on the floor that can make you  trip. What can I do in the bedroom?  Use night lights.  Make sure that you have a light by your bed that is easy to reach.  Do not use any sheets or blankets that are too big for your bed. They should not hang down onto the floor.  Have a firm chair that has side arms. You can use this for support while you get dressed.  Do not have throw rugs and other things on the floor that can make you trip. What can I do in the kitchen?  Clean up any spills right away.  Avoid walking on wet floors.  Keep items that you use a lot in easy-to-reach places.  If you need to reach something above you, use a strong step stool that has a grab bar.  Keep electrical cords out of the way.  Do not use floor polish or wax that makes floors slippery. If you must use wax, use non-skid floor wax.  Do not have throw rugs and other things on the floor that can make you trip. What can I do with my stairs?  Do not leave  any items on the stairs.  Make sure that there are handrails on both sides of the stairs and use them. Fix handrails that are broken or loose. Make sure that handrails are as long as the stairways.  Check any carpeting to make sure that it is firmly attached to the stairs. Fix any carpet that is loose or worn.  Avoid having throw rugs at the top or bottom of the stairs. If you do have throw rugs, attach them to the floor with carpet tape.  Make sure that you have a light switch at the top of the stairs and the bottom of the stairs. If you do not have them, ask someone to add them for you. What else can I do to help prevent falls?  Wear shoes that:  Do not have high heels.  Have rubber bottoms.  Are comfortable and fit you well.  Are closed at the toe. Do not wear sandals.  If you use a stepladder:  Make sure that it is fully opened. Do not climb a closed stepladder.  Make sure that both sides of the stepladder are locked into place.  Ask someone to hold it for you, if  possible.  Clearly mark and make sure that you can see:  Any grab bars or handrails.  First and last steps.  Where the edge of each step is.  Use tools that help you move around (mobility aids) if they are needed. These include:  Canes.  Walkers.  Scooters.  Crutches.  Turn on the lights when you go into a dark area. Replace any light bulbs as soon as they burn out.  Set up your furniture so you have a clear path. Avoid moving your furniture around.  If any of your floors are uneven, fix them.  If there are any pets around you, be aware of where they are.  Review your medicines with your doctor. Some medicines can make you feel dizzy. This can increase your chance of falling. Ask your doctor what other things that you can do to help prevent falls. This information is not intended to replace advice given to you by your health care provider. Make sure you discuss any questions you have with your health care provider. Document Released: 06/17/2009 Document Revised: 01/27/2016 Document Reviewed: 09/25/2014 Elsevier Interactive Patient Education  2017 [employer].

## 2019-03-18 NOTE — Progress Notes (Signed)
Subjective:   Jocelyn Hendrix is a 84 y.o. female who presents for Medicare Annual (Subsequent) preventive examination.  Review of Systems:   Cardiac Risk Factors include: advanced age (>5855men, 65>65 women);diabetes mellitus;dyslipidemia;hypertension     Objective:     Vitals: BP 118/80   Pulse 80   Temp 98.3 F (36.8 C) (Oral)   Ht 5\' 5"  (1.651 m)   Wt 152 lb 9.6 oz (69.2 kg)   SpO2 98%   BMI 25.39 kg/m   Body mass index is 25.39 kg/m.  Advanced Directives 03/18/2019 03/13/2018 01/21/2018 06/21/2017 06/16/2017 01/18/2017 01/17/2017  Does Patient Have a Medical Advance Directive? Yes Yes Yes Yes No Yes Yes  Type of Advance Directive Healthcare Power of Attorney Out of facility DNR (pink MOST or yellow form) Out of facility DNR (pink MOST or yellow form) Out of facility DNR (pink MOST or yellow form) - Out of facility DNR (pink MOST or yellow form) [place] Power of [place];Living will  Does patient want to make changes to medical advance directive? No - Patient declined Yes (MAU/Ambulatory/Procedural Areas - Information given) No - Patient declined - - - No - Patient declined  Copy of Healthcare Power of Attorney in Chart? No - copy requested - - - - - Yes  Would patient like information on creating a medical advance directive? - - - - - - No - Patient declined  Pre-existing out of facility DNR order (yellow form or pink MOST form) - Pink MOST form placed in chart (order not valid for inpatient use);Physician notified to receive inpatient order Pink MOST form placed in chart (order not valid for inpatient use) Pink MOST form placed in chart (order not valid for inpatient use) - Pink MOST form placed in chart (order not valid for inpatient use) -    Tobacco Social History   Tobacco Use  Smoking Status Never Smoker  Smokeless Tobacco Never Used     Counseling given: Not Answered   Clinical Intake:  Pre-visit preparation completed: No  Pain : No/denies pain     BMI -  recorded: 25.39 Nutritional Status: BMI 25 -29 Overweight Nutritional Risks: None Diabetes: Yes CBG done?: No Did pt. bring in CBG monitor from home?: Yes Glucose Meter Downloaded?: No(CBG 110's-140's)  How often do you need to have someone help you when you read instructions, pamphlets, or other written materials from your doctor or pharmacy?: 1 - Never What is the last grade level you completed in school?: 12 grade  Interpreter Needed?: No  Information entered by :: Carlea Badour FNP-C  Past Medical History:  Diagnosis Date  . Diabetes mellitus   . Hyperlipidemia   . Hypertension   . Myalgia and myositis, unspecified   . 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 Vitamin D deficiency    Past Surgical History:  Procedure Laterality Date  . 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 Heart failure Mother   . Diabetes Mother   . Alzheimer's disease Sister    Social History   Socioeconomic History  . Marital status: Widowed    Spouse name: Not on file  . Number of children: Not on file  . Years of education: Not on file  . Highest education level: Not on file  Occupational History  . Not on file  Social Needs  . Financial resource strain: Not hard at all  . Food  insecurity    Worry: Never true    Inability: Never true  . Transportation needs    Medical: No    Non-medical: No  Tobacco Use  . Smoking status: Never Smoker  . Smokeless tobacco: Never Used  Substance and Sexual Activity  . Alcohol use: No    Alcohol/week: 0.0 standard drinks  . Drug use: No  . Sexual activity: Not Currently    Birth control/protection: Post-menopausal  Lifestyle  . Physical activity    Days per week: 7 days    Minutes per session: 20 min  . Stress: Only a little  Relationships  . Social connections    Talks on phone: More than three times a week    Gets together: More than three  times a week    Attends religious service: More than 4 times per year    Active member of club or organization: No    Attends meetings of clubs or organizations: Never    Relationship status: Widowed  Other Topics Concern  . Not on file  Social History Narrative  . Not on file    Outpatient Encounter Medications as of 03/18/2019  Medication Sig  . aspirin 81 MG tablet Take 81 mg by mouth daily.  Marland Kitchen glucose blood (CONTOUR TEST) test strip TEST SUGAR 1 TO 2 TIMES A DAY(DX 250.00)  . Lancets (ONETOUCH ULTRASOFT) lancets USE AS INSTRUCTED TO TEST BLOOD SUGAR TWICE DAILY. DX: E11.9  . lisinopril (PRINIVIL,ZESTRIL) 40 MG tablet TAKE ONE TABLET BY MOUTH ONCE DAILY FOR BLOOD PRESSURE  . metFORMIN (GLUCOPHAGE) 500 MG tablet TAKE 1 TABLET BY MOUTH DAILY WITH BREAKFAST. TAKE 2 TABLETS BY MOUTH WITH EVENING MEAL   No facility-administered encounter medications on file as of 03/18/2019.     Activities of Daily Living In your present state of health, do you have any difficulty performing the following activities: 03/18/2019  Hearing? N  Vision? N  Difficulty concentrating or making decisions? Y  Comment rembering where " I put something"  Walking or climbing stairs? N  Dressing or bathing? N  Doing errands, shopping? Y  Comment grand children assist with shopping.Does not drive anymore  Preparing Food and eating ? N  Using the Toilet? N  In the past six months, have you accidently leaked urine? N  Do you have problems with loss of bowel control? N  Managing your Medications? N  Managing your Finances? Y  Comment daughter Pharmacist, hospital? N  Some recent data might be hidden    Patient Care Team: Gayland Curry, DO as PCP - General (Geriatric Medicine) Clent Jacks, MD as Consulting Physician (Ophthalmology)    Assessment:   This is a routine wellness examination for Jocelyn Hendrix.  Exercise Activities and Dietary recommendations Current  Exercise Habits: Home exercise routine, Type of exercise: walking, Time (Minutes): 15, Frequency (Times/Week): 4, Weekly Exercise (Minutes/Week): 60, Intensity: Mild, Exercise limited by: None identified  Goals    . Maintain Lifestyle     Starting today pt will maintain lifestyle.        Fall Risk Fall Risk  03/18/2019 12/12/2018 08/08/2018 07/25/2018 03/13/2018  Falls in the past year? 0 0 0 0 Yes  Number falls in past yr: 0 0 0 0 1  Comment - - - - vertigo  Injury with Fall? 0 0 0 0 No   Is the patient's home free of loose throw rugs in walkways, pet beds, electrical cords, etc?   no  Grab bars in the bathroom? yes      Handrails on the stairs?   no no stairs in the senior apartments.       Adequate lighting?   yes  Depression Screen PHQ 2/9 Scores 03/18/2019 12/12/2018 08/08/2018 07/25/2018  PHQ - 2 Score 0 0 0 0     Cognitive Function MMSE - Mini Mental State Exam 03/18/2019 03/13/2018 01/17/2017 01/10/2016 08/07/2014  Orientation to time 5 5 4 5 5   Orientation to Place 5 5 5 5 5   Registration 3 3 3 3 3   Attention/ Calculation 5 5 4 5 5   Recall 2 1 0 3 2  Language- name 2 objects 2 2 2 2 2   Language- repeat 1 1 1 1 1   Language- follow 3 step command 3 3 3 3 3   Language- read & follow direction 1 1 1 1 1   Write a sentence 1 1 1 1 1   Copy design 1 1 0 1 1  Total score 29 28 24 30 29         Immunization History  Administered Date(s) Administered  . Tdap 11/13/2011    Qualifies for Shingles Vaccine? Decline   Screening Tests Health Maintenance  Topic Date Due  . HEMOGLOBIN A1C  01/16/2019  . FOOT EXAM  01/22/2019  . OPHTHALMOLOGY EXAM  04/12/2019  . TETANUS/TDAP  11/12/2021  . DEXA SCAN  Completed    Cancer Screenings: Lung: Low Dose CT Chest recommended if Age 57-80 years, 30 pack-year currently smoking OR have quit w/in 15years. Patient does not qualify. Breast:  Up to date on Mammogram? No.N/a  Up to date of Bone Density/Dexa? Yes Colorectal: Up to date    Additional Screenings: Hepatitis C Screening: Low risk      Plan:   - Refer to podiatrist for diabetic foot exam   I have personally reviewed and noted the following in the patient's chart:   . Medical and social history . Use of alcohol, tobacco or illicit drugs  . Current medications and supplements . Functional ability and status . Nutritional status . Physical activity . Advanced directives . List of other physicians . Hospitalizations, surgeries, and ER visits in previous 12 months . Vitals . Screenings to include cognitive, depression, and falls . Referrals and appointments  In addition, I have reviewed and discussed with patient certain preventive protocols, quality metrics, and best practice recommendations. A written personalized care plan for preventive services as well as general preventive health recommendations were provided to patient.    Caesar Bookmaninah C Steffany Schoenfelder, NP  03/18/2019

## 2019-03-29 ENCOUNTER — Other Ambulatory Visit: Payer: Self-pay | Admitting: Internal Medicine

## 2019-04-08 ENCOUNTER — Other Ambulatory Visit: Payer: Self-pay | Admitting: Internal Medicine

## 2019-04-08 MED ORDER — METFORMIN HCL 500 MG PO TABS: 500.0000 mg | ORAL_TABLET | Freq: Three times a day (TID) | ORAL | 1 refills | Status: DC

## 2019-04-09 ENCOUNTER — Other Ambulatory Visit: Payer: Medicare Other

## 2019-04-09 ENCOUNTER — Other Ambulatory Visit: Payer: Self-pay

## 2019-04-09 DIAGNOSIS — E785 Hyperlipidemia, unspecified: Secondary | ICD-10-CM | POA: Diagnosis not present

## 2019-04-09 DIAGNOSIS — I10 Essential (primary) hypertension: Secondary | ICD-10-CM | POA: Diagnosis not present

## 2019-04-09 DIAGNOSIS — K5790 Diverticulosis of intestine, part unspecified, without perforation or abscess without bleeding: Secondary | ICD-10-CM

## 2019-04-09 DIAGNOSIS — E1169 Type 2 diabetes mellitus with other specified complication: Secondary | ICD-10-CM

## 2019-04-10 LAB — COMPLETE METABOLIC PANEL WITH GFR
AG Ratio: 1.1 (calc) (ref 1.0–2.5)
ALT: 15 U/L (ref 6–29)
AST: 26 U/L (ref 10–35)
Albumin: 3.9 g/dL (ref 3.6–5.1)
Alkaline phosphatase (APISO): 64 U/L (ref 37–153)
BUN: 18 mg/dL (ref 7–25)
CO2: 29 mmol/L (ref 20–32)
Calcium: 9.9 mg/dL (ref 8.6–10.4)
Chloride: 104 mmol/L (ref 98–110)
Creat: 0.82 mg/dL (ref 0.60–0.88)
GFR, Est African American: 78 mL/min/{1.73_m2} (ref >=60)
GFR, Est Non African American: 67 mL/min/{1.73_m2} (ref >=60)
Globulin: 3.6 g/dL (calc) (ref 1.9–3.7)
Glucose, Bld: 134 mg/dL — ABNORMAL HIGH (ref 65–99)
Potassium: 4.5 mmol/L (ref 3.5–5.3)
Sodium: 138 mmol/L (ref 135–146)
Total Bilirubin: 0.3 mg/dL (ref 0.2–1.2)
Total Protein: 7.5 g/dL (ref 6.1–8.1)

## 2019-04-10 LAB — CBC WITH DIFFERENTIAL/PLATELET
Absolute Monocytes: 548 cells/uL (ref 200–950)
Basophils Absolute: 79 cells/uL (ref 0–200)
Basophils Relative: 1.2 %
Eosinophils Absolute: 251 cells/uL (ref 15–500)
Eosinophils Relative: 3.8 %
HCT: 39.8 % (ref 35.0–45.0)
Hemoglobin: 13.1 g/dL (ref 11.7–15.5)
Lymphs Abs: 2765 cells/uL (ref 850–3900)
MCH: 29.4 pg (ref 27.0–33.0)
MCHC: 32.9 g/dL (ref 32.0–36.0)
MCV: 89.4 fL (ref 80.0–100.0)
MPV: 14.3 fL — ABNORMAL HIGH (ref 7.5–12.5)
Monocytes Relative: 8.3 %
Neutro Abs: 2957 cells/uL (ref 1500–7800)
Neutrophils Relative %: 44.8 %
Platelets: 133 10*3/uL — ABNORMAL LOW (ref 140–400)
RBC: 4.45 10*6/uL (ref 3.80–5.10)
RDW: 13.1 % (ref 11.0–15.0)
Total Lymphocyte: 41.9 %
WBC: 6.6 10*3/uL (ref 3.8–10.8)

## 2019-04-10 LAB — HEMOGLOBIN A1C
Hgb A1c MFr Bld: 6.9 % of total Hgb — ABNORMAL HIGH (ref <5.7)
Mean Plasma Glucose: 151 (calc)
eAG (mmol/L): 8.4 (calc)

## 2019-04-10 LAB — LIPID PANEL
Cholesterol: 212 mg/dL — ABNORMAL HIGH (ref <200)
HDL: 56 mg/dL (ref >=50)
LDL Cholesterol (Calc): 131 mg/dL (calc) — ABNORMAL HIGH
Non-HDL Cholesterol (Calc): 156 mg/dL (calc) — ABNORMAL HIGH (ref <130)
Total CHOL/HDL Ratio: 3.8 (calc) (ref <5.0)
Triglycerides: 137 mg/dL (ref <150)

## 2019-04-14 ENCOUNTER — Encounter: Payer: Self-pay | Admitting: Internal Medicine

## 2019-04-14 ENCOUNTER — Other Ambulatory Visit: Payer: Self-pay

## 2019-04-14 ENCOUNTER — Ambulatory Visit (INDEPENDENT_AMBULATORY_CARE_PROVIDER_SITE_OTHER): Payer: Medicare Other | Admitting: Internal Medicine

## 2019-04-14 VITALS — BP 148/86 | HR 67 | Temp 98.1°F | Ht 65.0 in | Wt 153.0 lb

## 2019-04-14 DIAGNOSIS — E785 Hyperlipidemia, unspecified: Secondary | ICD-10-CM | POA: Diagnosis not present

## 2019-04-14 DIAGNOSIS — E1169 Type 2 diabetes mellitus with other specified complication: Secondary | ICD-10-CM

## 2019-04-14 DIAGNOSIS — I10 Essential (primary) hypertension: Secondary | ICD-10-CM | POA: Diagnosis not present

## 2019-04-14 DIAGNOSIS — E663 Overweight: Secondary | ICD-10-CM | POA: Diagnosis not present

## 2019-04-14 DIAGNOSIS — Z8669 Personal history of other diseases of the nervous system and sense organs: Secondary | ICD-10-CM | POA: Diagnosis not present

## 2019-04-14 NOTE — Progress Notes (Signed)
Location:  [facility] clinic Provider:  Timiko Offutt L. Mariea Clonts, D.O., C.M.D.  Goals of Care:  Advanced Directives 03/18/2019  Does Patient Have a Medical Advance Directive? Yes  Type of Advance Directive [place]  Does patient want to make changes to medical advance directive? No - Patient declined  Copy of [place] in Chart? No - copy requested  Would patient like information on creating a medical advance directive? -  Pre-existing out of facility DNR order (yellow form or pink MOST form) -     Chief Complaint  Patient presents with  . Medical Management of Chronic Issues    Jocelyn Hendrix follow-up    HPI: Patient is a 84y.o. female seen today for medical management of chronic diseases.    She's keeping busy.  She is doing gardening.  She stays busy with that.  She's also been going through old paperwork.  Texting with family in [place]    She wants to stay independent unless she gets ill.    She thought she broke her right thumb.  It clicks a lot.  She has had to wash her hands more.  It does not really hurt her.  She has quit crocheting.    Reviewed labs with her.  Sugar average improved slightly.  Cholesterol has gone up.  She was eating mac and cheese for a period of time and doing a little cheese on sandwiches.  Admits she was out of control eating everything.  Not walking like she used to.  There are new tenants and the dog comes out when she goes by so she doesn't go that way.  Only walks in her apt anymore.  Weight is unchanged. Doesn't want to get up at the crack of dawn to avoid the dog.    She does not use salt, but may be overeating foods with preservatives.  Was also eating too large of portions.  Says she cannot afford a new wardrobe.    Past Medical History:  Diagnosis Date  . Diabetes mellitus   . Hyperlipidemia   . Hypertension   . Myalgia and myositis, unspecified   . 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 Vitamin D deficiency       Past Surgical History:  Procedure Laterality Date  . TBascomplaced for Guillain-Barre due to poisoning    No Known Allergies  Outpatient Encounter Medications as of 04/14/2019  Medication Sig  . aspirin 81 MG tablet Take 81 mg by mouth daily.  .Marland Kitchenglucose blood ([employer] GLUCOSE METER TEST STRIPS) test strip 1 each by Other route as needed for other. Use as instructed check 1 to 2 times daily   (DX 250.00)  . Lancets (ACCU-CHEK SOFT TOUCH) lancets 1 each by Other route as needed for other. Use as instructed  DX E11.9  . lisinopril (PRINIVIL,ZESTRIL) 40 MG tablet TAKE ONE TABLET BY MOUTH ONCE DAILY FOR BLOOD PRESSURE  . metFORMIN (GLUCOPHAGE) 500 MG tablet Take 1 tablet (500 mg total) by mouth 3 (three) times daily.  . [DISCONTINUED] Blood Glucose Monitoring Suppl ([employer] BLOOD GLUCOSE METER) w/Device KIT Use as directed DX 250.00 E11.9   No facility-administered encounter medications on file as of 04/14/2019.     Review of Systems:  Review of Systems  Constitutional: Negative for chills, fever and malaise/fatigue.  HENT: Positive for hearing loss. Negative for congestion and sore throat.   Eyes: Negative for blurred vision.  Glasses  Respiratory: Negative for cough and shortness of breath.   Cardiovascular: Negative for chest pain, palpitations and leg swelling.  Gastrointestinal: Negative for abdominal pain.  Genitourinary: Negative for dysuria.  Musculoskeletal: Positive for joint pain.       Right thumb  Skin: Negative for itching and rash.  Neurological: Negative for dizziness and loss of consciousness.  Endo/Heme/Allergies: Does not bruise/bleed easily.  Psychiatric/Behavioral: Negative for depression and memory loss. The patient is not nervous/anxious and does not have insomnia.     Health Maintenance  Topic Date Due  . FOOT EXAM  01/22/2019  . OPHTHALMOLOGY EXAM  04/12/2019  . HEMOGLOBIN A1C  10/10/2019  . TETANUS/TDAP  11/12/2021  . DEXA SCAN   Completed    Physical Exam: Vitals:   04/14/19 1108  BP: (!) 148/80  Pulse: 67  Temp: 98.1 F (36.7 C)  TempSrc: Oral  SpO2: 98%  Weight: 153 lb (69.4 kg)  Height: _0  (1.651 m)   Body mass index is 25.46 kg/m. Physical Exam Vitals signs reviewed.  Constitutional:      General: She is not in acute distress.    Appearance: Normal appearance. She is not toxic-appearing.  HENT:     Head: Normocephalic and atraumatic.  Cardiovascular:     Rate and Rhythm: Normal rate and regular rhythm.     Pulses: Normal pulses.     Heart sounds: Normal heart sounds.  Pulmonary:     Effort: Pulmonary effort is normal.     Breath sounds: Normal breath sounds. No wheezing, rhonchi or rales.  Abdominal:     General: Bowel sounds are normal.  Musculoskeletal: Normal range of motion.     Comments: Decreased ROM of right thumb; CMC tenderness, no effusion at present  Skin:    General: Skin is warm and dry.     Capillary Refill: Capillary refill takes less than 2 seconds.  Neurological:     General: No focal deficit present.     Mental Status: She is alert and oriented to person, place, and time. Mental status is at baseline.     Cranial Nerves: No cranial nerve deficit.     Motor: No weakness.     Gait: Gait normal.  Psychiatric:        Mood and Affect: Mood normal.     Labs reviewed: Basic Metabolic Panel: Recent Labs    07/18/18 0843 04/09/19 0821  NA 139 138  K 4.3 4.5  CL 105 104  CO2 29 29  GLUCOSE 125* 134*  BUN 16 18  CREATININE 0.85 0.82  CALCIUM 9.5 9.9   Liver Function Tests: Recent Labs    07/18/18 0843 04/09/19 0821  AST 24 26  ALT 12 15  BILITOT 0.4 0.3  PROT 7.4 7.5   No results for input(s): LIPASE, AMYLASE in the last 8760 hours. No results for input(s): AMMONIA in the last 8760 hours. CBC: Recent Labs    07/18/18 0843 04/09/19 0821  WBC 5.4 6.6  NEUTROABS 2,290 2,957  HGB 13.6 13.1  HCT 39.9 39.8  MCV 89.3 89.4  PLT 131* 133*   Lipid  Panel: Recent Labs    07/18/18 0843 04/09/19 0821  CHOL 196 212*  HDL 53 56  LDLCALC 120* 131*  TRIG 122 137  CHOLHDL 3.7 3.8   Lab Results  Component Value Date   HGBA1C 6.9 (H) 04/09/2019    Assessment/Plan 1. Overweight with body mass index (BMI) 25.0-29.9 -diet has been poor past several months  and she is now aware of stable overweight weight and increased cholesterol values -she will improve her diet and try to be more active based on counseling she was given today - Lipid panel; Future - Hemoglobin A1c; Future  2. Type 2 diabetes mellitus with other specified complication, without long-term current use of insulin (HCC) -hba1c trended down - I know she can bring this lower if she gets back to walking and eating healthier foods--given information on this and DASH diet due to increasing bp - BASIC METABOLIC PANEL WITH GFR; Future - Lipid panel; Future - Hemoglobin A1c; Future -  3. Essential hypertension, benign -not controlled today and has been borderline a few visits--she tried to blame anxiety/white coat, but admitted to eating more prepared foods the past few months and not walking--says she'll get back on track - [place] GFR; Future  4. Hyperlipidemia associated with type 2 diabetes mellitus (HCC) - LDL above goal of less than 70 and trending up -counseled on improved diet and we determined some foods that were likely contributing to this - Lipid panel; Future  5. History of Guillain-Barre syndrome -refuses any vaccines besides those she's already had due to this happening with flu shot in the past when she lived in [place]--had to be in ICU with tracheostomy   Labs/tests ordered:   Lab Orders     BASIC METABOLIC PANEL WITH GFR     Lipid panel     Hemoglobin A1c     Hemoglobin A1c  Next appt:  08/18/2019 med mgt fasting labs before  Aarnav Steagall L. Herley Bernardini, D.O. [facility] [address],  [place] [address] Cell Phone (Mon-Fri 8am-5pm):  [phone] On Call:  [phone] & follow prompts after 5pm & weekends Office Phone:  [phone] Office Fax:  [phone]

## 2019-04-14 NOTE — Patient Instructions (Addendum)
Your blood pressure has been elevated here lately.  Please pay attention to the sodium content in any prepared foods you are eating.  That is probably the reason for the elevation.  Also, try to get back to walking more to lose weight and bring down your sugar, cholesterol and blood pressure.    Fat and Cholesterol Restricted Eating Plan Getting too much fat and cholesterol in your diet may cause health problems. Choosing the right foods helps keep your fat and cholesterol at normal levels. This can keep you from getting certain diseases. What are tips for following this plan? Meal planning  At meals, divide your plate into four equal parts: ? Fill one-half of your plate with vegetables and green salads. ? Fill one-fourth of your plate with whole grains. ? Fill one-fourth of your plate with low-fat (lean) protein foods.  Eat fish that is high in omega-3 fats at least two times a week. This includes mackerel, tuna, sardines, and salmon.  Eat foods that are high in fiber, such as whole grains, beans, apples, broccoli, carrots, peas, and barley. General tips   Work with your doctor to lose weight if you need to.  Avoid: ? Foods with added sugar. ? Fried foods. ? Foods with partially hydrogenated oils.  Limit alcohol intake to no more than 1 drink a day for nonpregnant women and 2 drinks a day for men. One drink equals 12 oz of beer, 5 oz of wine, or 1 oz of hard liquor. Reading food labels  Check food labels for: ? Trans fats. ? Partially hydrogenated oils. ? Saturated fat (g) in each serving. ? Cholesterol (mg) in each serving. ? Fiber (g) in each serving.  Choose foods with healthy fats, such as: ? Monounsaturated fats. ? Polyunsaturated fats. ? Omega-3 fats.  Choose grain products that have whole grains. Look for the word "whole" as the first word in the ingredient list. Cooking  Cook foods using low-fat methods. These include baking, boiling, grilling, and broiling.   Eat more home-cooked foods. Eat at restaurants and buffets less often.  Avoid cooking using saturated fats, such as butter, cream, palm oil, palm kernel oil, and coconut oil. Recommended foods  Fruits  All fresh, canned (in natural juice), or frozen fruits. Vegetables  Fresh or frozen vegetables (raw, steamed, roasted, or grilled). Green salads. Grains  Whole grains, such as whole wheat or whole grain breads, crackers, cereals, and pasta. Unsweetened oatmeal, bulgur, barley, quinoa, or brown rice. Corn or whole wheat flour tortillas. Meats and other protein foods  Ground beef (85% or leaner), grass-fed beef, or beef trimmed of fat. Skinless chicken or [place]. Ground chicken or [place]. Pork trimmed of fat. All fish and seafood. Egg whites. Dried beans, peas, or lentils. Unsalted nuts or seeds. Unsalted canned beans. Nut butters without added sugar or oil. Dairy  Low-fat or nonfat dairy products, such as skim or 1% milk, 2% or reduced-fat cheeses, low-fat and fat-free ricotta or cottage cheese, or plain low-fat and nonfat yogurt. Fats and oils  Tub margarine without trans fats. Light or reduced-fat mayonnaise and salad dressings. Avocado. Olive, canola, sesame, or safflower oils. The items listed above may not be a complete list of foods and beverages you can eat. Contact a dietitian for more information. Foods to avoid Fruits  Canned fruit in heavy syrup. Fruit in cream or butter sauce. Fried fruit. Vegetables  Vegetables cooked in cheese, cream, or butter sauce. Fried vegetables. Grains  White bread. White pasta. White rice.  Cornbread. Bagels, pastries, and croissants. Crackers and snack foods that contain trans fat and hydrogenated oils. Meats and other protein foods  Fatty cuts of meat. Ribs, chicken wings, bacon, sausage, bologna, salami, chitterlings, fatback, hot dogs, bratwurst, and packaged lunch meats. Liver and organ meats. Whole eggs and egg yolks. Chicken and [place]  with skin. Fried meat. Dairy  Whole or 2% milk, cream, half-and-half, and cream cheese. Whole milk cheeses. Whole-fat or sweetened yogurt. Full-fat cheeses. Nondairy creamers and whipped toppings. Processed cheese, cheese spreads, and cheese curds. Beverages  Alcohol. Sugar-sweetened drinks such as sodas, lemonade, and fruit drinks. Fats and oils  Butter, stick margarine, lard, shortening, ghee, or bacon fat. Coconut, palm kernel, and palm oils. Sweets and desserts  Corn syrup, sugars, honey, and molasses. Candy. Jam and jelly. Syrup. Sweetened cereals. Cookies, pies, cakes, donuts, muffins, and ice cream. The items listed above may not be a complete list of foods and beverages you should avoid. Contact a dietitian for more information. Summary  Choosing the right foods helps keep your fat and cholesterol at normal levels. This can keep you from getting certain diseases.  At meals, fill one-half of your plate with vegetables and green salads.  Eat high-fiber foods, like whole grains, beans, apples, carrots, peas, and barley.  Limit added sugar, saturated fats, alcohol, and fried foods. This information is not intended to replace advice given to you by your health care provider. Make sure you discuss any questions you have with your health care provider. Document Released: 02/20/2012 Document Revised: 04/24/2018 Document Reviewed: 05/08/2017 Elsevier Patient Education  2020 [employer].

## 2019-04-15 ENCOUNTER — Encounter: Payer: Self-pay | Admitting: *Deleted

## 2019-04-15 DIAGNOSIS — H2513 Age-related nuclear cataract, bilateral: Secondary | ICD-10-CM | POA: Diagnosis not present

## 2019-04-15 DIAGNOSIS — E119 Type 2 diabetes mellitus without complications: Secondary | ICD-10-CM | POA: Diagnosis not present

## 2019-04-15 DIAGNOSIS — H43813 Vitreous degeneration, bilateral: Secondary | ICD-10-CM | POA: Diagnosis not present

## 2019-04-15 LAB — HM DIABETES EYE EXAM

## 2019-04-22 ENCOUNTER — Encounter: Payer: Self-pay | Admitting: Podiatry

## 2019-04-22 ENCOUNTER — Other Ambulatory Visit: Payer: Self-pay

## 2019-04-22 ENCOUNTER — Ambulatory Visit (INDEPENDENT_AMBULATORY_CARE_PROVIDER_SITE_OTHER): Payer: Medicare Other | Admitting: Podiatry

## 2019-04-22 VITALS — BP 176/90 | HR 83 | Temp 97.4°F

## 2019-04-22 DIAGNOSIS — B351 Tinea unguium: Secondary | ICD-10-CM

## 2019-04-22 DIAGNOSIS — M79674 Pain in right toe(s): Secondary | ICD-10-CM | POA: Diagnosis not present

## 2019-04-22 DIAGNOSIS — M79675 Pain in left toe(s): Secondary | ICD-10-CM

## 2019-04-22 DIAGNOSIS — E119 Type 2 diabetes mellitus without complications: Secondary | ICD-10-CM | POA: Diagnosis not present

## 2019-04-22 NOTE — Patient Instructions (Addendum)
Diabetes Mellitus and Foot Care Foot care is an important part of your health, especially when you have diabetes. Diabetes may cause you to have problems because of poor blood flow (circulation) to your feet and legs, which can cause your skin to:  Become thinner and drier.  Break more easily.  Heal more slowly.  Peel and crack. You may also have nerve damage (neuropathy) in your legs and feet, causing decreased feeling in them. This means that you may not notice minor injuries to your feet that could lead to more serious problems. Noticing and addressing any potential problems early is the best way to prevent future foot problems. How to care for your feet Foot hygiene  Wash your feet daily with warm water and mild soap. Do not use hot water. Then, pat your feet and the areas between your toes until they are completely dry. Do not soak your feet as this can dry your skin.  Trim your toenails straight across. Do not dig under them or around the cuticle. File the edges of your nails with an emery board or nail file.  Apply a moisturizing lotion or petroleum jelly to the skin on your feet and to dry, brittle toenails. Use lotion that does not contain alcohol and is unscented. Do not apply lotion between your toes. Shoes and socks  Wear clean socks or stockings every day. Make sure they are not too tight. Do not wear knee-high stockings since they may decrease blood flow to your legs.  Wear shoes that fit properly and have enough cushioning. Always look in your shoes before you put them on to be sure there are no objects inside.  To break in new shoes, wear them for just a few hours a day. This prevents injuries on your feet. Wounds, scrapes, corns, and calluses  Check your feet daily for blisters, cuts, bruises, sores, and redness. If you cannot see the bottom of your feet, use a mirror or ask someone for help.  Do not cut corns or calluses or try to remove them with medicine.  If you  find a minor scrape, cut, or break in the skin on your feet, keep it and the skin around it clean and dry. You may clean these areas with mild soap and water. Do not clean the area with peroxide, alcohol, or iodine.  If you have a wound, scrape, corn, or callus on your foot, look at it several times a day to make sure it is healing and not infected. Check for: ? Redness, swelling, or pain. ? Fluid or blood. ? Warmth. ? Pus or a bad smell. General instructions  Do not cross your legs. This may decrease blood flow to your feet.  Do not use heating pads or hot water bottles on your feet. They may burn your skin. If you have lost feeling in your feet or legs, you may not know this is happening until it is too late.  Protect your feet from hot and cold by wearing shoes, such as at the beach or on hot pavement.  Schedule a complete foot exam at least once a year (annually) or more often if you have foot problems. If you have foot problems, report any cuts, sores, or bruises to your health care provider immediately. Contact a health care provider if:  You have a medical condition that increases your risk of infection and you have any cuts, sores, or bruises on your feet.  You have an injury that is not   healing.  You have redness on your legs or feet.  You feel burning or tingling in your legs or feet.  You have pain or cramps in your legs and feet.  Your legs or feet are numb.  Your feet always feel cold.  You have pain around a toenail. Get help right away if:  You have a wound, scrape, corn, or callus on your foot and: ? You have pain, swelling, or redness that gets worse. ? You have fluid or blood coming from the wound, scrape, corn, or callus. ? Your wound, scrape, corn, or callus feels warm to the touch. ? You have pus or a bad smell coming from the wound, scrape, corn, or callus. ? You have a fever. ? You have a red line going up your leg. Summary  Check your feet every day  for cuts, sores, red spots, swelling, and blisters.  Moisturize feet and legs daily.  Wear shoes that fit properly and have enough cushioning.  If you have foot problems, report any cuts, sores, or bruises to your health care provider immediately.  Schedule a complete foot exam at least once a year (annually) or more often if you have foot problems. This information is not intended to replace advice given to you by your health care provider. Make sure you discuss any questions you have with your health care provider. Document Released: 08/18/2000 Document Revised: 10/03/2017 Document Reviewed: 09/22/2016 Elsevier Patient Education  2020 [employer]  Ingrown Toenail An ingrown toenail occurs when the corner or sides of a toenail grow into the surrounding skin. This causes discomfort and pain. The big toe is most commonly affected, but any of the toes can be affected. If an ingrown toenail is not treated, it can become infected. What are the causes? This condition may be caused by:  Wearing shoes that are too small or tight.  An injury, such as stubbing your toe or having your toe stepped on.  Improper cutting or care of your toenails.  Having nail or foot abnormalities that were present from birth (congenital abnormalities), such as having a nail that is too big for your toe. What increases the risk? The following factors may make you more likely to develop ingrown toenails:  Age. Nails tend to get thicker with age, so ingrown nails are more common among older people.  Cutting your toenails incorrectly, such as cutting them very short or cutting them unevenly. An ingrown toenail is more likely to get infected if you have:  Diabetes.  Blood flow (circulation) problems. What are the signs or symptoms? Symptoms of an ingrown toenail may include:  Pain, soreness, or tenderness.  Redness.  Swelling.  Hardening of the skin that surrounds the toenail. Signs that an ingrown  toenail may be infected include:  Fluid or pus.  Symptoms that get worse instead of better. How is this diagnosed? An ingrown toenail may be diagnosed based on your medical history, your symptoms, and a physical exam. If you have fluid or blood coming from your toenail, a sample may be collected to test for the specific type of bacteria that is causing the infection. How is this treated? Treatment depends on how severe your ingrown toenail is. You may be able to care for your toenail at home.  If you have an infection, you may be prescribed antibiotic medicines.  If you have fluid or pus draining from your toenail, your health care provider may drain it.  If you have trouble walking, you   may be given crutches to use.  If you have a severe or infected ingrown toenail, you may need a procedure to remove part or all of the nail. Follow these instructions at home: Foot care   Do not pick at your toenail or try to remove it yourself.  Soak your foot in warm, soapy water. Do this for 20 minutes, 3 times a day, or as often as told by your health care provider. This helps to keep your toe clean and keep your skin soft.  Wear shoes that fit well and are not too tight. Your health care provider may recommend that you wear open-toed shoes while you heal.  Trim your toenails regularly and carefully. Cut your toenails straight across to prevent injury to the skin at the corners of the toenail. Do not cut your nails in a curved shape.  Keep your feet clean and dry to help prevent infection. Medicines  Take over-the-counter and prescription medicines only as told by your health care provider.  If you were prescribed an antibiotic, take it as told by your health care provider. Do not stop taking the antibiotic even if you start to feel better. Activity  Return to your normal activities as told by your health care provider. Ask your health care provider what activities are safe for you.  Avoid  activities that cause pain. General instructions  If your health care provider told you to use crutches to help you move around, use them as instructed.  Keep all follow-up visits as told by your health care provider. This is important. Contact a health care provider if:  You have more redness, swelling, pain, or other symptoms that do not improve with treatment.  You have fluid, blood, or pus coming from your toenail. Get help right away if:  You have a red streak on your skin that starts at your foot and spreads up your leg.  You have a fever. Summary  An ingrown toenail occurs when the corner or sides of a toenail grow into the surrounding skin. This causes discomfort and pain. The big toe is most commonly affected, but any of the toes can be affected.  If an ingrown toenail is not treated, it can become infected.  Fluid or pus draining from your toenail is a sign of infection. Your health care provider may need to drain it. You may be given antibiotics to treat the infection.  Trimming your toenails regularly and properly can help you prevent an ingrown toenail. This information is not intended to replace advice given to you by your health care provider. Make sure you discuss any questions you have with your health care provider. Document Released: 08/18/2000 Document Revised: 12/13/2018 Document Reviewed: 05/09/2017 Elsevier Patient Education  2020 Elsevier Inc.  

## 2019-05-01 NOTE — Progress Notes (Signed)
Subjective: Jocelyn Hendrix presents today referred by Gayland Curry, DO for diabetic foot evaluation.  Patient patient insists she is not diabetic, but she is borderline diabetic.  Patient denies any history of foot wounds.  Patient denies any history of numbness, tingling, burning, pins/needles sensations. She does have h/o Guillain-Barre Syndrome.   Today, patient c/o of painful, discolored, thick toenails which interfere with daily activities.  Most symptomatic is her right great toe on today.  She suspects she has a possible ingrown toenail.  She denies any redness, drainage or swelling from the area.  She states that it is very tender when wearing enclosed shoe gear.  She has made no attempt to trim it.  .  Past Medical History:  Diagnosis Date  . Diabetes mellitus   . Hyperlipidemia   . Hypertension   . Myalgia and myositis, unspecified   . 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 Vitamin D deficiency     Patient Active Problem List   Diagnosis Date Noted  . Diverticulosis 12/12/2018  . History of Guillain-Barre syndrome 06/01/2016  . Diverticulitis 11/06/2013  . Type 2 diabetes mellitus with other specified complication ([place]) 29/52/8413  . Hyperlipidemia associated with type 2 diabetes mellitus ([place]) 12/05/2012  . Essential hypertension, benign 12/05/2012    Past Surgical History:  Procedure Laterality Date  . [facility] placed for Guillain-Barre due to poisoning     Current Outpatient Medications:  .  aspirin 81 MG tablet, Take 81 mg by mouth daily., Disp: , Rfl:  .  glucose blood ([employer] GLUCOSE METER TEST STRIPS) test strip, 1 each by Other route as needed for other. Use as instructed check 1 to 2 times daily   (DX 250.00), Disp: 200 each, Rfl: 3 .  Lancets (ACCU-CHEK SOFT TOUCH) lancets, 1 each by Other route as needed for other. Use as instructed  DX E11.9, Disp: 200 each, Rfl: 3 .  lisinopril (PRINIVIL,ZESTRIL) 40 MG tablet, TAKE  ONE TABLET BY MOUTH ONCE DAILY FOR BLOOD PRESSURE, Disp: 90 tablet, Rfl: 1 .  metFORMIN (GLUCOPHAGE) 500 MG tablet, Take 1 tablet (500 mg total) by mouth 3 (three) times daily., Disp: 270 tablet, Rfl: 1  No Known Allergies  Social History   Occupational History  . Not on file  Tobacco Use  . Smoking status: Never Smoker  . Smokeless tobacco: Never Used  Substance and Sexual Activity  . Alcohol use: No    Alcohol/week: 0.0 standard drinks  . Drug use: No  . Sexual activity: Not Currently    Birth control/protection: Post-menopausal    Family History  Problem Relation Age of Onset  . Heart failure Mother   . Diabetes Mother   . Alzheimer's disease Sister     Immunization History  Administered Date(s) Administered  . Tdap 11/13/2011    Review of systems: Positive Findings in bold print.  Constitutional:  chills, fatigue, fever, sweats, weight change Communication: Optometrist, sign Ecologist, hand writing, iPad/Android device Head: headaches, head injury Eyes: changes in vision, eye pain, glaucoma, cataracts, macular degeneration, diplopia, glare,  light sensitivity, eyeglasses or contacts, blindness Ears nose mouth throat: hearing impaired, hearing aids,  ringing in ears, deaf, sign language,  vertigo, nosebleeds,  rhinitis,  cold sores, snoring, swollen glands Cardiovascular: HTN, edema, arrhythmia, pacemaker in place, defibrillator in place, chest pain/tightness, chronic anticoagulation, blood clot, heart failure, MI Peripheral Vascular: leg cramps, varicose veins, blood clots, lymphedema, varicosities Respiratory:  difficulty breathing, denies congestion, SOB,  wheezing, cough, emphysema Gastrointestinal: change in appetite or weight, abdominal pain, constipation, diarrhea, nausea, vomiting, vomiting blood, change in bowel habits, abdominal pain, jaundice, rectal bleeding, hemorrhoids, GERD Genitourinary:  nocturia,  pain on urination, polyuria,  blood in urine,  Foley catheter, urinary urgency, ESRD on hemodialysis Musculoskeletal: amputation, cramping, stiff joints, painful joints, decreased joint motion, fractures, OA, gout, hemiplegia, paraplegia, uses cane, wheelchair bound, uses walker, uses rollator Skin: +changes in toenails, color change, dryness, itching, mole changes,  rash, wound(s) Neurological: headaches, numbness in feet, paresthesias in feet, burning in feet, fainting,  seizures, change in speech,  headaches, memory problems/poor historian, cerebral palsy, weakness, paralysis, CVA, TIA Endocrine: diabetes, hypothyroidism, hyperthyroidism,  goiter, dry mouth, flushing, heat intolerance,  cold intolerance,  excessive thirst, denies polyuria,  nocturia Hematological:  easy bleeding, excessive bleeding, easy bruising, enlarged lymph nodes, on long term blood thinner, history of past transusions Allergy/immunological:  hives, eczema, frequent infections, multiple drug allergies, seasonal allergies, transplant recipient, multiple food allergies Psychiatric:  anxiety, depression, mood disorder, suicidal ideations, hallucinations, insomnia  Objective: Vitals:   04/22/19 1112  BP: (!) 176/90  Pulse: 83  Temp: (!) 97.4 F (36.3 C)   Vascular Examination: Capillary refill time is immediate to all 10 digits.  Dorsalis pedis and posterior tibial pulses are palpable bilaterally.  There is decreased digital hair noted bilaterally.  There is no edema noted bilaterally.  No pain with calf compression noted bilaterally.   Dermatological Examination: Skin with normal turgor, texture and tone b/l. Toenails 1-5 b/l discolored, thick, dystrophic with subungual debris and pain with palpation to nailbeds due to thickness of nails. Incurvated nailplate medial border bilateral great toes with tenderness to palpation. No erythema, no edema, no drainage noted.  Musculoskeletal: Muscle strength 5/5 to all LE muscle groups.  Neurological: Sensation intact 5/5  bilaterally with 10 gram monofilament.  Vibratory sensation intact bilaterally.  Assessment: 1. Painful onychomycosis toenails 1-5 b/l  2. Ingrown toenail bilateral great toes, noninfected. 3. NIDDM  Plan: 1. Reviewed Dr. Ernest Mallickeed's last note and patient has apparently been diagnosed with diabetes.  I see no classification of borderline diabetes.  Discussed diabetic foot care principles. Literature dispensed on today. 2. Toenails 1-5 b/l were debrided in length and girth without iatrogenic bleeding.  Offending nail borders debrided and curretaged b/l great toes. Borders cleansed with alcohol. Antibiotic ointment applied.  Patient noted relief with today's treatment.  No further treatment required by patient. 3. Patient to continue soft, supportive shoe gear. 4. Patient to report any pedal injuries to medical professional immediately. 5. Follow up 3 months.  6. Patient/POA to call should there be a concern in the interim.

## 2019-05-11 ENCOUNTER — Other Ambulatory Visit: Payer: Self-pay | Admitting: Internal Medicine

## 2019-07-23 ENCOUNTER — Encounter: Payer: Self-pay | Admitting: Podiatry

## 2019-07-23 ENCOUNTER — Ambulatory Visit (INDEPENDENT_AMBULATORY_CARE_PROVIDER_SITE_OTHER): Payer: Medicare Other | Admitting: Podiatry

## 2019-07-23 ENCOUNTER — Other Ambulatory Visit: Payer: Self-pay

## 2019-07-23 DIAGNOSIS — M79675 Pain in left toe(s): Secondary | ICD-10-CM

## 2019-07-23 DIAGNOSIS — M79674 Pain in right toe(s): Secondary | ICD-10-CM

## 2019-07-23 DIAGNOSIS — B351 Tinea unguium: Secondary | ICD-10-CM | POA: Diagnosis not present

## 2019-07-23 NOTE — Patient Instructions (Signed)
Diabetes Mellitus and Foot Care Foot care is an important part of your health, especially when you have diabetes. Diabetes may cause you to have problems because of poor blood flow (circulation) to your feet and legs, which can cause your skin to:  Become thinner and drier.  Break more easily.  Heal more slowly.  Peel and crack. You may also have nerve damage (neuropathy) in your legs and feet, causing decreased feeling in them. This means that you may not notice minor injuries to your feet that could lead to more serious problems. Noticing and addressing any potential problems early is the best way to prevent future foot problems. How to care for your feet Foot hygiene  Wash your feet daily with warm water and mild soap. Do not use hot water. Then, pat your feet and the areas between your toes until they are completely dry. Do not soak your feet as this can dry your skin.  Trim your toenails straight across. Do not dig under them or around the cuticle. File the edges of your nails with an emery board or nail file.  Apply a moisturizing lotion or petroleum jelly to the skin on your feet and to dry, brittle toenails. Use lotion that does not contain alcohol and is unscented. Do not apply lotion between your toes. Shoes and socks  Wear clean socks or stockings every day. Make sure they are not too tight. Do not wear knee-high stockings since they may decrease blood flow to your legs.  Wear shoes that fit properly and have enough cushioning. Always look in your shoes before you put them on to be sure there are no objects inside.  To break in new shoes, wear them for just a few hours a day. This prevents injuries on your feet. Wounds, scrapes, corns, and calluses  Check your feet daily for blisters, cuts, bruises, sores, and redness. If you cannot see the bottom of your feet, use a mirror or ask someone for help.  Do not cut corns or calluses or try to remove them with medicine.  If you  find a minor scrape, cut, or break in the skin on your feet, keep it and the skin around it clean and dry. You may clean these areas with mild soap and water. Do not clean the area with peroxide, alcohol, or iodine.  If you have a wound, scrape, corn, or callus on your foot, look at it several times a day to make sure it is healing and not infected. Check for: ? Redness, swelling, or pain. ? Fluid or blood. ? Warmth. ? Pus or a bad smell. General instructions  Do not cross your legs. This may decrease blood flow to your feet.  Do not use heating pads or hot water bottles on your feet. They may burn your skin. If you have lost feeling in your feet or legs, you may not know this is happening until it is too late.  Protect your feet from hot and cold by wearing shoes, such as at the beach or on hot pavement.  Schedule a complete foot exam at least once a year (annually) or more often if you have foot problems. If you have foot problems, report any cuts, sores, or bruises to your health care provider immediately. Contact a health care provider if:  You have a medical condition that increases your risk of infection and you have any cuts, sores, or bruises on your feet.  You have an injury that is not   healing.  You have redness on your legs or feet.  You feel burning or tingling in your legs or feet.  You have pain or cramps in your legs and feet.  Your legs or feet are numb.  Your feet always feel cold.  You have pain around a toenail. Get help right away if:  You have a wound, scrape, corn, or callus on your foot and: ? You have pain, swelling, or redness that gets worse. ? You have fluid or blood coming from the wound, scrape, corn, or callus. ? Your wound, scrape, corn, or callus feels warm to the touch. ? You have pus or a bad smell coming from the wound, scrape, corn, or callus. ? You have a fever. ? You have a red line going up your leg. Summary  Check your feet every day  for cuts, sores, red spots, swelling, and blisters.  Moisturize feet and legs daily.  Wear shoes that fit properly and have enough cushioning.  If you have foot problems, report any cuts, sores, or bruises to your health care provider immediately.  Schedule a complete foot exam at least once a year (annually) or more often if you have foot problems. This information is not intended to replace advice given to you by your health care provider. Make sure you discuss any questions you have with your health care provider. Document Released: 08/18/2000 Document Revised: 10/03/2017 Document Reviewed: 09/22/2016 Elsevier Patient Education  2020 [employer]  

## 2019-07-30 NOTE — Progress Notes (Signed)
Subjective:  Jocelyn Hendrix presents to clinic today with cc of  painful, thick, discolored, elongated toenails  of both feet that become tender and patient cannot cut because of thickness. Pain is aggravated when wearing enclosed shoe gear and relieved with periodic professional debridement.  Patient voices no new pedal concerns on today's visit.  Medications reviewed in chart.  No Known Allergies   Objective:  Physical Examination:  Vascular Examination: Capillary refill time immediate b/l.  Palpable DP/PT pulses b/l.  Digital hair sparse b/l.  No edema noted b/l.  Skin temperature gradient WNL b/l.  Dermatological Examination: Skin with normal turgor, texture and tone b/l.  No open wounds b/l.  No interdigital macerations noted b/l.  Elongated, thick, discolored brittle toenails with subungual debris and pain on dorsal palpation of nailbeds 1-5 b/l.  Musculoskeletal Examination: Muscle strength 5/5 to all muscle groups b/l.  No pain, crepitus or joint discomfort with active/passive ROM.  Neurological Examination: Sensation intact 5/5 b/l with 10 gram monofilament.  Vibratory sensation intact b/l.  Proprioceptive sensation intact b/l.  Assessment: Mycotic nail infection with pain 1-5 b/l  Plan: 1. Toenails 1-5 b/l were debrided in length and girth without iatrogenic laceration. 2.  Continue soft, supportive shoe gear daily. 3.  Report any pedal injuries to medical professional. 4.  Follow up 3 months. 5.  Patient/POA to call should there be a question/concern in there interim.

## 2019-08-05 ENCOUNTER — Telehealth: Payer: Self-pay

## 2019-08-05 NOTE — Telephone Encounter (Signed)
Incoming fax received from [facility] requesting order for back brace.  I called patient to confirm that she requested and patient states she did. Patient feels she could benefit from back brace. Patient mentioned having back pain off/on. Patient has not mentioned this pain to Dr.Reed and states it comes from sitting long periods at a time.  I scheduled patient and appointment to further discuss request. Earliest patient could do a visit was Monday 08/11/2019. Patient requested a telephone visit due to transportation concerns   Form placed in clinical area in file folder dated 7

## 2019-08-10 ENCOUNTER — Emergency Department (HOSPITAL_COMMUNITY): Payer: Medicare Other

## 2019-08-10 ENCOUNTER — Encounter (HOSPITAL_COMMUNITY): Payer: Self-pay | Admitting: Emergency Medicine

## 2019-08-10 ENCOUNTER — Other Ambulatory Visit: Payer: Self-pay

## 2019-08-10 ENCOUNTER — Emergency Department (HOSPITAL_COMMUNITY)
Admission: EM | Admit: 2019-08-10 | Discharge: 2019-08-10 | Disposition: A | Discharge status: Home / Self Care | Payer: Medicare Other | Attending: Emergency Medicine | Admitting: Emergency Medicine

## 2019-08-10 DIAGNOSIS — Z7982 Long term (current) use of aspirin: Secondary | ICD-10-CM | POA: Diagnosis not present

## 2019-08-10 DIAGNOSIS — Y999 Unspecified external cause status: Secondary | ICD-10-CM | POA: Diagnosis not present

## 2019-08-10 DIAGNOSIS — S99922A Unspecified injury of left foot, initial encounter: Secondary | ICD-10-CM | POA: Diagnosis present

## 2019-08-10 DIAGNOSIS — Y92008 Other place in unspecified non-institutional (private) residence as the place of occurrence of the external cause: Secondary | ICD-10-CM | POA: Insufficient documentation

## 2019-08-10 DIAGNOSIS — Z79899 Other long term (current) drug therapy: Secondary | ICD-10-CM | POA: Diagnosis not present

## 2019-08-10 DIAGNOSIS — Z7984 Long term (current) use of oral hypoglycemic drugs: Secondary | ICD-10-CM | POA: Diagnosis not present

## 2019-08-10 DIAGNOSIS — Y9389 Activity, other specified: Secondary | ICD-10-CM | POA: Diagnosis not present

## 2019-08-10 DIAGNOSIS — I10 Essential (primary) hypertension: Secondary | ICD-10-CM | POA: Diagnosis not present

## 2019-08-10 DIAGNOSIS — E119 Type 2 diabetes mellitus without complications: Secondary | ICD-10-CM | POA: Diagnosis not present

## 2019-08-10 DIAGNOSIS — W11XXXA Fall on and from ladder, initial encounter: Secondary | ICD-10-CM | POA: Insufficient documentation

## 2019-08-10 DIAGNOSIS — S9032XA Contusion of left foot, initial encounter: Secondary | ICD-10-CM | POA: Diagnosis not present

## 2019-08-10 DIAGNOSIS — W19XXXA Unspecified fall, initial encounter: Secondary | ICD-10-CM

## 2019-08-10 DIAGNOSIS — Y92009 Unspecified place in unspecified non-institutional (private) residence as the place of occurrence of the external cause: Secondary | ICD-10-CM

## 2019-08-10 DIAGNOSIS — S0990XA Unspecified injury of head, initial encounter: Secondary | ICD-10-CM | POA: Insufficient documentation

## 2019-08-10 NOTE — ED Provider Notes (Signed)
[facility] EMERGENCY DEPARTMENT Provider Note   CSN: [id number] Arrival date & time: 08/10/19  1601     History   Chief Complaint Chief Complaint  Patient presents with  . Fall  . Toe Pain    HPI Jocelyn Hendrix is a 84 y.o. female with a past medical history of hypertension, hyperlipidemia, diabetes presenting to the ED with a chief complaint of left toe pain.  Last night approximately 15 hours ago, was trying to climb on top of a ladder to get Christmas lights out of her cabinet.  She states that her foot slipped and she fell backwards and had posterior head trauma.  She denies any loss of consciousness.  Reports some "scrapes" on her left elbow, left hand but her main complaint today is left-sided 4th toe pain.  She has been ambulatory since the fall.  She put ice on the hematoma to the back of her head and states that her pain there has improved.  She denies any vision changes, headache, vomiting, numbness in arms or legs, anticoagulant use, chest pain or dizziness prior to fall.     HPI  Past Medical History:  Diagnosis Date  . Diabetes mellitus   . Hyperlipidemia   . Hypertension   . Myalgia and myositis, unspecified   . 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 Vitamin D deficiency     Patient Active Problem List   Diagnosis Date Noted  . Diverticulosis 12/12/2018  . History of Guillain-Barre syndrome 06/01/2016  . Diverticulitis 11/06/2013  . Type 2 diabetes mellitus with other specified complication ([place]) 09/32/3557  . Hyperlipidemia associated with type 2 diabetes mellitus ([place]) 12/05/2012  . Essential hypertension, benign 12/05/2012    Past Surgical History:  Procedure Laterality Date  . [place] placed for Guillain-Barre due to poisoning     OB History   No obstetric history on file.      Home Medications    Prior to Admission medications   Medication Sig Start Date End Date Taking? Authorizing  Provider  aspirin 81 MG tablet Take 81 mg by mouth daily.   Yes [provider]  glucose blood ([employer] GLUCOSE METER TEST STRIPS) test strip 1 each by Other route as needed for other. Use as instructed check 1 to 2 times daily   (DX 250.00) 03/18/19  Yes Ngetich, Dinah C, NP  Lancets (ACCU-CHEK SOFT TOUCH) lancets 1 each by Other route as needed for other. Use as instructed  DX E11.9 03/18/19  Yes Ngetich, Dinah C, NP  lisinopril (ZESTRIL) 40 MG tablet TAKE 1 TABLET BY MOUTH ONCE DAILY FOR BLOOD PRESSURE 05/13/19  Yes Reed, Tiffany L, DO  metFORMIN (GLUCOPHAGE) 500 MG tablet Take 1 tablet (500 mg total) by mouth 3 (three) times daily. Patient taking differently: Take 500 mg by mouth daily with breakfast.  04/08/19 08/10/19 Yes Reed, Tiffany L, DO    Family History Family History  Problem Relation Age of Onset  . Heart failure Mother   . Diabetes Mother   . Alzheimer's disease Sister     Social History Social History   Tobacco Use  . Smoking status: Never Smoker  . Smokeless tobacco: Never Used  Substance Use Topics  . Alcohol use: No    Alcohol/week: 0.0 standard drinks  . Drug use: No     Allergies   Patient has no known allergies.   Review of Systems Review of Systems  Constitutional: Negative for appetite  change, chills and fever.  HENT: Negative for ear pain, rhinorrhea, sneezing and sore throat.   Eyes: Negative for photophobia and visual disturbance.  Respiratory: Negative for cough, chest tightness, shortness of breath and wheezing.   Cardiovascular: Negative for chest pain and palpitations.  Gastrointestinal: Negative for abdominal pain, blood in stool, constipation, diarrhea, nausea and vomiting.  Genitourinary: Negative for dysuria, hematuria and urgency.  Musculoskeletal: Positive for arthralgias. Negative for myalgias.  Skin: Negative for rash.  Neurological: Negative for dizziness, weakness and light-headedness.     Physical Exam Updated Vital Signs BP  (!) 194/83   Pulse 74   Temp 98.7 F (37.1 C) (Oral)   Resp 16   SpO2 99%   Physical Exam Vitals signs and nursing note reviewed.  Constitutional:      General: She is not in acute distress.    Appearance: She is well-developed.  HENT:     Head: Normocephalic and atraumatic.     Nose: Nose normal.  Eyes:     General: No scleral icterus.       Right eye: No discharge.        Left eye: No discharge.     Conjunctiva/sclera: Conjunctivae normal.     Pupils: Pupils are equal, round, and reactive to light.  Neck:     Musculoskeletal: Normal range of motion and neck supple.  Cardiovascular:     Rate and Rhythm: Normal rate and regular rhythm.     Heart sounds: Normal heart sounds. No murmur. No friction rub. No gallop.   Pulmonary:     Effort: Pulmonary effort is normal. No respiratory distress.     Breath sounds: Normal breath sounds.  Abdominal:     General: Bowel sounds are normal. There is no distension.     Palpations: Abdomen is soft.     Tenderness: There is no abdominal tenderness. There is no guarding.  Musculoskeletal: Normal range of motion.        General: Swelling and tenderness present.     Comments: Mild edema, tenderness palpation of the left fourth digit of the foot.  Able to flex without difficulty.  2+ DP pulse palpated bilaterally. No midline spinal tenderness present in lumbar, thoracic or cervical spine. No step-off palpated. No visible bruising, edema or temperature change noted. No objective signs of numbness present. No saddle anesthesia. 2+ DP pulses bilaterally. Sensation intact to light touch. Strength 5/5 in bilateral lower extremities.  Skin:    General: Skin is warm and dry.     Findings: No rash.  Neurological:     General: No focal deficit present.     Mental Status: She is alert and oriented to person, place, and time.     Cranial Nerves: No cranial nerve deficit.     Sensory: No sensory deficit.     Motor: No weakness or abnormal muscle tone.      Coordination: Coordination normal.      ED Treatments / Results  Labs (all labs ordered are listed, but only abnormal results are displayed) Labs Reviewed - No data to display  EKG None  Radiology Ct Head Wo Contrast  Result Date: 08/10/2019 CLINICAL DATA:  Head trauma EXAM: CT HEAD WITHOUT CONTRAST TECHNIQUE: Contiguous axial images were obtained from the base of the skull through the vertex without intravenous contrast. COMPARISON:  MR brain dated 06/16/2017 FINDINGS: Brain: No evidence of acute infarction, hemorrhage, hydrocephalus, extra-axial collection or mass lesion/mass effect. Vascular: There are vascular calcifications in the carotid siphons.  Skull: Normal. Negative for fracture or focal lesion. Sinuses/Orbits: No acute finding. Other: There is mild fat stranding in the soft tissues overlying the posterior left parietal bone and occipital bone. IMPRESSION: 1. No acute intracranial process. Electronically Signed   By: Romona Curls M.D.   On: 08/10/2019 11:21   Dg Foot Complete Left  Result Date: 08/10/2019 CLINICAL DATA:  Larey Seat off chair yesterday. Left foot and toe pain. Initial encounter. EXAM: LEFT FOOT - COMPLETE 3+ VIEW COMPARISON:  None. FINDINGS: There is no evidence of fracture or dislocation. Mild degenerative spurring is seen along the dorsal aspect of the talonavicular joint. Small dorsal and plantar calcaneal spurs are also seen. Soft tissues are unremarkable. IMPRESSION: 1. No acute findings. 2. Mild talonavicular degenerative spurring and calcaneal bone spurs. Electronically Signed   By: Danae Orleans M.D.   On: 08/10/2019 08:41    Procedures Procedures (including critical care time)  Medications Ordered in ED Medications - No data to display   Initial Impression / Assessment and Plan / ED Course  I have reviewed the triage vital signs and the nursing notes.  Pertinent labs & imaging results that were available during my care of the patient were reviewed by me  and considered in my medical decision making (see chart for details).  Clinical Course as of Aug 09 1226  Wynelle Link Aug 10, 2019  1101 Patient seen by myself as well as PA provider.  Briefly 84 year old female presents with mechanical fall yesterday off of a chair.  She struck the back of her head on a table.  She reports no headache but does report pain in her left fourth toe.  On exam she is well-appearing.  She has no signs of significant head trauma.  Her left toe does have ecchymoses over it is mildly tender to palpation on the distal metatarsal.  Her pedal pulses are intact.  There is no tenderness at the base of the fifth metatarsal or the midfoot to suggest Jones fracture or Lisfranc injury.  X-ray does not show acute fracture.  Pending CT imaging, if negative anticipate discharge.   [MT]    Clinical Course User Index [MT] Trifan, Kermit Balo, MD       84 year old female presents to ED for fall that occurred last night.  Larey Seat off of a ladder and struck the back of her head on the floor.  Reports left fourth toe pain.  No neurological deficits noted on exam.  Swelling of the toe without changes to range of motion or overlying skin changes.  X-rays negative.  CT of the head is unremarkable.  Will discharge home with instructions for rice and PCP follow-up  Patient is hemodynamically stable, in NAD, and able to ambulate in the ED. Evaluation does not show pathology that would require ongoing emergent intervention or inpatient treatment. I explained the diagnosis to the patient. Pain has been managed and has no complaints prior to discharge. Patient is comfortable with above plan and is stable for discharge at this time. All questions were answered prior to disposition. Strict return precautions for returning to the ED were discussed. Encouraged follow up with PCP.   An After Visit Summary was printed and given to the patient.   Portions of this note were generated with Scientist, clinical (histocompatibility and immunogenetics).  Dictation errors may occur despite best attempts at proofreading.   Final Clinical Impressions(s) / ED Diagnoses   Final diagnoses:  Contusion of left foot, initial encounter  Fall in home, initial encounter  Injury of head, initial encounter    ED Discharge Orders    None       Dietrich PatesKhatri, Mikiah Durall, PA-C 08/10/19 1227    Terald Sleeperrifan, Matthew J, MD 08/10/19 2147

## 2019-08-10 NOTE — Discharge Instructions (Signed)
Continue your home medications as previously prescribed. Take Tylenol as needed for pain. Follow-up with your primary care provider. Return to ED for worsening symptoms, additional injuries or falls, chest pain, shortness of breath, blurry vision.

## 2019-08-10 NOTE — ED Triage Notes (Addendum)
Pt states she was climbing up onto chair last night to get something out of cabinet and foot slipped.  Pt fell backwards and hit R side of head.  States she put ice on head last night and it doesn't bother her now.  Reports pain to L 3rd and 4th toes. Denies LOC.  Denies neck pain.

## 2019-08-10 NOTE — Progress Notes (Signed)
Orthopedic Tech Progress Note Patient Details:  Giani Winther [birth year] [id number] I buddy taped this young lady toes Ortho Devices Type of Ortho Device: Buddy tape Ortho Device/Splint Location: LLE Ortho Device/Splint Interventions: Application, Ordered   Post Interventions Patient Tolerated: Ambulated well, Well Instructions Provided: Poper ambulation with device, Care of device, Adjustment of device   Janit Pagan 08/10/2019, 12:49 PM

## 2019-08-11 ENCOUNTER — Ambulatory Visit: Payer: Self-pay | Admitting: Internal Medicine

## 2019-08-14 ENCOUNTER — Other Ambulatory Visit: Payer: Self-pay

## 2019-08-14 ENCOUNTER — Other Ambulatory Visit: Payer: Medicare Other

## 2019-08-14 DIAGNOSIS — I10 Essential (primary) hypertension: Secondary | ICD-10-CM

## 2019-08-14 DIAGNOSIS — E1169 Type 2 diabetes mellitus with other specified complication: Secondary | ICD-10-CM

## 2019-08-14 DIAGNOSIS — E663 Overweight: Secondary | ICD-10-CM

## 2019-08-14 DIAGNOSIS — E785 Hyperlipidemia, unspecified: Secondary | ICD-10-CM

## 2019-08-15 LAB — LIPID PANEL
Cholesterol: 188 mg/dL (ref <200)
HDL: 52 mg/dL (ref >=50)
LDL Cholesterol (Calc): 114 mg/dL (calc) — ABNORMAL HIGH
Non-HDL Cholesterol (Calc): 136 mg/dL (calc) — ABNORMAL HIGH (ref <130)
Total CHOL/HDL Ratio: 3.6 (calc) (ref <5.0)
Triglycerides: 114 mg/dL (ref <150)

## 2019-08-15 LAB — BASIC METABOLIC PANEL WITH GFR
BUN/Creatinine Ratio: 14 (calc) (ref 6–22)
BUN: 14 mg/dL (ref 7–25)
CO2: 26 mmol/L (ref 20–32)
Calcium: 9.8 mg/dL (ref 8.6–10.4)
Chloride: 105 mmol/L (ref 98–110)
Creat: 0.99 mg/dL — ABNORMAL HIGH (ref 0.60–0.88)
GFR, Est African American: 62 mL/min/{1.73_m2} (ref >=60)
GFR, Est Non African American: 53 mL/min/{1.73_m2} — ABNORMAL LOW (ref >=60)
Glucose, Bld: 122 mg/dL — ABNORMAL HIGH (ref 65–99)
Potassium: 4 mmol/L (ref 3.5–5.3)
Sodium: 139 mmol/L (ref 135–146)

## 2019-08-15 LAB — HEMOGLOBIN A1C
Hgb A1c MFr Bld: 6.9 % of total Hgb — ABNORMAL HIGH (ref <5.7)
Mean Plasma Glucose: 151 (calc)
eAG (mmol/L): 8.4 (calc)

## 2019-08-18 ENCOUNTER — Encounter: Payer: Self-pay | Admitting: Internal Medicine

## 2019-08-18 ENCOUNTER — Ambulatory Visit (INDEPENDENT_AMBULATORY_CARE_PROVIDER_SITE_OTHER): Payer: Medicare Other | Admitting: Internal Medicine

## 2019-08-18 ENCOUNTER — Encounter: Payer: Medicare Other | Admitting: Internal Medicine

## 2019-08-18 ENCOUNTER — Other Ambulatory Visit: Payer: Self-pay

## 2019-08-18 VITALS — Ht 65.0 in | Wt 156.0 lb

## 2019-08-18 DIAGNOSIS — E663 Overweight: Secondary | ICD-10-CM

## 2019-08-18 DIAGNOSIS — I10 Essential (primary) hypertension: Secondary | ICD-10-CM

## 2019-08-18 DIAGNOSIS — W07XXXS Fall from chair, sequela: Secondary | ICD-10-CM | POA: Diagnosis not present

## 2019-08-18 DIAGNOSIS — Z8669 Personal history of other diseases of the nervous system and sense organs: Secondary | ICD-10-CM | POA: Diagnosis not present

## 2019-08-18 DIAGNOSIS — E1169 Type 2 diabetes mellitus with other specified complication: Secondary | ICD-10-CM

## 2019-08-18 DIAGNOSIS — E785 Hyperlipidemia, unspecified: Secondary | ICD-10-CM | POA: Diagnosis not present

## 2019-08-18 NOTE — Progress Notes (Signed)
Patient ID: Jocelyn Hendrix, female   DOB: 1935-07-12, 84 y.o.   MRN: [id number] This service is provided via telemedicine  No vital signs collected/recorded due to the encounter was a telemedicine visit.   Location of patient (ex: home, work):  HOME  Patient consents to a telephone visit:  YES  Location of the provider (ex: office, home):  OFFICE Name of any referring provider:  DR [facility] Carole Deere, DO  Names of all persons participating in the telemedicine service and their role in the encounter:  PATIENT, Jocelyn Hendrix, Jocelyn Hendrix, DR Hollace Kinnier, DO  Time spent on call:  4:54  Virtual Visit via Video Note  I connected with Jocelyn Hendrix on 08/18/19 at  9:30 AM EST by a video enabled telemedicine application (facetime) and verified that I am speaking with the correct person using two identifiers.  Location: Patient: Jocelyn Hendrix at her home Provider: Hollace Kinnier, DO, at [facility] office   I discussed the limitations of evaluation and management by telemedicine and the availability of in person appointments. The patient expressed understanding and agreed to proceed.  History of Present Illness: 84 yo female with h/o controlled DMII, htn, hyperlipidemia, prior Guillain-Barre and recent fall that took her to ED seen via virtual visit.  Does not want back brace that company offered her.  Counseled about such offers via phone and she realizes she's at risk for being taken advantage of in these instances.  Her back does not bother her often, but was going to accept the brace if it was free, but she realizes they were asking for insurance info so it must not really be free.  She fell from a chair putting up Christmas lights.  Scraped her arm and hurt her toe.  Scraped left elbow.    Reviewed her labs.  Her granddaughter will check her BP tomorrow. Advised to call if over 150/90  Family is doing her shopping, groceries.  Only goes across the street to dump her trash and get her mail.     Observations/Objective: Alert and oriented, pleasant and conversive Old trach site present Abrasions of left elbow present  Assessment and Plan: 1. Type 2 diabetes mellitus with other specified complication, without long-term current use of insulin (HCC) -get back to walking more with mask on -watch sweets and starchy foods -hba1c stable at 6.9 -cont metformin 500mg  po bid  2. Essential hypertension, benign -bp historically borderline -educated on goal of less than 150/90 for sure for her  -her granddaughter will check it when she brings her groceries and essentials tomorrow  3. History of Guillain-Barre syndrome -is not wiling to take risk of this recurring for covid vaccine either, continue three ws  4. Hyperlipidemia associated with type 2 diabetes mellitus ([place]) -has long refused statin therapy and continues to at this point--she is 84 and functioning well  5. Overweight with body mass index (BMI) 25.0-29.9 -counseled on diet and exercise regimen again today  6. Fall from chair, sequela -when climbing onto chair to hang christmas lights--advised against doing this--family to help--she says they will definitely not allow that to happen again either -she is recovering well and had no major injury fortunately on xray of left foot or CT head  Follow Up Instructions: Continue to work on diet and exercise program--do more walking with your mask on.  F/u in 4 mos with fasting labs before.   I discussed the assessment and treatment plan with the patient. The patient was provided an opportunity to ask questions and  all were answered. The patient agreed with the plan and demonstrated an understanding of the instructions.   The patient was advised to call back or seek an in-person evaluation if the symptoms worsen or if the condition fails to improve as anticipated.  I provided 27 minutes of non-face-to-face time during this encounter.  Dandria Griego L. Tannon Peerson, D.O. Geriatrics [employer]  [facility] [address], [place] [address] Cell Phone (Mon-Fri 8am-5pm):  [phone] On Call:  [phone] & follow prompts after 5pm & weekends Office Phone:  [phone] Office Fax:  [phone]

## 2019-08-19 NOTE — Progress Notes (Signed)
error 

## 2019-11-06 ENCOUNTER — Other Ambulatory Visit: Payer: Self-pay | Admitting: Internal Medicine

## 2019-11-19 ENCOUNTER — Ambulatory Visit: Payer: Medicare Other | Admitting: Podiatry

## 2020-02-04 ENCOUNTER — Other Ambulatory Visit: Payer: Self-pay | Admitting: Family

## 2020-02-04 NOTE — Telephone Encounter (Signed)
I called patient to make a follow up appointment for June 7 @ 9am, she has not been in the office since 12/20

## 2020-02-09 ENCOUNTER — Other Ambulatory Visit: Payer: Self-pay

## 2020-02-09 ENCOUNTER — Ambulatory Visit (INDEPENDENT_AMBULATORY_CARE_PROVIDER_SITE_OTHER): Payer: Medicare Other | Admitting: Family

## 2020-02-09 ENCOUNTER — Encounter: Payer: Self-pay | Admitting: Family

## 2020-02-09 VITALS — BP 130/90 | HR 78 | Temp 97.7°F | Resp 16 | Ht 65.0 in | Wt 150.8 lb

## 2020-02-09 DIAGNOSIS — R252 Cramp and spasm: Secondary | ICD-10-CM

## 2020-02-09 DIAGNOSIS — E785 Hyperlipidemia, unspecified: Secondary | ICD-10-CM

## 2020-02-09 DIAGNOSIS — I10 Essential (primary) hypertension: Secondary | ICD-10-CM

## 2020-02-09 DIAGNOSIS — E1169 Type 2 diabetes mellitus with other specified complication: Secondary | ICD-10-CM

## 2020-02-09 LAB — MAGNESIUM: Magnesium: 2.1 mg/dL (ref 1.5–2.5)

## 2020-02-09 NOTE — Progress Notes (Signed)
Provider: Anikin Prosser FNP-C   Gayland Curry, DO  Patient Care Team: Gayland Curry, DO as PCP - General (Geriatric Medicine) Clent Jacks, MD as Consulting Physician (Ophthalmology)  Extended Emergency Contact Information Primary Emergency Contact: [place] Phone: [phone] Mobile Phone: [phone] Relation: Daughter  Code Status: Full Code  Goals of care: Advanced Directive information Advanced Directives 02/09/2020  Does Patient Have a Medical Advance Directive? Yes  Type of Paramedic of [place];Living will;Out of facility DNR (pink MOST or yellow form)  Does patient want to make changes to medical advance directive? No - Patient declined  Copy of [place] in Chart? No - copy requested  Would patient like information on creating a medical advance directive? -  Pre-existing out of facility DNR order (yellow form or pink MOST form) -     Chief Complaint  Patient presents with   Medical Management of Chronic Issues    6 Month Follow Up.   Health Maintenance    Discuss the need for Foot Exam.   Immunizations    Discuss the need for Covid Vaccine.     HPI:  Pt is a 85 y.o. female seen today for  6 months follow up for medical management of chronic diseases.she complains of right leg occasional cramp.Her daughter told her she is not drinking enough water. Hypertension - states her B/p normal at home but just the getting up and coming in for visit makes it a little high.she denies nay headache,dizziness,palpitation or chest pain.    Hyperlipidemia - used exercise but now walks back and front in the apartment.since places are opening up can go back to exercise with her friend.   Type 2 DM - recalls CBG readings in the 120's.sometimes 130's if she eats something.tries to eat smart by not eating sugar every day " eat what she wants by doesn't not over do it ".    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 If you have any questions, ask your nurse or doctor.        Accu-Chek Guide test strip Generic drug: glucose blood USE 1-2 TIMES DAILY AS NEEDED   accu-chek soft touch lancets 1 each by Other route as needed for other. Use as instructed  DX E11.9   aspirin 81 MG tablet Take 81 mg by mouth daily.   lisinopril 40 MG tablet Commonly known as: ZESTRIL TAKE 1 TABLET BY MOUTH ONCE DAILY FOR BLOOD PRESSURE   metFORMIN 500 MG tablet Commonly known as: GLUCOPHAGE TAKE 1 TABLET BY MOUTH THREE TIMES A DAY       Review of Systems  Constitutional: Negative for appetite change, chills, fatigue and fever.  HENT: Negative for congestion, rhinorrhea, sinus pressure, sinus pain, sneezing and sore throat.   Eyes: Positive for visual disturbance. Negative for pain, discharge, redness and itching.       Wears eye glasses follows up  with ophthalmology.  Respiratory: Negative for cough, chest tightness, shortness of breath and wheezing.   Cardiovascular: Negative for chest pain, palpitations and leg swelling.  Gastrointestinal: Negative for abdominal distention, abdominal pain, blood in stool, constipation, diarrhea, nausea and vomiting.  Endocrine: Negative for cold intolerance, heat intolerance, polydipsia, polyphagia and polyuria.  Genitourinary: Negative for difficulty urinating, dysuria, flank pain, frequency and urgency.  Musculoskeletal: Negative for arthralgias, gait problem, joint swelling and myalgias.       Larey Seat 2020 but none  since then.  Skin: Negative for color change, pallor and rash.  Neurological: Negative for dizziness, speech difficulty, weakness, light-headedness, numbness and headaches.  Hematological: Does not bruise/bleed easily.  Psychiatric/Behavioral: Negative for agitation, confusion and sleep disturbance. The patient is not nervous/anxious.     Immunization History  Administered Date(s) Administered   Tdap 11/13/2011   Pertinent  Health Maintenance Due  Topic Date Due   FOOT EXAM  01/22/2019   HEMOGLOBIN A1C  02/12/2020   OPHTHALMOLOGY EXAM  04/14/2020   DEXA SCAN  Completed   Fall Risk  02/09/2020 08/18/2019 04/14/2019 03/18/2019 12/12/2018  Falls in the past year? 0 1 0 0 0  Number falls in past yr: 0 0 0 0 0  Comment - - - - -  Injury with Fall? 0 1 0 0 0    Vitals:   02/09/20 0845  BP: 130/90  Pulse: 78  Resp: 16  Temp: 97.7 F (36.5 C)  SpO2: 98%  Weight: 150 lb 12.8 oz (68.4 kg)  Height: 5\' 5"  (1.651 m)   Body mass index is 25.09 kg/m. Physical Exam Vitals reviewed.  Constitutional:      General: She is not in acute distress.    Appearance: She is overweight. She is not ill-appearing.  HENT:     Head: Normocephalic.     Right Ear: Tympanic membrane, ear canal and external ear normal. There is no impacted cerumen.     Left Ear: Tympanic membrane, ear canal and external ear normal. There is no impacted cerumen.     Nose: Nose normal. No congestion or rhinorrhea.     Mouth/Throat:     Mouth: Mucous membranes are moist.     Pharynx: Oropharynx is clear. No oropharyngeal exudate or posterior oropharyngeal erythema.  Eyes:     General: No scleral icterus.       Right eye: No discharge.        Left eye: No discharge.     Extraocular Movements: Extraocular movements intact.     Conjunctiva/sclera: Conjunctivae normal.     Pupils: Pupils are equal, round, and reactive to light.     Comments: Corrective lens in place.   Neck:     Vascular: No carotid bruit.    Cardiovascular:     Rate and Rhythm: Normal rate and regular rhythm.     Pulses: Normal pulses.     Heart sounds: Normal heart sounds. No murmur. No friction rub. No gallop.   Pulmonary:     Effort: Pulmonary effort is normal. No respiratory distress.     Breath sounds: Normal breath sounds. No wheezing, rhonchi or rales.  Chest:     Chest wall: No tenderness.  Abdominal:     General: Bowel sounds are normal. There is no distension.     Palpations: Abdomen is soft. There is no mass.     Tenderness: There is no abdominal tenderness. There is no right CVA tenderness, left CVA tenderness, guarding or rebound.  Musculoskeletal:  General: No swelling or tenderness. Normal range of motion.     Cervical back: Normal range of motion. No rigidity or tenderness.     Right lower leg: No edema.     Left lower leg: No edema.  Lymphadenopathy:     Cervical: No cervical adenopathy.  Skin:    General: Skin is warm and dry.     Coloration: Skin is not pale.     Findings: No bruising, erythema or rash.  Neurological:     Mental Status: She is alert and oriented to person, place, and time.     Cranial Nerves: No cranial nerve deficit.     Sensory: No sensory deficit.     Motor: No weakness.     Coordination: Coordination normal.     Gait: Gait normal.  Psychiatric:        Mood and Affect: Mood normal.        Behavior: Behavior normal.        Thought Content: Thought content normal.        Judgment: Judgment normal.    Labs reviewed: Recent Labs    04/09/19 0821 08/14/19 0835  NA 138 139  K 4.5 4.0  CL 104 105  CO2 29 26  GLUCOSE 134* 122*  BUN 18 14  CREATININE 0.82 0.99*  CALCIUM 9.9 9.8   Recent Labs    04/09/19 0821  AST 26  ALT 15  BILITOT 0.3  PROT 7.5   Recent Labs    04/09/19 0821  WBC 6.6  NEUTROABS 2,957  HGB 13.1  HCT 39.8  MCV 89.4  PLT 133*   Lab Results  Component Value Date   TSH 2.88 05/30/2016   Lab Results  Component Value Date    HGBA1C 6.9 (H) 08/14/2019   Lab Results  Component Value Date   CHOL 188 08/14/2019   HDL 52 08/14/2019   LDLCALC 114 (H) 08/14/2019   LDLDIRECT 75 09/26/2016   TRIG 114 08/14/2019   CHOLHDL 3.6 08/14/2019    Significant Diagnostic Results in last 30 days:  No results found.  Assessment/Plan 1. Essential hypertension, benign Home readings normal.continue on lisinopril 40 mg tablet daily.continue on ASA for chest pain prophylaxis. Has ordered labs.  2. Type 2 diabetes mellitus with other specified complication, without long-term current use of insulin (HCC) Lab Results  Component Value Date   HGBA1C 6.9 (H) 08/14/2019  CBG controlled.continue on metformin 500 mg tablet one by mouth three times daily.foot exam intact.. - Annual eye exam Up to date. - continue on ASA for CV event prophylaxis Due for A1C lab orders in place.     3. Hyperlipidemia associated with type 2 diabetes mellitus (HCC) Latest LDL not at goal. Continue with dietary modification.Encouraged to restart her exercises  4. Muscle cramps Encouraged to increase water intake 6-8 glasses daily.Has BMP ordered from previous visit.will add mg level. - Magnesium  Family/ staff Communication: Reviewed plan of care with patient verbalized under standing.  Labs/tests ordered: - Magnesium level.  Next Appointment : 6 months for medical management of chronic issues with Dr.Reed.   Caesar Bookman, NP

## 2020-02-10 ENCOUNTER — Telehealth: Payer: Self-pay

## 2020-02-10 LAB — BASIC METABOLIC PANEL
BUN/Creatinine Ratio: 22 (calc) (ref 6–22)
BUN: 20 mg/dL (ref 7–25)
CO2: 27 mmol/L (ref 20–32)
Calcium: 10.1 mg/dL (ref 8.6–10.4)
Chloride: 104 mmol/L (ref 98–110)
Creat: 0.9 mg/dL — ABNORMAL HIGH (ref 0.60–0.88)
Glucose, Bld: 98 mg/dL (ref 65–139)
Potassium: 4.6 mmol/L (ref 3.5–5.3)
Sodium: 137 mmol/L (ref 135–146)

## 2020-02-10 LAB — LIPID PANEL
Cholesterol: 209 mg/dL — ABNORMAL HIGH (ref <200)
HDL: 52 mg/dL (ref >=50)
LDL Cholesterol (Calc): 131 mg/dL (calc) — ABNORMAL HIGH
Non-HDL Cholesterol (Calc): 157 mg/dL (calc) — ABNORMAL HIGH (ref <130)
Total CHOL/HDL Ratio: 4 (calc) (ref <5.0)
Triglycerides: 148 mg/dL (ref <150)

## 2020-02-10 LAB — HEMOGLOBIN A1C
Hgb A1c MFr Bld: 6.5 % of total Hgb — ABNORMAL HIGH (ref <5.7)
Mean Plasma Glucose: 140 (calc)
eAG (mmol/L): 7.7 (calc)

## 2020-02-10 MED ORDER — ROSUVASTATIN CALCIUM 5 MG PO TABS
5.0000 mg | ORAL_TABLET | Freq: Every day | ORAL | 5 refills | Status: DC
Start: 2020-02-10 — End: 2020-03-23

## 2020-02-10 NOTE — Telephone Encounter (Signed)
-----   Message from Caesar Bookman, NP sent at 02/10/2020  8:59 AM EDT ----- 1. Cholesterol,LDL are higher than previous level.Recommend starting on Crestor 5 mg tablet one by mouth daily to lower cholesterol and LDL to prevent cardiovascular event such as stroke and heart attack..Also modify diet to low carbohydrates,low saturated fats( Avoiding fried foods) and include vegetables in det. 2. Hgb A1C still high but has slightly improved.continue to cut down on sugary foods sweets and soft drinks. 3. Electrolytes are normal.Kidney function slightly high but stable compared to previous labs.

## 2020-02-10 NOTE — Telephone Encounter (Signed)
Discussed results with patient, patient verbalized understanding of results  Patient in agreement with starting cholesterol lowering medication. RX sent to pharmacy on file(verifed) for 30 day supply as requested.  Patient states she hopes she can tolerate medication. Patient aware that if she has any issues to contact our office piror to stopping any medication. Patient agreed.   Copy of labs mailed to patient as requested for her daughter to view.

## 2020-02-17 ENCOUNTER — Telehealth: Payer: Self-pay

## 2020-02-17 NOTE — Telephone Encounter (Signed)
Excerpt from [employer] website: "People who have previously had Guillain-Barre syndrome (GBS): People who have previously had GBS may receive a COVID-19 vaccine. To date, no cases of GBS have been reported following vaccination in participants in the mRNA COVID-19 vaccine clinical trials.Jan 16, 2020"  Therefore, I do recommend Jocelyn Hendrix proceed with getting her covid-19 vaccine.  This can be done at her local [facility] or [facility] at this point where she can call for an appointment.

## 2020-02-17 NOTE — Telephone Encounter (Signed)
Patient called requesting your approval to get the covid vaccinations since she hasn't been able to get vaccinations in the past due to her guillain-barre syndrome. Routing to Dr. Renato Gails

## 2020-02-18 NOTE — Telephone Encounter (Signed)
Discussed with patient. No further questions

## 2020-03-18 DIAGNOSIS — Z23 Encounter for immunization: Secondary | ICD-10-CM | POA: Diagnosis not present

## 2020-03-23 ENCOUNTER — Telehealth: Payer: Self-pay

## 2020-03-23 ENCOUNTER — Other Ambulatory Visit: Payer: Self-pay

## 2020-03-23 ENCOUNTER — Encounter: Payer: Self-pay | Admitting: Family

## 2020-03-23 ENCOUNTER — Ambulatory Visit (INDEPENDENT_AMBULATORY_CARE_PROVIDER_SITE_OTHER): Payer: Medicare Other | Admitting: Family

## 2020-03-23 DIAGNOSIS — Z0000 Encounter for general adult medical examination without abnormal findings: Secondary | ICD-10-CM | POA: Diagnosis not present

## 2020-03-23 NOTE — Progress Notes (Addendum)
Subjective:   Jocelyn Hendrix is a 85 y.o. female who presents for Medicare Annual (Subsequent) preventive examination.  Review of Systems     Cardiac Risk Factors include: advanced age (>45men, >36 women);hypertension;diabetes mellitus     Objective:    There were no vitals filed for this visit. There is no height or weight on file to calculate BMI.  Advanced Directives 03/23/2020 02/09/2020 03/18/2019 03/13/2018 01/21/2018 06/21/2017 06/16/2017  Does Patient Have a Medical Advance Directive? Yes Yes Yes Yes Yes Yes No  Type of Estate agent of [place];Living will Healthcare Power of [place];Living will;Out of facility DNR (pink MOST or yellow form) Healthcare Power of Attorney Out of facility DNR (pink MOST or yellow form) Out of facility DNR (pink MOST or yellow form) Out of facility DNR (pink MOST or yellow form) -  Does patient want to make changes to medical advance directive? No - Patient declined No - Patient declined No - Patient declined Yes (MAU/Ambulatory/Procedural Areas - Information given) No - Patient declined - -  Copy of Healthcare Power of Attorney in Chart? Yes - validated most recent copy scanned in chart (See row information) No - copy requested No - copy requested - - - -  Would patient like information on creating a medical advance directive? - - - - - - -  Pre-existing out of facility DNR order (yellow form or pink MOST form) - - - Pink MOST form placed in chart (order not valid for inpatient use);Physician notified to receive inpatient order Pink MOST form placed in chart (order not valid for inpatient use) Pink MOST form placed in chart (order not valid for inpatient use) -    Current Medications (verified) Outpatient Encounter Medications as of 03/23/2020  Medication Sig  . ACCU-CHEK GUIDE test strip USE 1-2 TIMES DAILY AS NEEDED  . aspirin 81 MG tablet Take 81 mg by mouth daily.  . Lancets (ACCU-CHEK SOFT TOUCH) lancets 1 each by Other route  as needed for other. Use as instructed  DX E11.9  . lisinopril (ZESTRIL) 40 MG tablet TAKE 1 TABLET BY MOUTH ONCE DAILY FOR BLOOD PRESSURE  . metFORMIN (GLUCOPHAGE) 500 MG tablet TAKE 1 TABLET BY MOUTH THREE TIMES A DAY  . [DISCONTINUED] rosuvastatin (CRESTOR) 5 MG tablet Take 1 tablet (5 mg total) by mouth daily. (Patient not taking: Reported on 03/23/2020)   No facility-administered encounter medications on file as of 03/23/2020.    Allergies (verified) Patient has no known allergies.   History: Past Medical History:  Diagnosis Date  . Diabetes mellitus   . Hyperlipidemia   . Hypertension   . Myalgia and myositis, unspecified   . 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 Vitamin D deficiency    Past Surgical History:  Procedure Laterality Date  . 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 Heart failure Mother   . Diabetes Mother   . Alzheimer's disease Sister    Social History   Socioeconomic History  . Marital status: Widowed    Spouse name: Not on file  . Number of children: Not on file  . Years of education: Not on file  . Highest education level: Not on file  Occupational History  . Not on file  Tobacco Use  . Smoking status: Never Smoker  . Smokeless tobacco: Never Used  Vaping Use  . Vaping Use: Never used  Substance  and Sexual Activity  . Alcohol use: No    Alcohol/week: 0.0 standard drinks  . Drug use: No  . Sexual activity: Not Currently    Birth control/protection: Post-menopausal  Other Topics Concern  . Not on file  Social History Narrative  . Not on file   Social Determinants of Health   Financial Resource Strain:   . Difficulty of Paying Living Expenses:   Food Insecurity:   . Worried About Programme researcher, broadcasting/film/videounning Out of Food in the Last Year:   . Baristaan Out of Food in the Last Year:   Transportation Needs:   . Freight forwarderLack of Transportation (Medical):   Marland Kitchen. Lack of  Transportation (Non-Medical):   Physical Activity:   . Days of Exercise per Week:   . Minutes of Exercise per Session:   Stress:   . Feeling of Stress :   Social Connections:   . Frequency of Communication with Friends and Family:   . Frequency of Social Gatherings with Friends and Family:   . Attends Religious Services:   . Active Member of Clubs or Organizations:   . Attends BankerClub or Organization Meetings:   Marland Kitchen. Marital Status:     Tobacco Counseling Counseling given: Not Answered   Clinical Intake:  Pre-visit preparation completed: No  Pain : No/denies pain     BMI - recorded: 25.09 Nutritional Status: BMI 25 -29 Overweight Nutritional Risks: None Diabetes: Yes CBG done?: Yes (124) CBG resulted in Enter/ Edit results?: No Did pt. bring in CBG monitor from home?:  (report 110's -150's)  How often do you need to have someone help you when you read instructions, pamphlets, or other written materials from your doctor or pharmacy?: 1 - Never What is the last grade level you completed in school?: !2 grade and some college  Diabetic? Yes   Interpreter Needed?: No  Information entered by :: Jarely Juncaj FNP-C   Activities of Daily Living In your present state of health, do you have any difficulty performing the following activities: 03/23/2020  Hearing? N  Vision? Y  Comment glare follows up with Opthalmologist  Difficulty concentrating or making decisions? N  Walking or climbing stairs? N  Dressing or bathing? N  Doing errands, shopping? Y  Comment Family Ship brokerassist  Preparing Food and eating ? N  Using the Toilet? N  In the past six months, have you accidently leaked urine? N  Do you have problems with loss of bowel control? N  Managing your Medications? N  Managing your Finances? N  Housekeeping or managing your Housekeeping? N  Some recent data might be hidden    Patient Care Team: Kermit Baloeed, Tiffany L, DO as PCP - General (Geriatric Medicine) Ernesto RutherfordGroat, Robert, MD  as Consulting Physician (Ophthalmology)  Indicate any recent Medical Services you may have received from other than [facility] providers in the past year (date may be approximate).     Assessment:   This is a routine wellness examination for Alvira PhilipsGeraldine.  Hearing/Vision screen  Hearing Screening   125Hz  250Hz  500Hz  1000Hz  2000Hz  3000Hz  4000Hz  6000Hz  8000Hz   Right ear:           Left ear:           Comments: No hearing issues   Vision Screening Comments: Pending eye exam this month July 2021  Dietary issues and exercise activities discussed: Current Exercise Habits: Home exercise routine, Type of exercise: walking, Time (Minutes): 10, Frequency (Times/Week): 3, Weekly Exercise (Minutes/Week): 30, Intensity: Mild, Exercise limited by: None identified  Goals    . Maintain Lifestyle     Starting today pt will maintain lifestyle.       Depression Screen PHQ 2/9 Scores 03/23/2020 08/18/2019 04/14/2019 03/18/2019 12/12/2018 08/08/2018 07/25/2018  PHQ - 2 Score 0 0 0 0 0 0 0    Fall Risk Fall Risk  03/23/2020 02/09/2020 08/18/2019 04/14/2019 03/18/2019  Falls in the past year? 1 0 1 0 0  Number falls in past yr: 0 0 0 0 0  Comment - - - - -  Injury with Fall? 0 0 1 0 0    Any stairs in or around the home? No  If so, are there any without handrails? No  Home free of loose throw rugs in walkways, pet beds, electrical cords, etc? No  Adequate lighting in your home to reduce risk of falls? Yes   ASSISTIVE DEVICES UTILIZED TO PREVENT FALLS:  Life alert? No  Use of a cane, walker or w/c? No  Grab bars in the bathroom? yes Shower chair or bench in shower? No  Elevated toilet seat or a handicapped toilet? No   TIMED UP AND GO:  Was the test performed? No .  Length of time to ambulate 10 feet: N/A  sec.   Gait stable   Cognitive Function: MMSE - Mini Mental State Exam 03/18/2019 03/13/2018 01/17/2017 01/10/2016 08/07/2014  Orientation to time 5 5 4 5 5   Orientation to Place 5 5 5 5 5     Registration 3 3 3 3 3   Attention/ Calculation 5 5 4 5 5   Recall 2 1 0 3 2  Language- name 2 objects 2 2 2 2 2   Language- repeat 1 1 1 1 1   Language- follow 3 step command 3 3 3 3 3   Language- read & follow direction 1 1 1 1 1   Write a sentence 1 1 1 1 1   Copy design 1 1 0 1 1  Total score 29 28 24 30 29      6CIT Screen 03/23/2020  What Year? 0 points  What month? 0 points  What time? 0 points  Count back from 20 0 points  Months in reverse 4 points  Repeat phrase 4 points  Total Score 8    Immunizations Immunization History  Administered Date(s) Administered  . Moderna SARS-COVID-2 Vaccination 02/19/2020, 03/18/2020  . Tdap 11/13/2011    TDAP status: Up to date Flu Vaccine status: Declined, Education has been provided regarding the importance of this vaccine but patient still declined. Advised may receive this vaccine at local pharmacy or Health Dept. Aware to provide a copy of the vaccination record if obtained from local pharmacy or Health Dept. Verbalized acceptance and understanding. Pneumococcal vaccine status: Declined,  Education has been provided regarding the importance of this vaccine but patient still declined. Advised may receive this vaccine at local pharmacy or Health Dept. Aware to provide a copy of the vaccination record if obtained from local pharmacy or Health Dept. Verbalized acceptance and understanding.  Covid-19 vaccine status: Completed vaccines  Qualifies for Shingles Vaccine? No   Zostavax completed No   Shingrix Completed?: No.    Education has been provided regarding the importance of this vaccine. Patient has been advised to call insurance company to determine out of pocket expense if they have not yet received this vaccine. Advised may also receive vaccine at local pharmacy or Health Dept. Verbalized acceptance and understanding.  Screening Tests Health Maintenance  Topic Date Due  . OPHTHALMOLOGY EXAM  04/14/2020  .  HEMOGLOBIN A1C  08/10/2020   . FOOT EXAM  02/08/2021  . TETANUS/TDAP  11/12/2021  . DEXA SCAN  Completed  . COVID-19 Vaccine  Completed    Health Maintenance  There are no preventive care reminders to display for this patient.  Colorectal cancer screening: No longer required.  Mammogram status: No longer required.  Bone Density status: Completed 02/24/2015. Results reflect: Bone density results: OSTEOPENIA. Repeat every 2 years.  Lung Cancer Screening: (Low Dose CT Chest recommended if Age 45-80 years, 30 pack-year currently smoking OR have quit w/in 15years.) does not qualify.   Lung Cancer Screening Referral: NO   Additional Screening:  Hepatitis C Screening: does not qualify; Completed yes in [place] several years ago per patient.   Vision Screening: Recommended annual ophthalmology exams for early detection of glaucoma and other disorders of the eye. Is the patient up to date with their annual eye exam?  No  Who is the provider or what is the name of the office in which the patient attends annual eye exams? Dr.Groat  If pt is not established with a provider, would they like to be referred to a provider to establish care? No .   Dental Screening: Recommended annual dental exams for proper oral hygiene  Community Resource Referral / Chronic Care Management: CRR required this visit?  No   CCM required this visit?  No      Plan:   Declined Pneumococcal,Influenza and shingrix vaccine due to Guillian-Barre syndrome   I have personally reviewed and noted the following in the patient's chart:   . Medical and social history . Use of alcohol, tobacco or illicit drugs  . Current medications and supplements . Functional ability and status . Nutritional status . Physical activity . Advanced directives . List of other physicians . Hospitalizations, surgeries, and ER visits in previous 12 months . Vitals . Screenings to include cognitive, depression, and falls . Referrals and appointments  In  addition, I have reviewed and discussed with patient certain preventive protocols, quality metrics, and best practice recommendations. A written personalized care plan for preventive services as well as general preventive health recommendations were provided to patient.  Donalee Citrin Darren Nodal, NP   03/23/2020   Nurse Notes: Does not take influenza,Pneumnococcal or shingrix vaccine states due to Guillian-Barre syndrome   I connected with  Athena Masse on 07/20/201 by a telephone enabled telemedicine application and verified that I am speaking with the correct person using two identifiers.   I discussed the limitations of evaluation and management by telemedicine. The patient expressed understanding and agreed to proceed.

## 2020-03-23 NOTE — Patient Instructions (Signed)
[name] , Thank you for taking time to come for your Medicare Wellness Visit. I appreciate your ongoing commitment to your health goals. Please review the following plan we discussed and let me know if I can assist you in the future.   Screening recommendations/referrals: Colonoscopy: N/A  Mammogram: N/a  Bone Density : declined  Recommended yearly ophthalmology/optometry visit for glaucoma screening and checkup Recommended yearly dental visit for hygiene and checkup  Vaccinations: Influenza vaccine: Declined  Pneumococcal vaccine : Declined  Tdap vaccine : Up to date   Shingles vaccine : Declined    Advanced directives: yes   Conditions/risks identified: Advance age female > 67 yrs,Hypertension,Type DM   Next appointment: 1 year   Preventive Care 6 Years and Older, Female Preventive care refers to lifestyle choices and visits with your health care provider that can promote health and wellness. What does preventive care include?  A yearly physical exam. This is also called an annual well check.  Dental exams once or twice a year.  Routine eye exams. Ask your health care provider how often you should have your eyes checked.  Personal lifestyle choices, including:  Daily care of your teeth and gums.  Regular physical activity.  Eating a healthy diet.  Avoiding tobacco and drug use.  Limiting alcohol use.  Practicing safe sex.  Taking low-dose aspirin every day.  Taking vitamin and mineral supplements as recommended by your health care provider. What happens during an annual well check? The services and screenings done by your health care provider during your annual well check will depend on your age, overall health, lifestyle risk factors, and family history of disease. Counseling  Your health care provider may ask you questions about your:  Alcohol use.  Tobacco use.  Drug use.  Emotional well-being.  Home and relationship well-being.  Sexual  activity.  Eating habits.  History of falls.  Memory and ability to understand (cognition).  Work and work Astronomer.  Reproductive health. Screening  You may have the following tests or measurements:  Height, weight, and BMI.  Blood pressure.  Lipid and cholesterol levels. These may be checked every 5 years, or more frequently if you are over 55 years old.  Skin check.  Lung cancer screening. You may have this screening every year starting at age 2 if you have a 30-pack-year history of smoking and currently smoke or have quit within the past 15 years.  Fecal occult blood test (FOBT) of the stool. You may have this test every year starting at age 64.  Flexible sigmoidoscopy or colonoscopy. You may have a sigmoidoscopy every 5 years or a colonoscopy every 10 years starting at age 15.  Hepatitis C blood test.  Hepatitis B blood test.  Sexually transmitted disease (STD) testing.  Diabetes screening. This is done by checking your blood sugar (glucose) after you have not eaten for a while (fasting). You may have this done every 1-3 years.  Bone density scan. This is done to screen for osteoporosis. You may have this done starting at age 7.  Mammogram. This may be done every 1-2 years. Talk to your health care provider about how often you should have regular mammograms. Talk with your health care provider about your test results, treatment options, and if necessary, the need for more tests. Vaccines  Your health care provider may recommend certain vaccines, such as:  Influenza vaccine. This is recommended every year.  Tetanus, diphtheria, and acellular pertussis (Tdap, Td) vaccine. You may need a  Td booster every 10 years.  Zoster vaccine. You may need this after age 50.  Pneumococcal 13-valent conjugate (PCV13) vaccine. One dose is recommended after age 39.  Pneumococcal polysaccharide (PPSV23) vaccine. One dose is recommended after age 58. Talk to your health care  provider about which screenings and vaccines you need and how often you need them. This information is not intended to replace advice given to you by your health care provider. Make sure you discuss any questions you have with your health care provider. Document Released: 09/17/2015 Document Revised: 05/10/2016 Document Reviewed: 06/22/2015 Elsevier Interactive Patient Education  [address] Prevention in the Home Falls can cause injuries. They can happen to people of all ages. There are many things you can do to make your home safe and to help prevent falls. What can I do on the outside of my home?  Regularly fix the edges of walkways and driveways and fix any cracks.  Remove anything that might make you trip as you walk through a door, such as a raised step or threshold.  Trim any bushes or trees on the path to your home.  Use bright outdoor lighting.  Clear any walking paths of anything that might make someone trip, such as rocks or tools.  Regularly check to see if handrails are loose or broken. Make sure that both sides of any steps have handrails.  Any raised decks and porches should have guardrails on the edges.  Have any leaves, snow, or ice cleared regularly.  Use sand or salt on walking paths during winter.  Clean up any spills in your garage right away. This includes oil or grease spills. What can I do in the bathroom?  Use night lights.  Install grab bars by the toilet and in the tub and shower. Do not use towel bars as grab bars.  Use non-skid mats or decals in the tub or shower.  If you need to sit down in the shower, use a plastic, non-slip stool.  Keep the floor dry. Clean up any water that spills on the floor as soon as it happens.  Remove soap buildup in the tub or shower regularly.  Attach bath mats securely with double-sided non-slip rug tape.  Do not have throw rugs and other things on the floor that can make you trip. What can I do in  the bedroom?  Use night lights.  Make sure that you have a light by your bed that is easy to reach.  Do not use any sheets or blankets that are too big for your bed. They should not hang down onto the floor.  Have a firm chair that has side arms. You can use this for support while you get dressed.  Do not have throw rugs and other things on the floor that can make you trip. What can I do in the kitchen?  Clean up any spills right away.  Avoid walking on wet floors.  Keep items that you use a lot in easy-to-reach places.  If you need to reach something above you, use a strong step stool that has a grab bar.  Keep electrical cords out of the way.  Do not use floor polish or wax that makes floors slippery. If you must use wax, use non-skid floor wax.  Do not have throw rugs and other things on the floor that can make you trip. What can I do with my stairs?  Do not leave any items on the stairs.  Make sure that there are handrails on both sides of the stairs and use them. Fix handrails that are broken or loose. Make sure that handrails are as long as the stairways.  Check any carpeting to make sure that it is firmly attached to the stairs. Fix any carpet that is loose or worn.  Avoid having throw rugs at the top or bottom of the stairs. If you do have throw rugs, attach them to the floor with carpet tape.  Make sure that you have a light switch at the top of the stairs and the bottom of the stairs. If you do not have them, ask someone to add them for you. What else can I do to help prevent falls?  Wear shoes that:  Do not have high heels.  Have rubber bottoms.  Are comfortable and fit you well.  Are closed at the toe. Do not wear sandals.  If you use a stepladder:  Make sure that it is fully opened. Do not climb a closed stepladder.  Make sure that both sides of the stepladder are locked into place.  Ask someone to hold it for you, if possible.  Clearly mark and  make sure that you can see:  Any grab bars or handrails.  First and last steps.  Where the edge of each step is.  Use tools that help you move around (mobility aids) if they are needed. These include:  Canes.  Walkers.  Scooters.  Crutches.  Turn on the lights when you go into a dark area. Replace any light bulbs as soon as they burn out.  Set up your furniture so you have a clear path. Avoid moving your furniture around.  If any of your floors are uneven, fix them.  If there are any pets around you, be aware of where they are.  Review your medicines with your doctor. Some medicines can make you feel dizzy. This can increase your chance of falling. Ask your doctor what other things that you can do to help prevent falls. This information is not intended to replace advice given to you by your health care provider. Make sure you discuss any questions you have with your health care provider. Document Released: 06/17/2009 Document Revised: 01/27/2016 Document Reviewed: 09/25/2014 Elsevier Interactive Patient Education  2017 [employer].

## 2020-03-23 NOTE — Telephone Encounter (Signed)
I called patient to complete her AWV today with Dinah and when reviewing her medication list patient states she is not taking Crestor.   Patient took medication x 1 and stated it caused her to have a headache and she never took it again.   Patient then asked why was it prescribed, I re-informed patient of lab results dated 02/09/2020. Patient is aware of her risk and still has no interest in taking cholesterol lowering medication.  Patient aware message will be sent to Bufford Spikes L, DO as a FYI.

## 2020-03-23 NOTE — Telephone Encounter (Signed)
Ms. cinthya, bors are scheduled for a virtual visit with your provider today.    Just as we do with appointments in the office, we must obtain your consent to participate.  Your consent will be active for this visit and any virtual visit you may have with one of our providers in the next 365 days.    If you have a MyChart account, I can also send a copy of this consent to you electronically.  All virtual visits are billed to your insurance company just like a traditional visit in the office.  As this is a virtual visit, video technology does not allow for your provider to perform a traditional examination.  This may limit your provider's ability to fully assess your condition.  If your provider identifies any concerns that need to be evaluated in person or the need to arrange testing such as labs, EKG, etc, we will make arrangements to do so.    Although advances in technology are sophisticated, we cannot ensure that it will always work on either your end or our end.  If the connection with a video visit is poor, we may have to switch to a telephone visit.  With either a video or telephone visit, we are not always able to ensure that we have a secure connection.   I need to obtain your verbal consent now.   Are you willing to proceed with your visit today?   Bora Bost has provided verbal consent on 03/23/2020 for a virtual visit (video or telephone).   Edison Simon [place], [place] 03/23/2020  12:50 pm

## 2020-03-23 NOTE — Telephone Encounter (Signed)
Ms. zonnie, landen are scheduled for a virtual visit with your provider today.    Just as we do with appointments in the office, we must obtain your consent to participate.  Your consent will be active for this visit and any virtual visit you may have with one of our providers in the next 365 days.    If you have a MyChart account, I can also send a copy of this consent to you electronically.  All virtual visits are billed to your insurance company just like a traditional visit in the office.  As this is a virtual visit, video technology does not allow for your provider to perform a traditional examination.  This may limit your provider's ability to fully assess your condition.  If your provider identifies any concerns that need to be evaluated in person or the need to arrange testing such as labs, EKG, etc, we will make arrangements to do so.    Although advances in technology are sophisticated, we cannot ensure that it will always work on either your end or our end.  If the connection with a video visit is poor, we may have to switch to a telephone visit.  With either a video or telephone visit, we are not always able to ensure that we have a secure connection.   I need to obtain your verbal consent now.   Are you willing to proceed with your visit today?   Marti Mclane has provided verbal consent on 03/23/2020 for a virtual visit (video or telephone).   Dicky Doe, [place] 03/23/2020  2:51 PM

## 2020-03-23 NOTE — Progress Notes (Signed)
° °  This service is provided via telemedicine  No vital signs collected/recorded due to the encounter was a telemedicine visit.   Location of patient (ex: home, work):  Home  Patient consents to a telephone visit:    Location of the provider (ex: office, home):  [facility], Office   Name of any referring provider:  Kermit Balo, DO  Names of all persons participating in the telemedicine service and their role in the encounter:  Dinah Ngetich, NP, Chrae Nancie Bocanegra/CMA, and Patient  Time spent on call:  9 min with medical assistant

## 2020-04-29 ENCOUNTER — Other Ambulatory Visit: Payer: Self-pay | Admitting: Internal Medicine

## 2020-05-24 DIAGNOSIS — H2513 Age-related nuclear cataract, bilateral: Secondary | ICD-10-CM | POA: Diagnosis not present

## 2020-05-24 DIAGNOSIS — H43813 Vitreous degeneration, bilateral: Secondary | ICD-10-CM | POA: Diagnosis not present

## 2020-05-24 DIAGNOSIS — E119 Type 2 diabetes mellitus without complications: Secondary | ICD-10-CM | POA: Diagnosis not present

## 2020-05-24 LAB — HM DIABETES EYE EXAM

## 2020-05-25 ENCOUNTER — Encounter: Payer: Self-pay | Admitting: Internal Medicine

## 2020-09-13 ENCOUNTER — Other Ambulatory Visit: Payer: Self-pay

## 2020-09-13 ENCOUNTER — Ambulatory Visit (INDEPENDENT_AMBULATORY_CARE_PROVIDER_SITE_OTHER): Payer: Medicare Other | Admitting: Family

## 2020-09-13 ENCOUNTER — Encounter: Payer: Self-pay | Admitting: Family

## 2020-09-13 VITALS — BP 138/84 | HR 100 | Temp 97.5°F | Resp 16 | Ht 65.0 in | Wt 135.8 lb

## 2020-09-13 DIAGNOSIS — R000 Tachycardia, unspecified: Secondary | ICD-10-CM | POA: Diagnosis not present

## 2020-09-13 DIAGNOSIS — I10 Essential (primary) hypertension: Secondary | ICD-10-CM | POA: Diagnosis not present

## 2020-09-13 DIAGNOSIS — R399 Unspecified symptoms and signs involving the genitourinary system: Secondary | ICD-10-CM | POA: Diagnosis not present

## 2020-09-13 LAB — POCT URINALYSIS DIPSTICK
Blood, UA: POSITIVE
Glucose, UA: NEGATIVE
Ketones, UA: POSITIVE
Nitrite, UA: POSITIVE
Protein, UA: POSITIVE — AB
Spec Grav, UA: >=1.03 — AB (ref 1.010–1.025)
Urobilinogen, UA: NEGATIVE E.U./dL — AB
pH, UA: 6 (ref 5.0–8.0)

## 2020-09-13 MED ORDER — CRANBERRY 475 MG PO CAPS: 475.0000 mg | ORAL_CAPSULE | Freq: Two times a day (BID) | ORAL | 1 refills | Status: AC
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
## 2020-09-13 NOTE — Patient Instructions (Signed)
- Increase water intake to 6-8 glasses daily - Urine culture send to lab will call you in 3 days with result. - Notify provider if symptoms worsen or running any fever or chills.    Obstetrics: Normal and problem pregnancies (7th ed., pp. [phone]). [place], [place]: Elsevier."> [facility] (12th ed., pp. [phone]). [place], [place]: Elsevier.">  Asymptomatic Bacteriuria Asymptomatic bacteriuria is the presence of a large number of bacteria in the urine without the usual symptoms of burning or frequent urination. This is usually not harmful, and treatment may not be needed. A person with this condition will not be more likely to develop an infection in the future. What are the causes? This condition is caused by an increase in bacteria in the urine. This increase can be caused by:  Bacteria entering the urinary tract, such as during sex.  A blockage in the urinary tract, such as from kidney stones or a tumor.  Bladder problems that prevent the bladder from emptying.   What increases the risk? You are more likely to develop this condition if:  You have diabetes.  You are an older adult. This especially affects older adults in long-term care facilities.  You are pregnant and in the first trimester.  You have kidney stones.  You are female.  You have had a kidney transplant.  You have a leaky kidney tube valve (reflux).  You had a urinary catheter for a long period of time. This is a long, thin tube that collects urine. What are the signs or symptoms? There are no symptoms of this condition. How is this diagnosed? This condition is diagnosed with a urine test. Because this condition does not cause symptoms, it is usually diagnosed when a urine sample is taken to treat or diagnose another condition, such as pregnancy or kidney problems. Most women who are in their first trimester of pregnancy are screened for asymptomatic bacteriuria. How is this treated? Usually,  treatment is not needed for this condition. Treating the condition can lead to other problems, such as a yeast infection or the growth of bacteria that do not respond to treatment (antibiotic-resistant bacteria). Some people do need treatment with antibiotic medicines to prevent kidney infection, known as pyelonephritis. Treatment is needed if:  You are pregnant. In pregnant women, kidney infection can lead to: ? Early labor (premature labor). ? Very low birth weight (fetal growth restriction). ? Newborn death.  You are having a procedure that affects the urinary tract.  You have had a kidney transplant. If you are diagnosed with this condition, talk with your health care provider about any concerns that you have. Follow these instructions at home: Medicines  Take over-the-counter and prescription medicines only as told by your health care provider.  If you were prescribed an antibiotic medicine, take it as told by your health care provider. Do not stop using the antibiotic even if you start to feel better. General instructions  Monitor your condition for any changes.  Drink enough fluid to keep your urine pale yellow.  Urinate more often to keep your bladder empty.  If you are female, keep the area around your vagina and rectum clean. Wipe from front to back after urinating or having a bowel movement. Use each piece of toilet paper only once.  Keep all follow-up visits. This is important. Contact a health care provider if:  You have symptoms of a urine infection, such as: ? A burning sensation, or pain when you urinate. ? A strong need to urinate,  or urinating more often. ? Urine turning discolored or cloudy. ? Blood in your urine. ? Urine that smells bad. Get help right away if:  You develop signs of a kidney infection, such as: ? Back pain or pelvic pain. ? A fever or chills. ? Nausea or vomiting. ? Severe pain that cannot be controlled with  medicine. Summary  Asymptomatic bacteriuria is the presence of a large number of bacteria in the urine without the usual symptoms of burning or frequent urination.  Usually, treatment is not needed for this condition. Treating the condition can lead to other problems, such as a yeast infection or the growth of bacteria that do not respond to treatment.  Some people do need treatment. Treatment is needed if you are pregnant, if you are having a procedure that affects the urinary tract, or if you have had a kidney transplant.  If you were prescribed an antibiotic medicine, take it as told by your health care provider. Do not stop using the antibiotic even if you start to feel better. This information is not intended to replace advice given to you by your health care provider. Make sure you discuss any questions you have with your health care provider. Document Revised: 04/02/2020 Document Reviewed: 04/02/2020 Elsevier Patient Education  2021 [employer].

## 2020-09-15 ENCOUNTER — Other Ambulatory Visit: Payer: Self-pay

## 2020-09-15 LAB — URINE CULTURE
MICRO NUMBER:: 11401309
Result:: NO GROWTH
SPECIMEN QUALITY:: ADEQUATE

## 2020-09-15 MED ORDER — METFORMIN HCL 500 MG PO TABS: 500.0000 mg | ORAL_TABLET | Freq: Three times a day (TID) | ORAL | 1 refills | Status: DC

## 2020-09-15 MED ORDER — LISINOPRIL 40 MG PO TABS: ORAL_TABLET | ORAL | 1 refills | Status: DC

## 2020-09-15 NOTE — Telephone Encounter (Signed)
Patient would now like to start using [facility] at [facility] as it is closer to her. Patient states she will leave [facility] as a back up pharmacy on her account.  Patient asked that I send RX's to [facility] and note, HOLD until she requests

## 2020-09-16 ENCOUNTER — Telehealth: Payer: Self-pay | Admitting: *Deleted

## 2020-09-16 NOTE — Telephone Encounter (Signed)
Patient daughter, Lessie Dings, called requesting patient's Urine Culture that was done on 09/13/20.   Not showing a DPR in patient's chart to release information to daughter.  I called patient and spoke with her and she gave me VERBAL CONSENT to speak with Lessie Dings, daughter, Anytime. (will sign DPR next time in office)  I called daughter back and results given. Daughter was concerned that patient still having shaking and I offered an appointment. (also offered appointment with patient when she was on phone but she refused stating that she thinks it is coming from change in [place] of medication.)      Maurice Small, CMA  09/15/2020 1:50 PM EST Back to Top     Discussed results with patient, patient verbalized understanding of results   Caesar Bookman, NP  09/15/2020 8:58 AM EST      Final urine culture shows no signs of infection.

## 2020-09-21 ENCOUNTER — Other Ambulatory Visit: Payer: Self-pay

## 2020-09-21 ENCOUNTER — Encounter (HOSPITAL_COMMUNITY): Payer: Self-pay | Admitting: *Deleted

## 2020-09-21 ENCOUNTER — Emergency Department (HOSPITAL_COMMUNITY)
Admission: EM | Admit: 2020-09-21 | Discharge: 2020-09-22 | Disposition: A | Discharge status: Home / Self Care | Payer: Medicare Other | Attending: Emergency Medicine | Admitting: Emergency Medicine

## 2020-09-21 DIAGNOSIS — R4781 Slurred speech: Secondary | ICD-10-CM | POA: Insufficient documentation

## 2020-09-21 DIAGNOSIS — K068 Other specified disorders of gingiva and edentulous alveolar ridge: Secondary | ICD-10-CM | POA: Diagnosis not present

## 2020-09-21 DIAGNOSIS — R413 Other amnesia: Secondary | ICD-10-CM | POA: Insufficient documentation

## 2020-09-21 DIAGNOSIS — R251 Tremor, unspecified: Secondary | ICD-10-CM | POA: Diagnosis not present

## 2020-09-21 DIAGNOSIS — Z5321 Procedure and treatment not carried out due to patient leaving prior to being seen by health care provider: Secondary | ICD-10-CM | POA: Diagnosis not present

## 2020-09-21 LAB — CBC
HCT: 37.5 % (ref 36.0–46.0)
Hemoglobin: 12.1 g/dL (ref 12.0–15.0)
MCH: 29.6 pg (ref 26.0–34.0)
MCHC: 32.3 g/dL (ref 30.0–36.0)
MCV: 91.7 fL (ref 80.0–100.0)
Platelets: 130 10*3/uL — ABNORMAL LOW (ref 150–400)
RBC: 4.09 MIL/uL (ref 3.87–5.11)
RDW: 12.6 % (ref 11.5–15.5)
WBC: 6.9 10*3/uL (ref 4.0–10.5)
nRBC: 0 % (ref 0.0–0.2)

## 2020-09-21 LAB — BASIC METABOLIC PANEL
Anion gap: 8 (ref 5–15)
BUN: 11 mg/dL (ref 8–23)
CO2: 24 mmol/L (ref 22–32)
Calcium: 9.5 mg/dL (ref 8.9–10.3)
Chloride: 95 mmol/L — ABNORMAL LOW (ref 98–111)
Creatinine, Ser: 0.89 mg/dL (ref 0.44–1.00)
GFR, Estimated: >60 mL/min (ref >60)
Glucose, Bld: 165 mg/dL — ABNORMAL HIGH (ref 70–99)
Potassium: 3.6 mmol/L (ref 3.5–5.1)
Sodium: 127 mmol/L — ABNORMAL LOW (ref 135–145)

## 2020-09-21 NOTE — ED Triage Notes (Signed)
Pt arrives with her daughter with c/o trembling, slurring of her speech and memory loss on and off for 4 days. Daughter says her gait has also been unsteady. Reports dental bleeding.

## 2020-09-22 ENCOUNTER — Ambulatory Visit (INDEPENDENT_AMBULATORY_CARE_PROVIDER_SITE_OTHER): Payer: Medicare Other | Admitting: Family

## 2020-09-22 ENCOUNTER — Encounter: Payer: Self-pay | Admitting: Family

## 2020-09-22 VITALS — BP 150/86 | HR 85 | Temp 97.5°F | Resp 16 | Ht 65.0 in | Wt 135.8 lb

## 2020-09-22 DIAGNOSIS — R410 Disorientation, unspecified: Secondary | ICD-10-CM

## 2020-09-22 DIAGNOSIS — E871 Hypo-osmolality and hyponatremia: Secondary | ICD-10-CM

## 2020-09-22 DIAGNOSIS — R399 Unspecified symptoms and signs involving the genitourinary system: Secondary | ICD-10-CM | POA: Diagnosis not present

## 2020-09-22 LAB — POCT URINALYSIS DIPSTICK
Bilirubin, UA: NEGATIVE
Blood, UA: NEGATIVE
Glucose, UA: NEGATIVE
Nitrite, UA: NEGATIVE
Protein, UA: POSITIVE — AB
Spec Grav, UA: <=1.005 — AB (ref 1.010–1.025)
Urobilinogen, UA: NEGATIVE E.U./dL — AB
pH, UA: 6 (ref 5.0–8.0)

## 2020-09-22 MED ORDER — SODIUM CHLORIDE 1 G PO TABS: 1.0000 g | ORAL_TABLET | Freq: Every day | ORAL | 0 refills | Status: DC

## 2020-09-22 MED ORDER — SODIUM CHLORIDE 1 G PO TABS: 1.0000 g | ORAL_TABLET | Freq: Once | ORAL | 0 refills | Status: DC

## 2020-09-22 NOTE — ED Notes (Signed)
Called for vitals recheck x2 with no response

## 2020-09-22 NOTE — Progress Notes (Signed)
Provider: Kaiel Weide FNP-C  Gayland Curry, DO  Patient Care Team: Gayland Curry, DO as PCP - General (Geriatric Medicine) Clent Jacks, MD as Consulting Physician (Ophthalmology)  Extended Emergency Contact Information Primary Emergency Contact: [place] Mobile Phone: [phone] Relation: Daughter Secondary Emergency Contact: [place] Mobile Phone: [phone] Relation: Daughter  Code Status:  Full Code  Goals of care: Advanced Directive information Advanced Directives 09/22/2020  Does Patient Have a Medical Advance Directive? Yes  Type of Advance Directive Living will;Healthcare Power of [place];Out of facility DNR (pink MOST or yellow form)  Does patient want to make changes to medical advance directive? No - Patient declined  Copy of [place] in Chart? No - copy requested  Would patient like information on creating a medical advance directive? -  Pre-existing out of facility DNR order (yellow form or pink MOST form) -     Chief Complaint  Patient presents with  . Hospitalization Follow-up    Patient had abnormal hospital labs. Disoriented and mouth still bleeding on/off.    HPI:  Pt is a 86 y.o. female seen today for an acute visit for follow up ED visit.she is here with her daughter.she states was in the ED last night for disorientation.she states left before being seen due long waiting time.On chart review, She was seen in the ED 09/21/2020 had lab work done no Ed noted..Her labs shows Na 127,Cl 95,Glucose 165,WBC 6.9 and PLts low 130.States had eaten when lab work was done. Daughter states has not been taking her medication for the past several days but her family has been ensuring she takes  Since last night.she took he medication this morning.Her blood pressure was high at the ED but not recorded. She states feeling much better today no disorientation noted.     Past Medical History:  Diagnosis Date   . Diabetes mellitus   . Hyperlipidemia   . Hypertension   . Myalgia and myositis, unspecified   . 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 Vitamin D deficiency    Past Surgical History:  Procedure Laterality Date  . [place] placed for Guillain-Barre due to poisoning    No Known Allergies  Outpatient Encounter Medications as of 09/22/2020  Medication Sig  . ACCU-CHEK GUIDE test strip USE 1-2 TIMES DAILY AS NEEDED  . aspirin 81 MG tablet Take 81 mg by mouth daily.  . Cranberry 475 MG CAPS Take 1 capsule (475 mg total) by mouth 2 (two) times daily.  . Lancets (ACCU-CHEK SOFT TOUCH) lancets 1 each by Other route as needed for other. Use as instructed  DX E11.9  . lisinopril (ZESTRIL) 40 MG tablet TAKE 1 TABLET BY MOUTH ONCE DAILY FOR BLOOD PRESSURE  . metFORMIN (GLUCOPHAGE) 500 MG tablet Take 1 tablet (500 mg total) by mouth 3 (three) times daily.   No facility-administered encounter medications on file as of 09/22/2020.    Review of Systems  Constitutional: Negative for appetite change, chills, fatigue and fever.  HENT: Negative for congestion, rhinorrhea, sinus pressure, sinus pain, sneezing and sore throat.   Eyes: Negative for discharge, redness and itching.  Respiratory: Negative for cough, chest tightness, shortness of breath and wheezing.   Cardiovascular: Negative for chest pain, palpitations and leg swelling.  Gastrointestinal: Negative for abdominal distention, abdominal pain, constipation, diarrhea, nausea and vomiting.  Endocrine: Negative for cold intolerance, heat intolerance, polydipsia, polyphagia and polyuria.  Genitourinary: Negative for difficulty urinating,  dysuria, flank pain, frequency and urgency.  Musculoskeletal: Positive for gait problem. Negative for joint swelling and myalgias.  Skin: Negative for color change, pallor and rash.  Neurological: Negative for dizziness, speech difficulty, weakness, light-headedness, numbness  and headaches.  Hematological: Does not bruise/bleed easily.  Psychiatric/Behavioral: Negative for agitation, behavioral problems and sleep disturbance. The patient is not nervous/anxious.     Immunization History  Administered Date(s) Administered  . Moderna Sars-Covid-2 Vaccination 02/19/2020, 03/18/2020  . Tdap 11/13/2011   Pertinent  Health Maintenance Due  Topic Date Due  . HEMOGLOBIN A1C  08/10/2020  . FOOT EXAM  02/08/2021  . OPHTHALMOLOGY EXAM  05/24/2021  . DEXA SCAN  Completed   Fall Risk  09/22/2020 09/13/2020 03/23/2020 02/09/2020 08/18/2019  Falls in the past year? 0 0 1 0 1  Number falls in past yr: 0 0 0 0 0  Comment - - - - -  Injury with Fall? 0 0 0 0 1   Functional Status Survey:    Vitals:   09/22/20 1100  BP: (!) 150/86  Pulse: 85  Resp: 16  Temp: (!) 97.5 F (36.4 C)  SpO2: 99%  Weight: 135 lb 12.8 oz (61.6 kg)  Height: 5' 5"  (1.651 m)   Body mass index is 22.6 kg/m. Physical Exam Vitals reviewed.  Constitutional:      General: She is not in acute distress.    Appearance: She is not ill-appearing.  HENT:     Head: Normocephalic.     Nose: Nose normal. No congestion or rhinorrhea.     Mouth/Throat:     Mouth: Mucous membranes are moist.     Pharynx: Oropharynx is clear. No oropharyngeal exudate or posterior oropharyngeal erythema.  Eyes:     General: No scleral icterus.       Right eye: No discharge.        Left eye: No discharge.     Conjunctiva/sclera: Conjunctivae normal.     Pupils: Pupils are equal, round, and reactive to light.  Neck:     Vascular: No carotid bruit.  Cardiovascular:     Rate and Rhythm: Normal rate and regular rhythm.     Pulses: Normal pulses.     Heart sounds: Normal heart sounds. No murmur heard. No friction rub. No gallop.   Pulmonary:     Effort: Pulmonary effort is normal. No respiratory distress.     Breath sounds: Normal breath sounds. No wheezing, rhonchi or rales.  Chest:     Chest wall: No tenderness.   Abdominal:     General: Bowel sounds are normal. There is no distension.     Palpations: Abdomen is soft. There is no mass.     Tenderness: There is no abdominal tenderness. There is no right CVA tenderness, left CVA tenderness, guarding or rebound.  Musculoskeletal:        General: No swelling or tenderness.     Cervical back: Normal range of motion. No rigidity or tenderness.     Right lower leg: No edema.     Left lower leg: No edema.     Comments: Unsteady gait walks with a cane   Lymphadenopathy:     Cervical: No cervical adenopathy.  Skin:    General: Skin is warm and dry.     Coloration: Skin is not pale.     Findings: No bruising, erythema, lesion or rash.  Neurological:     Mental Status: She is alert and oriented to person, place, and time.  Cranial Nerves: No cranial nerve deficit.     Sensory: No sensory deficit.     Motor: No weakness.     Coordination: Coordination normal.     Gait: Gait abnormal.  Psychiatric:        Mood and Affect: Mood normal.        Behavior: Behavior normal.        Thought Content: Thought content normal.        Judgment: Judgment normal.     Labs reviewed: Recent Labs    02/09/20 0938 02/09/20 0945 09/21/20 2001  NA  --  137 127*  K  --  4.6 3.6  CL  --  104 95*  CO2  --  27 24  GLUCOSE  --  98 165*  BUN  --  20 11  CREATININE  --  0.90* 0.89  CALCIUM  --  10.1 9.5  MG 2.1  --   --    No results for input(s): AST, ALT, ALKPHOS, BILITOT, PROT, ALBUMIN in the last 8760 hours. Recent Labs    09/21/20 2001  WBC 6.9  HGB 12.1  HCT 37.5  MCV 91.7  PLT 130*   Lab Results  Component Value Date   TSH 2.88 05/30/2016   Lab Results  Component Value Date   HGBA1C 6.5 (H) 02/09/2020   Lab Results  Component Value Date   CHOL 209 (H) 02/09/2020   HDL 52 02/09/2020   LDLCALC 131 (H) 02/09/2020   LDLDIRECT 75 09/26/2016   TRIG 148 02/09/2020   CHOLHDL 4.0 02/09/2020    Significant Diagnostic Results in last 30 days:   No results found.  Assessment/Plan 1. Hyponatremia Na 127 ( 09/21/2020 ) at the ED but left prior to being treated.Had disorientation but none today. - Advised to limit water intake to 6-8 glasses daily  - CMP with eGFR([facility]) - sodium chloride 1 g tablet; Take 1 tablet (1 g total) by mouth daily for 30 doses.  Dispense: 30 tablet; Refill: 0  2. Disorientation Per daughter was disoriented yesterday but not today.suspect due to hyponatremia.No s/sx of URI's.  Will rule out any acute and metabolic abnormalities. - CMP with eGFR([facility]) - CBC with Differential/Platelet - Urine Culture - sodium chloride 1 g tablet; Take 1 tablet (1 g total) by mouth daily for 30 doses.  Dispense: 30 tablet; Refill: 0  3. Symptoms of urinary tract infection Afebrile.Had disorientation and was not taking her medication but family now assist.  - POC Urinalysis Dipstick shows clear yellow urine positive for Ketones,proteins and Leukocytes.will send for culture .made aware culture takes up to 3 days.will notify provider if running any fever or symptoms worsen.  - Urine Culture  Family/ staff Communication: Reviewed plan of care with patient and daughter verbalized understanding.   Labs/tests ordered:  - CMP with eGFR([facility]) - CBC with Differential/Platelet - Urine Culture   Next Appointment: 2 weeks for evaluation of hyponatremia will repeat BMP during visit.   Sandrea Hughs, NP

## 2020-09-22 NOTE — Patient Instructions (Addendum)
-   Drink water 6-8 glasses daily only - Notify provider or go to ED  for any signs of bleeding   - Please Take your medication as directed   Hyponatremia Hyponatremia is when the amount of salt (sodium) in your blood is too low. When salt levels are low, your body may take in extra water. This can cause swelling throughout the body. The swelling often affects the brain. What are the causes? This condition may be caused by:  Certain medical problems or conditions.  Vomiting a lot.  Having watery poop (diarrhea) often.  Certain medicines or illegal drugs.  Not having enough water in the body (dehydration).  Drinking too much water.  Eating a diet that is low in salt.  Large burns on your body.  Too much sweating. What increases the risk? You are more likely to get this condition if you:  Have long-term (chronic) kidney disease.  Have heart failure.  Have a medical condition that causes you to have watery poop often.  Do very hard exercises.  Take medicines that affect the amount of salt is in your blood. What are the signs or symptoms? Symptoms of this condition include:  Headache.  Feeling like you may vomit (nausea).  Vomiting.  Being very tired (lethargic).  Muscle weakness and cramps.  Not wanting to eat as much as normal (loss of appetite).  Feeling weak or light-headed. Severe symptoms of this condition include:  Confusion.  Feeling restless (agitation).  Having a fast heart rate.  Passing out (fainting).  Seizures.  Coma. How is this treated? Treatment for this condition depends on the cause. Treatment may include:  Getting fluids through an IV tube that is put into one of your veins.  Taking medicines to fix the salt levels in your blood. If medicines are causing the problem, your medicines will need to be changed.  Limiting how much water or fluid you take in.  Monitoring in the hospital to watch your symptoms.   Follow these  instructions at home:  Take over-the-counter and prescription medicines only as told by your doctor. Many medicines can make this condition worse. Talk with your doctor about any medicines that you are taking.  Eat and drink exactly as you are told by your doctor. ? Eat only the foods you are told to eat. ? Limit how much fluid you take.  Do not drink alcohol.  Keep all follow-up visits as told by your doctor. This is important.   Contact a doctor if:  You feel more like you may vomit.  You feel more tired.  Your headache gets worse.  You feel more confused.  You feel weaker.  Your symptoms go away and then they come back.  You have trouble following the diet instructions. Get help right away if:  You have a seizure.  You pass out.  You keep having watery poop.  You keep vomiting. Summary  Hyponatremia is when the amount of salt in your blood is too low.  When salt levels are low, you can have swelling throughout the body. The swelling mostly affects the brain.  Treatment depends on the cause. Treatment may include getting IV fluids, medicines, or not drinking as much fluid. This information is not intended to replace advice given to you by your health care provider. Make sure you discuss any questions you have with your health care provider. Document Revised: 11/07/2018 Document Reviewed: 07/25/2018 Elsevier Patient Education  2021 [employer].

## 2020-09-22 NOTE — ED Notes (Signed)
Patient called x3 with no resposne

## 2020-09-23 ENCOUNTER — Ambulatory Visit: Payer: Medicare Other | Admitting: Internal Medicine

## 2020-09-23 LAB — URINE CULTURE
MICRO NUMBER:: 11435501
Result:: NO GROWTH
SPECIMEN QUALITY:: ADEQUATE

## 2020-09-23 LAB — CBC WITH DIFFERENTIAL/PLATELET
Absolute Monocytes: 827 cells/uL (ref 200–950)
Basophils Absolute: 78 cells/uL (ref 0–200)
Basophils Relative: 1 %
Eosinophils Absolute: 148 cells/uL (ref 15–500)
Eosinophils Relative: 1.9 %
HCT: 37.3 % (ref 35.0–45.0)
Hemoglobin: 12.7 g/dL (ref 11.7–15.5)
Lymphs Abs: 2621 cells/uL (ref 850–3900)
MCH: 29.7 pg (ref 27.0–33.0)
MCHC: 34 g/dL (ref 32.0–36.0)
MCV: 87.1 fL (ref 80.0–100.0)
MPV: 14.5 fL — ABNORMAL HIGH (ref 7.5–12.5)
Monocytes Relative: 10.6 %
Neutro Abs: 4126 cells/uL (ref 1500–7800)
Neutrophils Relative %: 52.9 %
Platelets: 143 10*3/uL (ref 140–400)
RBC: 4.28 10*6/uL (ref 3.80–5.10)
RDW: 12.8 % (ref 11.0–15.0)
Total Lymphocyte: 33.6 %
WBC: 7.8 10*3/uL (ref 3.8–10.8)

## 2020-09-23 LAB — COMPLETE METABOLIC PANEL WITH GFR
AG Ratio: 1.3 (calc) (ref 1.0–2.5)
ALT: 12 U/L (ref 6–29)
AST: 29 U/L (ref 10–35)
Albumin: 4.4 g/dL (ref 3.6–5.1)
Alkaline phosphatase (APISO): 55 U/L (ref 37–153)
BUN/Creatinine Ratio: 12 (calc) (ref 6–22)
BUN: 11 mg/dL (ref 7–25)
CO2: 26 mmol/L (ref 20–32)
Calcium: 10.2 mg/dL (ref 8.6–10.4)
Chloride: 96 mmol/L — ABNORMAL LOW (ref 98–110)
Creat: 0.9 mg/dL — ABNORMAL HIGH (ref 0.60–0.88)
GFR, Est African American: 69 mL/min/{1.73_m2} (ref >=60)
GFR, Est Non African American: 59 mL/min/{1.73_m2} — ABNORMAL LOW (ref >=60)
Globulin: 3.3 g/dL (calc) (ref 1.9–3.7)
Glucose, Bld: 127 mg/dL (ref 65–139)
Potassium: 4.2 mmol/L (ref 3.5–5.3)
Sodium: 131 mmol/L — ABNORMAL LOW (ref 135–146)
Total Bilirubin: 0.4 mg/dL (ref 0.2–1.2)
Total Protein: 7.7 g/dL (ref 6.1–8.1)

## 2020-09-25 ENCOUNTER — Emergency Department (HOSPITAL_COMMUNITY)
Admission: EM | Admit: 2020-09-25 | Discharge: 2020-09-25 | Disposition: A | Discharge status: Home / Self Care | Payer: Medicare Other | Attending: Emergency Medicine | Admitting: Emergency Medicine

## 2020-09-25 ENCOUNTER — Other Ambulatory Visit: Payer: Self-pay

## 2020-09-25 ENCOUNTER — Encounter (HOSPITAL_COMMUNITY): Payer: Self-pay | Admitting: *Deleted

## 2020-09-25 DIAGNOSIS — Z79899 Other long term (current) drug therapy: Secondary | ICD-10-CM | POA: Diagnosis not present

## 2020-09-25 DIAGNOSIS — Z7984 Long term (current) use of oral hypoglycemic drugs: Secondary | ICD-10-CM | POA: Diagnosis not present

## 2020-09-25 DIAGNOSIS — Z7982 Long term (current) use of aspirin: Secondary | ICD-10-CM | POA: Diagnosis not present

## 2020-09-25 DIAGNOSIS — E119 Type 2 diabetes mellitus without complications: Secondary | ICD-10-CM | POA: Diagnosis not present

## 2020-09-25 DIAGNOSIS — I10 Essential (primary) hypertension: Secondary | ICD-10-CM | POA: Diagnosis not present

## 2020-09-25 DIAGNOSIS — R251 Tremor, unspecified: Secondary | ICD-10-CM | POA: Diagnosis not present

## 2020-09-25 LAB — CBC WITH DIFFERENTIAL/PLATELET
Abs Immature Granulocytes: 0.02 10*3/uL (ref 0.00–0.07)
Basophils Absolute: 0.1 10*3/uL (ref 0.0–0.1)
Basophils Relative: 1 %
Eosinophils Absolute: 0.1 10*3/uL (ref 0.0–0.5)
Eosinophils Relative: 2 %
HCT: 39.1 % (ref 36.0–46.0)
Hemoglobin: 12.6 g/dL (ref 12.0–15.0)
Immature Granulocytes: 0 %
Lymphocytes Relative: 37 %
Lymphs Abs: 2.4 10*3/uL (ref 0.7–4.0)
MCH: 29.4 pg (ref 26.0–34.0)
MCHC: 32.2 g/dL (ref 30.0–36.0)
MCV: 91.1 fL (ref 80.0–100.0)
Monocytes Absolute: 0.7 10*3/uL (ref 0.1–1.0)
Monocytes Relative: 11 %
Neutro Abs: 3.2 10*3/uL (ref 1.7–7.7)
Neutrophils Relative %: 49 %
Platelets: 112 10*3/uL — ABNORMAL LOW (ref 150–400)
RBC: 4.29 MIL/uL (ref 3.87–5.11)
RDW: 13.1 % (ref 11.5–15.5)
WBC: 6.6 10*3/uL (ref 4.0–10.5)
nRBC: 0 % (ref 0.0–0.2)

## 2020-09-25 LAB — URINALYSIS, ROUTINE W REFLEX MICROSCOPIC
Bilirubin Urine: NEGATIVE
Glucose, UA: NEGATIVE mg/dL
Hgb urine dipstick: NEGATIVE
Ketones, ur: 20 mg/dL — AB
Nitrite: NEGATIVE
Protein, ur: NEGATIVE mg/dL
Specific Gravity, Urine: 1.013 (ref 1.005–1.030)
pH: 5 (ref 5.0–8.0)

## 2020-09-25 LAB — BASIC METABOLIC PANEL
Anion gap: 11 (ref 5–15)
BUN: 13 mg/dL (ref 8–23)
CO2: 23 mmol/L (ref 22–32)
Calcium: 9.5 mg/dL (ref 8.9–10.3)
Chloride: 101 mmol/L (ref 98–111)
Creatinine, Ser: 1 mg/dL (ref 0.44–1.00)
GFR, Estimated: 56 mL/min — ABNORMAL LOW (ref >60)
Glucose, Bld: 119 mg/dL — ABNORMAL HIGH (ref 70–99)
Potassium: 4.3 mmol/L (ref 3.5–5.1)
Sodium: 135 mmol/L (ref 135–145)

## 2020-09-25 LAB — CBG MONITORING, ED: Glucose-Capillary: 153 mg/dL — ABNORMAL HIGH (ref 70–99)

## 2020-09-25 NOTE — ED Triage Notes (Signed)
Patient states she is having tremors for a long time the difference today is she almost fell. Patient states she was seen here in the ED for same.

## 2020-09-25 NOTE — Discharge Instructions (Addendum)
Call your primary care doctor or specialist as discussed in the next 2-3 days.   Return immediately back to the ER if:  Your symptoms worsen within the next 12-24 hours. You develop new symptoms such as new fevers, persistent vomiting, new pain, shortness of breath, or new weakness or numbness, or if you have any other concerns.  

## 2020-09-25 NOTE — ED Provider Notes (Signed)
[facility] EMERGENCY DEPARTMENT Provider Note   CSN: [id number] Arrival date & time: 09/25/20  3646     History Chief Complaint  Patient presents with  . Shaking    Jocelyn Hendrix is a 86 y.o. female.  Patient presents chief complaint of intermittent generalized tremors.  She states has had them for "a long time.  "Describes them as intermittent.  This time she felt it was more significant than prior episodes describes as generalized shaking that lasted a few minutes.  Currently appears to have resolved.  She states she has no pain or discomfort.  No recent fevers no cough no vomiting no diarrhea.  She was seen here a few days ago but left without being seen.  Had lab work then that showed some mild hyponatremia.  She saw her primary care doctor's team and was given sodium tablets which she has been taking.  Denies headache or chest pain or abdominal pain.        Past Medical History:  Diagnosis Date  . Diabetes mellitus   . Hyperlipidemia   . Hypertension   . Myalgia and myositis, unspecified   . 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 Vitamin D deficiency     Patient Active Problem List   Diagnosis Date Noted  . Diverticulosis 12/12/2018  . History of Guillain-Barre syndrome 06/01/2016  . Diverticulitis 11/06/2013  . Type 2 diabetes mellitus with other specified complication (HCC) 12/05/2012  . Hyperlipidemia associated with type 2 diabetes mellitus (HCC) 12/05/2012  . Essential hypertension, benign 12/05/2012    Past Surgical History:  Procedure Laterality Date  . 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     Family History  Problem Relation Age of Onset  . Heart failure Mother   . Diabetes Mother   . Alzheimer's disease Sister     Social History   Tobacco Use  . Smoking status: Never Smoker  . Smokeless tobacco: Never Used  Vaping Use  .  Vaping Use: Never used  Substance Use Topics  . Alcohol use: No    Alcohol/week: 0.0 standard drinks  . Drug use: No    Home Medications Prior to Admission medications   Medication Sig Start Date End Date Taking? Authorizing Provider  ACCU-CHEK GUIDE test strip USE 1-2 TIMES DAILY AS NEEDED 02/04/20   Reed, Tiffany L, DO  aspirin 81 MG tablet Take 81 mg by mouth daily.    [provider]  Cranberry 475 MG CAPS Take 1 capsule (475 mg total) by mouth 2 (two) times daily. 09/13/20 10/13/20  Ngetich, Dinah C, NP  Lancets (ACCU-CHEK SOFT TOUCH) lancets 1 each by Other route as needed for other. Use as instructed  DX E11.9 03/18/19   Ngetich, Dinah C, NP  lisinopril (ZESTRIL) 40 MG tablet TAKE 1 TABLET BY MOUTH ONCE DAILY FOR BLOOD PRESSURE 09/15/20   Reed, Tiffany L, DO  metFORMIN (GLUCOPHAGE) 500 MG tablet Take 1 tablet (500 mg total) by mouth 3 (three) times daily. 09/15/20   Reed, Tiffany L, DO  sodium chloride 1 g tablet Take 1 tablet (1 g total) by mouth daily for 30 doses. 09/22/20 10/22/20  Ngetich, Donalee Citrin, NP    Allergies    Patient has no known allergies.  Review of Systems   Review of Systems  Constitutional: Negative for fever.  HENT: Negative for ear pain.  Eyes: Negative for pain.  Respiratory: Negative for cough.   Cardiovascular: Negative for chest pain.  Gastrointestinal: Negative for abdominal pain.  Genitourinary: Negative for flank pain.  Musculoskeletal: Negative for back pain.  Skin: Negative for rash.  Neurological: Negative for headaches.    Physical Exam Updated Vital Signs BP (!) 170/79   Pulse 71   Temp 97.8 F (36.6 C) (Oral)   Resp 18   Ht 5\' 5"  (1.651 m)   Wt 70.3 kg   SpO2 100%   BMI 25.79 kg/m   Physical Exam Constitutional:      General: She is not in acute distress.    Appearance: Normal appearance.  HENT:     Head: Normocephalic.     Nose: Nose normal.  Eyes:     Extraocular Movements: Extraocular movements intact.   Cardiovascular:     Rate and Rhythm: Normal rate.  Pulmonary:     Effort: Pulmonary effort is normal.  Musculoskeletal:        General: Normal range of motion.     Cervical back: Normal range of motion.  Neurological:     General: No focal deficit present.     Mental Status: She is alert and oriented to person, place, and time. Mental status is at baseline.     ED Results / Procedures / Treatments   Labs (all labs ordered are listed, but only abnormal results are displayed) Labs Reviewed  URINE CULTURE - Abnormal; Notable for the following components:      Result Value   Culture MULTIPLE SPECIES PRESENT, SUGGEST RECOLLECTION (*)    All other components within normal limits  CBC WITH DIFFERENTIAL/PLATELET - Abnormal; Notable for the following components:   Platelets 112 (*)    All other components within normal limits  BASIC METABOLIC PANEL - Abnormal; Notable for the following components:   Glucose, Bld 119 (*)    GFR, Estimated 56 (*)    All other components within normal limits  URINALYSIS, ROUTINE W REFLEX MICROSCOPIC - Abnormal; Notable for the following components:   APPearance HAZY (*)    Ketones, ur 20 (*)    Leukocytes,Ua TRACE (*)    Bacteria, UA RARE (*)    All other components within normal limits  CBG MONITORING, ED - Abnormal; Notable for the following components:   Glucose-Capillary 153 (*)    All other components within normal limits    EKG None  Radiology No results found.  Procedures Procedures (including critical care time)  Medications Ordered in ED Medications - No data to display  ED Course  I have reviewed the triage vital signs and the nursing notes.  Pertinent labs & imaging results that were available during my care of the patient were reviewed by me and considered in my medical decision making (see chart for details).    MDM Rules/Calculators/A&P                          She has no tremors at rest at this time.  She has no  exertional tremors either on exam.  She is otherwise awake and alert her neuro exam is benign.  Patient able to tell a history of what kind of work she used to do where she lives who her family is appears awake oriented and alert x3.  Labs show resolution of her hyponatremia.  Advised her to continue to follow-up with her primary care doctor within the week.  Advised immediate return  for pain fevers vomiting cough or any additional concerns.   Final Clinical Impression(s) / ED Diagnoses Final diagnoses:  Occasional tremors    Rx / DC Orders ED Discharge Orders    None       Cheryll Cockayne, MD 09/28/20 [id number]

## 2020-09-26 LAB — URINE CULTURE

## 2020-09-27 DIAGNOSIS — H43813 Vitreous degeneration, bilateral: Secondary | ICD-10-CM | POA: Diagnosis not present

## 2020-09-27 DIAGNOSIS — H2513 Age-related nuclear cataract, bilateral: Secondary | ICD-10-CM | POA: Diagnosis not present

## 2020-09-27 DIAGNOSIS — E119 Type 2 diabetes mellitus without complications: Secondary | ICD-10-CM | POA: Diagnosis not present

## 2020-09-27 LAB — HM DIABETES EYE EXAM

## 2020-09-29 ENCOUNTER — Other Ambulatory Visit: Payer: Self-pay

## 2020-09-29 ENCOUNTER — Ambulatory Visit (INDEPENDENT_AMBULATORY_CARE_PROVIDER_SITE_OTHER): Payer: Medicare Other | Admitting: Family

## 2020-09-29 ENCOUNTER — Encounter: Payer: Self-pay | Admitting: Family

## 2020-09-29 VITALS — BP 140/90 | HR 106 | Temp 97.5°F | Resp 16 | Wt 132.4 lb

## 2020-09-29 DIAGNOSIS — I10 Essential (primary) hypertension: Secondary | ICD-10-CM

## 2020-09-29 DIAGNOSIS — R2681 Unsteadiness on feet: Secondary | ICD-10-CM

## 2020-09-29 DIAGNOSIS — R29898 Other symptoms and signs involving the musculoskeletal system: Secondary | ICD-10-CM | POA: Diagnosis not present

## 2020-09-29 DIAGNOSIS — E871 Hypo-osmolality and hyponatremia: Secondary | ICD-10-CM | POA: Diagnosis not present

## 2020-09-29 DIAGNOSIS — R410 Disorientation, unspecified: Secondary | ICD-10-CM | POA: Diagnosis not present

## 2020-09-29 DIAGNOSIS — R251 Tremor, unspecified: Secondary | ICD-10-CM

## 2020-09-29 MED ORDER — CLONIDINE HCL 0.1 MG PO TABS
0.1000 mg | ORAL_TABLET | Freq: Once | ORAL | Status: AC
  Administered 2020-09-29: 0.1 mg via ORAL

## 2020-09-29 NOTE — Progress Notes (Signed)
Provider: Brian Zeitlin FNP-C  Gayland Curry, DO  Patient Care Team: Gayland Curry, DO as PCP - General (Geriatric Medicine) Clent Jacks, MD as Consulting Physician (Ophthalmology)  Extended Emergency Contact Information Primary Emergency Contact: Christa,Edith Address: [address]          [place], [place] [address] [place] Phone: [phone] Relation: Daughter Secondary Emergency Contact: McWhite, Olin Hauser Address: [place], [place] [address] [place] Mobile Phone: [phone] Relation: Granddaughter  Code Status:  DNR Goals of care: Advanced Directive information Advanced Directives 09/30/2020  Does Patient Have a Medical Advance Directive? No  Type of Advance Directive -  Does patient want to make changes to medical advance directive? No - Patient declined  Copy of [place] in Chart? -  Would patient like information on creating a medical advance directive? -  Pre-existing out of facility DNR order (yellow form or pink MOST form) -     Chief Complaint  Patient presents with  . Hospitalization Follow-up    Follow up from ED 09/25/2020 for Tremors.     HPI:  Pt is a 86 y.o. female seen today for an acute visit for  ED follow up for worsening generalized intermittent tremors.She is here with her daughter.Her tremors were resolved while in the ED and was A& O X 3.Neuro exam was normal.Labs were normal.she was advised to follow up with PCP in one week.  Has had worsening weakness of right hand.No Numbness or tingling of hand.Memory issues.Also denies any pain on arm or neck.No Injuries or fall episode. 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 Blood pressure elevated today did not take blood pressure medication since was fasting thought provider might need fasting labs done.    Past Medical  History:  Diagnosis Date  . Diabetes mellitus   . Hyperlipidemia   . Hypertension   . Myalgia and myositis, unspecified   . 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 Vitamin D deficiency    Past Surgical History:  Procedure Laterality Date  . 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 ACCU-CHEK GUIDE test strip USE 1-2 TIMES DAILY AS NEEDED  . aspirin 81 MG tablet Take 81 mg by mouth daily.  . Cranberry 475 MG CAPS Take 1 capsule (475 mg total) by mouth 2 (two) times daily.  . Lancets (ACCU-CHEK SOFT TOUCH) lancets 1 each by Other route as needed for other. Use as instructed  DX E11.9  . lisinopril (ZESTRIL) 40 MG tablet TAKE 1 TABLET BY MOUTH ONCE DAILY FOR BLOOD PRESSURE  . metFORMIN (GLUCOPHAGE) 500 MG tablet Take 1 tablet (500 mg total) by mouth 3 (three) times daily.  . sodium chloride 1 g tablet Take 1 tablet (1 g total) by mouth daily for 30 doses.  . [EXPIRED] cloNIDine (CATAPRES) tablet 0.1 mg    No facility-administered encounter medications on file as of 09/29/2020.    Review of Systems  Constitutional: Negative for chills, fatigue and fever.  HENT: Negative for congestion, rhinorrhea, sinus pressure, sinus pain, sneezing, sore throat and trouble swallowing.   Respiratory: Negative for cough, chest tightness, shortness of breath and wheezing.   Cardiovascular:  Negative for chest pain, palpitations and leg swelling.  Gastrointestinal: Negative for abdominal distention, abdominal pain, constipation, diarrhea, nausea and vomiting.  Endocrine: Negative for cold intolerance, heat intolerance, polydipsia, polyphagia and polyuria.  Genitourinary: Negative for difficulty urinating, dysuria, flank pain, frequency and urgency.  Musculoskeletal: Positive for gait problem. Negative for arthralgias, back pain, joint swelling, myalgias, neck pain and  neck stiffness.  Skin: Negative for color change, pallor and rash.  Neurological: Negative for dizziness, seizures, speech difficulty, weakness, light-headedness, numbness and headaches.  Hematological: Does not bruise/bleed easily.  Psychiatric/Behavioral: Positive for confusion. Negative for agitation, behavioral problems and sleep disturbance. The patient is not nervous/anxious.     Immunization History  Administered Date(s) Administered  . Moderna Sars-Covid-2 Vaccination 02/19/2020, 03/18/2020  . Tdap 11/13/2011   Pertinent  Health Maintenance Due  Topic Date Due  . FOOT EXAM  02/08/2021  . HEMOGLOBIN A1C  03/30/2021  . OPHTHALMOLOGY EXAM  09/27/2021  . DEXA SCAN  Completed   Fall Risk  09/30/2020 09/29/2020 09/22/2020 09/13/2020 03/23/2020  Falls in the past year? 0 0 0 0 1  Number falls in past yr: 0 0 0 0 0  Comment - - - - -  Injury with Fall? 0 0 0 0 0   Functional Status Survey:    Vitals:   09/29/20 0908 09/29/20 1009  BP: (!) 160/100 140/90  Pulse: (!) 106   Resp: 16   Temp: (!) 97.5 F (36.4 C)   SpO2: 98%   Weight: 132 lb 6.4 oz (60.1 kg)    Body mass index is 22.03 kg/m. Physical Exam Vitals reviewed.  Constitutional:      General: She is not in acute distress.    Appearance: She is normal weight. She is not ill-appearing.  HENT:     Head: Normocephalic.     Right Ear: Tympanic membrane, ear canal and external ear normal. There is no impacted cerumen.     Left Ear: Tympanic membrane, ear canal and external ear normal. There is no impacted cerumen.     Nose: Nose normal. No congestion or rhinorrhea.     Mouth/Throat:     Mouth: Mucous membranes are moist.     Pharynx: Oropharynx is clear. No oropharyngeal exudate or posterior oropharyngeal erythema.  Eyes:     General: No scleral icterus.       Right eye: No discharge.        Left eye: No discharge.     Extraocular Movements: Extraocular movements intact.     Conjunctiva/sclera: Conjunctivae normal.      Pupils: Pupils are equal, round, and reactive to light.  Neck:     Vascular: No carotid bruit.  Cardiovascular:     Rate and Rhythm: Normal rate and regular rhythm.     Pulses: Normal pulses.     Heart sounds: Normal heart sounds. No murmur heard. No friction rub. No gallop.      Comments: HR 88 b/min Apical  Pulmonary:     Effort: Pulmonary effort is normal. No respiratory distress.     Breath sounds: Normal breath sounds. No wheezing, rhonchi or rales.  Chest:     Chest wall: No tenderness.  Abdominal:     General: Bowel sounds are normal. There is no distension.     Palpations: Abdomen is soft. There is no mass.     Tenderness: There is no abdominal tenderness. There is no right CVA tenderness, left CVA tenderness, guarding or rebound.  Musculoskeletal:  General: No swelling or tenderness.     Cervical back: Normal range of motion. No rigidity or tenderness.     Right lower leg: No edema.     Left lower leg: No edema.     Comments: Wide base gait using a cane during visit   Lymphadenopathy:     Cervical: No cervical adenopathy.  Skin:    General: Skin is warm and dry.     Coloration: Skin is not pale.     Findings: No bruising, erythema or rash.  Neurological:     Mental Status: She is alert. Mental status is at baseline.     Cranial Nerves: No cranial nerve deficit.     Coordination: Finger-Nose-Finger Test abnormal.     Gait: Gait abnormal.     Comments: Romberg and heel to shin test not performed due to safety issues . Right hand grip weakness noted.No tremors observed during visit.   Psychiatric:        Mood and Affect: Mood normal.        Behavior: Behavior normal.        Thought Content: Thought content normal.        Judgment: Judgment normal.     Comments: Forgetful      Labs reviewed: Recent Labs    02/09/20 0938 02/09/20 0945 09/22/20 1143 09/25/20 0830 09/29/20 1027  NA  --    < > 131* 135 137  K  --    < > 4.2 4.3 4.2  CL  --    < >  96* 101 99  CO2  --    < > 26 23 29   GLUCOSE  --    < > 127 119* 123*  BUN  --    < > 11 13 19   CREATININE  --    < > 0.90* 1.00 1.07*  CALCIUM  --    < > 10.2 9.5 10.0  MG 2.1  --   --   --  1.9   < > = values in this interval not displayed.   Recent Labs    09/22/20 1143 09/29/20 1027  AST 29 27  ALT 12 11  BILITOT 0.4 0.3  PROT 7.7 7.5   Recent Labs    09/21/20 2001 09/22/20 1143 09/25/20 0830  WBC 6.9 7.8 6.6  NEUTROABS  --  4,126 3.2  HGB 12.1 12.7 12.6  HCT 37.5 37.3 39.1  MCV 91.7 87.1 91.1  PLT 130* 143 112*   Lab Results  Component Value Date   TSH 2.88 05/30/2016   Lab Results  Component Value Date   HGBA1C 6.7 (H) 09/30/2020   Lab Results  Component Value Date   CHOL 209 (H) 02/09/2020   HDL 52 02/09/2020   LDLCALC 131 (H) 02/09/2020   LDLDIRECT 75 09/26/2016   TRIG 148 02/09/2020   CHOLHDL 4.0 02/09/2020    Significant Diagnostic Results in last 30 days:  No results found.  Assessment/Plan 1. Right hand weakness Worsening weakness with intermittent tremors.  - weak grip with abnormal finger to nose exam. Romberg and heel to shin test deferred due to balance issues and safety. No facial drooping or slurred speech.  - Ambulatory referral to Neurology - urgent referral ordered.made aware specialist office will call her but if symptoms worsen to go to ED as soon as possible. Recent CT scan showed no acute intracranial abnormalities.Will order urgent MRI of the brain.Grandaughter made aware [facility] at [facility] [place]  will call for appointment.  - MR Brain Wo Contrast; Future - Ambulatory referral to [place] for gait stability,ROM,exercise and muscle strengthening.  2. Unsteady gait Worsening stability wide base gait noted though states trying to balance to prevent falls.  - Ambulatory referral to Neurology as above.  - MR Brain Wo Contrast; Future - Ambulatory referral to [place]  3. Tremor of right  hand Intermittent.will obtain blood work to rule out other metabolic etiologies.  - Ambulatory referral to Neurology - MR Brain Wo Contrast; Future - Ambulatory referral to [place] with eGFR([facility])  4. Hyponatremia Resolved. - continue on sodium tablet. - CMP with eGFR([facility])  5. Essential hypertension, benign B/p elevated upon arrival though did not take her B/p medication.Clonidine administered by CMA B/p and HR  rechecked improved. - continue on lisinopril  - advised to check B/p at home and notify provider if readings > 140/90  - cloNIDine (CATAPRES) tablet 0.1 mg  Family/ staff Communication: Reviewed plan of care with patient  Labs/tests ordered:  - Magnesium - CMP with eGFR([facility]) - MR Brain Wo Contrast; Future  Next Appointment: As needed if symptoms worsen or fail to improve.   Sandrea Hughs, NP

## 2020-09-29 NOTE — Patient Instructions (Signed)
-   MRI of the brain ordered [facility] will call you for appointment  - Physical Therapy ordered Agency will call you for appointment   - Notify provider or go to ED if symptoms worsen   - Referral to Neurologist ordered today specialist office will call you for appointment

## 2020-09-30 ENCOUNTER — Ambulatory Visit (INDEPENDENT_AMBULATORY_CARE_PROVIDER_SITE_OTHER): Payer: Medicare Other | Admitting: Internal Medicine

## 2020-09-30 ENCOUNTER — Encounter: Payer: Self-pay | Admitting: Internal Medicine

## 2020-09-30 VITALS — BP 138/86 | HR 82 | Temp 97.5°F | Ht 65.0 in | Wt 132.4 lb

## 2020-09-30 DIAGNOSIS — M62838 Other muscle spasm: Secondary | ICD-10-CM

## 2020-09-30 DIAGNOSIS — I10 Essential (primary) hypertension: Secondary | ICD-10-CM | POA: Diagnosis not present

## 2020-09-30 DIAGNOSIS — E1169 Type 2 diabetes mellitus with other specified complication: Secondary | ICD-10-CM | POA: Diagnosis not present

## 2020-09-30 DIAGNOSIS — E871 Hypo-osmolality and hyponatremia: Secondary | ICD-10-CM | POA: Diagnosis not present

## 2020-09-30 DIAGNOSIS — E785 Hyperlipidemia, unspecified: Secondary | ICD-10-CM | POA: Diagnosis not present

## 2020-09-30 DIAGNOSIS — R2681 Unsteadiness on feet: Secondary | ICD-10-CM

## 2020-09-30 LAB — COMPLETE METABOLIC PANEL WITH GFR
AG Ratio: 1.3 (calc) (ref 1.0–2.5)
ALT: 11 U/L (ref 6–29)
AST: 27 U/L (ref 10–35)
Albumin: 4.3 g/dL (ref 3.6–5.1)
Alkaline phosphatase (APISO): 49 U/L (ref 37–153)
BUN/Creatinine Ratio: 18 (calc) (ref 6–22)
BUN: 19 mg/dL (ref 7–25)
CO2: 29 mmol/L (ref 20–32)
Calcium: 10 mg/dL (ref 8.6–10.4)
Chloride: 99 mmol/L (ref 98–110)
Creat: 1.07 mg/dL — ABNORMAL HIGH (ref 0.60–0.88)
GFR, Est African American: 56 mL/min/{1.73_m2} — ABNORMAL LOW (ref >=60)
GFR, Est Non African American: 48 mL/min/{1.73_m2} — ABNORMAL LOW (ref >=60)
Globulin: 3.2 g/dL (calc) (ref 1.9–3.7)
Glucose, Bld: 123 mg/dL — ABNORMAL HIGH (ref 65–99)
Potassium: 4.2 mmol/L (ref 3.5–5.3)
Sodium: 137 mmol/L (ref 135–146)
Total Bilirubin: 0.3 mg/dL (ref 0.2–1.2)
Total Protein: 7.5 g/dL (ref 6.1–8.1)

## 2020-09-30 LAB — MAGNESIUM: Magnesium: 1.9 mg/dL (ref 1.5–2.5)

## 2020-09-30 LAB — URINE CULTURE
MICRO NUMBER:: 11462225
Result:: NO GROWTH
SPECIMEN QUALITY:: ADEQUATE

## 2020-09-30 NOTE — Progress Notes (Signed)
Location:  [facility] clinic Provider:  Raylin Winer L. Mariea Clonts, D.O., C.M.D.  Code Status: FULL CODE as of prior discussions and due to prior Guillain-Barre from vaccination--she may feel differently now  Goals of Care:   Advanced Directives 09/30/2020  Does Patient Have a Medical Advance Directive? No  Type of Advance Directive -  Does patient want to make changes to medical advance directive? No - Patient declined  Copy of [place] in Chart? -  Would patient like information on creating a medical advance directive? -  Pre-existing out of facility DNR order (yellow form or pink MOST form) -     Chief Complaint  Patient presents with  . Acute Visit    Follow up from 09/29/2020. Granddaughter stated that she thinks [name] may have had a stroke around 09/21/2020 because since then she has lost weight, unsteady gait, right side weakness, confused. Discuss referral for a walker.     HPI: Patient is a 86 y.o. female seen today for medical management of chronic diseases.    She is here with Jeannene Patella, her eldest granddaughter.     Until 2 wks ago, she was ok.  One morning, mouth was bleeding.  They thought she may have fallen overnight.  EMS noted very high bp.  Then she started c/o tremors in her right side, could not pick up items, gait was off.  Aunts tried to take her to the ED at 5pm and by 2am, she was belligerent and they wound up leaving (had some labs with hyponatremia).  She's not eating, she's having incontinence.  Daughter came back to stay with her.  Her speech/conversation is not making sense.  She was asking for her pocketbook and was asking for a blue strap.  Granddaughter had been a CNA and wonders if she's had a stroke.  Has appt for MRI on 10/05/20.  She can't hold anything to drink.  Can't stand to cook.    She's also not wanting to take her meds.  Pills look different due to [facility] changing manufacturer which upset her.    Only things I'd noted prior to this were one  missed appt and some forgetfulness about the   Needs walker and needs raised toilet seat.  BP better today.  Had refused meds the night before the other appt.   Past Medical History:  Diagnosis Date  . Diabetes mellitus   . Hyperlipidemia   . Hypertension   . Myalgia and myositis, unspecified   . 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 Vitamin D deficiency     Past Surgical History:  Procedure Laterality Date  . 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 ACCU-CHEK GUIDE test strip USE 1-2 TIMES DAILY AS NEEDED  . aspirin 81 MG tablet Take 81 mg by mouth daily.  . Cranberry 475 MG CAPS Take 1 capsule (475 mg total) by mouth 2 (two) times daily.  . Lancets (ACCU-CHEK SOFT TOUCH) lancets 1 each by Other route as needed for other. Use as instructed  DX E11.9  . lisinopril (ZESTRIL) 40 MG tablet TAKE 1 TABLET BY MOUTH ONCE DAILY FOR BLOOD PRESSURE  . metFORMIN (GLUCOPHAGE) 500 MG tablet Take 1 tablet (500 mg total) by mouth 3 (three) times daily.  . sodium chloride 1 g tablet Take 1 tablet (1 g total) by mouth daily for 30  doses.   No facility-administered encounter medications on file as of 09/30/2020.    Review of Systems:  Review of Systems  Constitutional: Positive for malaise/fatigue and weight loss. Negative for chills and fever.  HENT: Negative for congestion and sore throat.        No difficulty swallowing reported  Eyes: Positive for blurred vision. Negative for double vision.       Glasses  Respiratory: Negative for cough and shortness of breath.   Cardiovascular: Negative for chest pain, palpitations and leg swelling.  Gastrointestinal: Negative for abdominal pain and constipation.  Genitourinary: Negative for dysuria.  Musculoskeletal: Negative for falls and joint pain.       Suspected fall with injury of face (had bleeding from  lip when events began per granddaughter)  Skin: Negative for itching and rash.  Neurological: Positive for tremors and focal weakness. Negative for dizziness, tingling, sensory change, loss of consciousness and headaches.       Rigidity and spasms of RUE  Endo/Heme/Allergies: Does not bruise/bleed easily.  Psychiatric/Behavioral: Positive for memory loss. Negative for depression and hallucinations. The patient is not nervous/anxious and does not have insomnia.     Health Maintenance  Topic Date Due  . HEMOGLOBIN A1C  08/10/2020  . COVID-19 Vaccine (3 - Booster for Moderna series) 09/18/2020  . FOOT EXAM  02/08/2021  . OPHTHALMOLOGY EXAM  05/24/2021  . TETANUS/TDAP  11/12/2021  . DEXA SCAN  Completed    Physical Exam: Vitals:   09/30/20 1135  Temp: (!) 97.5 F (36.4 C)  Weight: 132 lb 6.4 oz (60.1 kg)  Height: 5' 5"  (1.651 m)   Body mass index is 22.03 kg/m. Physical Exam Vitals reviewed. Nursing note reviewed: NP notes reviewed.  Constitutional:      General: She is not in acute distress. HENT:     Head: Normocephalic and atraumatic.     Right Ear: External ear normal.     Left Ear: External ear normal.     Nose: Nose normal.     Mouth/Throat:     Pharynx: Oropharynx is clear.  Eyes:     Extraocular Movements: Extraocular movements intact.     Conjunctiva/sclera: Conjunctivae normal.     Pupils: Pupils are equal, round, and reactive to light.  Cardiovascular:     Rate and Rhythm: Normal rate and regular rhythm.     Pulses: Normal pulses.     Heart sounds: Normal heart sounds.  Abdominal:     General: Bowel sounds are normal.     Palpations: Abdomen is soft.     Tenderness: There is no abdominal tenderness. There is no guarding or rebound.  Neurological:     Mental Status: She is alert.     Cranial Nerves: No cranial nerve deficit.     Motor: Weakness present.     Coordination: Coordination abnormal.     Gait: Gait abnormal.     Comments: Spasticity of RUE with  some involuntary movements (flexion up toward face), very unsteady   Psychiatric:     Comments: Normally initiates conversation with normal speech, had minute amount of memory loss not even to call [facility] but today, only answers questions and apparently giving some incorrect history that her granddaughter has to supplement/correct; has also gotten agitated and upset with family at times but calm during visit     Labs reviewed: Basic Metabolic Panel: Recent Labs    02/09/20 0938 02/09/20 0945 09/22/20 1143 09/25/20 0830 09/29/20 1027  NA  --    < >  131* 135 137  K  --    < > 4.2 4.3 4.2  CL  --    < > 96* 101 99  CO2  --    < > 26 23 29   GLUCOSE  --    < > 127 119* 123*  BUN  --    < > 11 13 19   CREATININE  --    < > 0.90* 1.00 1.07*  CALCIUM  --    < > 10.2 9.5 10.0  MG 2.1  --   --   --  1.9   < > = values in this interval not displayed.   Liver Function Tests: Recent Labs    09/22/20 1143 09/29/20 1027  AST 29 27  ALT 12 11  BILITOT 0.4 0.3  PROT 7.7 7.5   No results for input(s): LIPASE, AMYLASE in the last 8760 hours. No results for input(s): AMMONIA in the last 8760 hours. CBC: Recent Labs    09/21/20 2001 09/22/20 1143 09/25/20 0830  WBC 6.9 7.8 6.6  NEUTROABS  --  4,126 3.2  HGB 12.1 12.7 12.6  HCT 37.5 37.3 39.1  MCV 91.7 87.1 91.1  PLT 130* 143 112*   Lipid Panel: Recent Labs    02/09/20 0945  CHOL 209*  HDL 52  LDLCALC 131*  TRIG 148  CHOLHDL 4.0   Lab Results  Component Value Date   HGBA1C 6.5 (H) 02/09/2020    Assessment/Plan 1. Unsteady gait - new onset, walked entirely normally when last seen by me - Walker rolling lightweight requested so can easily transport--DME order entered and paper copy given to her granddaughter  2. Muscle spasticity -new onset in RUE, suspect new stroke with this vs focal seizure, new confusion, incontinence, ADL dependence, loss of appetite since initial event -discussed that ideally, she'd benefit for snf  rehab but family does want to keep her at home  -home health referral for PT, OT also entered  3. Hyponatremia -recheck bmp to ensure improvement -family working hard to get her regular meals and taking shifts with care--she has several children--fortunately, several are CNAs  4. Essential hypertension, benign -bp at upper limits -need to know if she's had an acute cva before addressing this further  5. Type 2 diabetes mellitus with other specified complication, without long-term current use of insulin (HCC) -has been well controlled with current regimen of metformin which does not typically cause hypoglycemia so continue same--on odd dose of tid Lab Results  Component Value Date   HGBA1C 6.7 (H) 09/30/2020   - Hemoglobin A1c  6. Hyperlipidemia associated with type 2 diabetes mellitus ([place]) -goal LDL under 70, not on statin at her choice  -she's always tried to avoid pills and had made considerable changes with diet and exercise to get numbers down when I first met her, but this declined amid the pandemic Lab Results  Component Value Date   CHOL 209 (H) 02/09/2020   HDL 52 02/09/2020   LDLCALC 131 (H) 02/09/2020   LDLDIRECT 75 09/26/2016   TRIG 148 02/09/2020   CHOLHDL 4.0 02/09/2020   needs statin now if she'll take it  Labs/tests ordered:   Orders Placed This Encounter  Procedures  . Walker rolling    Right weakness, tremor, possible stroke  . Hemoglobin A1c    Order Specific Question:   Release to patient    Answer:   Immediate   Next appt:  10/06/2020   Calen Geister L. Rajat Staver, D.O. Geriatrics [employer]  [facility] [address], [place] [address] Cell Phone (Mon-Fri 8am-5pm):  [phone] On Call:  [phone] & follow prompts after 5pm & weekends Office Phone:  [phone] Office Fax:  [phone]

## 2020-10-01 ENCOUNTER — Other Ambulatory Visit: Payer: Self-pay

## 2020-10-01 ENCOUNTER — Encounter: Payer: Self-pay | Admitting: *Deleted

## 2020-10-01 DIAGNOSIS — R7989 Other specified abnormal findings of blood chemistry: Secondary | ICD-10-CM

## 2020-10-01 LAB — HEMOGLOBIN A1C
Hgb A1c MFr Bld: 6.7 % of total Hgb — ABNORMAL HIGH (ref <5.7)
Mean Plasma Glucose: 146 mg/dL
eAG (mmol/L): 8.1 mmol/L

## 2020-10-01 NOTE — Progress Notes (Signed)
Assessment/Plan:   1.  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  Patient was hyponatremic, but generally one does not see seizures with sodium in the mid 120s  -we will do CT brain today.  While one can certainly have seizure at the onset of (typically large) ischemic strokes, it is very common at the onset of hemmorhagic stroke, and I would like to do CT brain today to make sure we are not seeing blood (addendum: CT brain negative, personally reviewed once completed)  -MRI brain has been ordered and pending for 10/05/20 (tomorrow)  -last fasting lipids in June 2021.  LDL was 131 with goal less than 70.  crestor just started today by pcp (pt was apparently supposed to be taking but wasn't).   -Depending on results of above, we will likely do TSH when I see her later in the week.  -I will follow up with the patient in my office in a few days.  At that point in time, we will likely order PT/OT/ST    Subjective:   Jocelyn Hendrix was seen today in the movement disorders clinic for neurologic consultation at the request of Ngetich, Dinah C, NP.  The consultation is for the evaluation of tremor and weakness.  Pt with daughter who supplements hx.  Patient was taken to the emergency room on January 18 with complaints of slurred speech and gait change for the prior 4 days.  She left before being seen.  She was seen by the nurse practitioner the following day at her primary care office.  Records indicate that the patient had not been taking her medication properly and the patient was hyponatremic with a sodium of 127, so that was felt the etiology of her symptoms.  Patient was seen in the emergency room for tremor on January 22.  Those notes also have been reviewed.  Emergency room notes indicate that patient reported that she had intermittent generalized tremor for "a long time."  Emergency room indicated a "benign"  neurology exam and she was discharged from the emergency room.  She followed up with primary care nurse practitioner 4 days later.  Nurse practitioner noted that patient stated that she was having "worsening weakness of right hand" in addition to memory issues.  The following day she saw Dr. Renato Gails.  Dr. Ernest Mallick notes indicate that family felt that patient may have had a stroke.  Dr. Hebert Soho notes not yet completed for my review.  Daughter states today the week before the initial ER, she went to bingo and was normal then (Jan 13).  On 1/17, she told the patients daughter that she woke up with blood in her mouth.  Her daughter wondered if she had fallen.  Pt called ems and BP was elevated.  Pt was complaining that the R hand was shaking and she could not do anything with it.  She went to the ER and she couldn't get her pants down because the R hand was "tremoring and weak" and she urinated on herself.  This is why they left the ER on 1/18.  They went back to the ER on 1/22 because of weakness on the R hand and leg and "trembling" of the R hand.  She is sleeping all of the time and losing weight.  She is confused and daughter states that she is not generally confused.  Daughter states that pt is having trouble getting  out words.  No paresthesias.  Has blurred vision d/t cataracts but no diplopia.  Prior to this, patient was living on her own without any trouble.  She did not drive.  Currently, daughter staying with her and helping her manage.   Last MRI of the brain was done in October, 2018.  I personally reviewed that.  There was mild to moderate white matter disease.  It was otherwise unremarkable.   ALLERGIES:  No Known Allergies  CURRENT MEDICATIONS:  Current Outpatient Medications  Medication Instructions  . ACCU-CHEK GUIDE test strip USE 1-2 TIMES DAILY AS NEEDED  . aspirin 81 mg, Daily  . Cranberry 475 mg, Oral, 2 times daily  . Lancets (ACCU-CHEK SOFT TOUCH) lancets 1 each, Other, As needed, Use as  instructed  DX E11.9  . lisinopril (ZESTRIL) 40 MG tablet TAKE 1 TABLET BY MOUTH ONCE DAILY FOR BLOOD PRESSURE  . metFORMIN (GLUCOPHAGE) 500 mg, Oral, 3 times daily  . rosuvastatin (CRESTOR) 5 mg, Oral, Daily    Objective:   PHYSICAL EXAMINATION:    VITALS:   Vitals:   10/04/20 1301  BP: 124/70  Pulse: (!) 106  SpO2: 99%  Weight: 129 lb (58.5 kg)  Height: 5\' 5"  (1.651 m)    GEN:  The patient appears stated age and is in NAD. HEENT:  Normocephalic, atraumatic.  The mucous membranes are moist. The superficial temporal arteries are without ropiness or tenderness. CV: Tachycardic.  Regular. Lungs:  CTAB Neck/HEME:  There are no carotid bruits bilaterally.  Neurological examination:  Orientation: The patient is alert and oriented x3.  Daughter provides most of the history. Cranial nerves: There is good facial symmetry.  Extraocular muscles are intact. The visual fields are full to confrontational testing. The speech is fluent and clear. Soft palate rises symmetrically and there is no tongue deviation. Hearing is intact to conversational tone. Sensation: Sensation is intact to light touch throughout (facial, trunk, extremities). Vibration is intact at the bilateral big toe. There is no extinction with double simultaneous stimulation.  Motor: Strength is 5/5 in the bilateral upper and lower extremities.   Shoulder shrug is equal and symmetric.  There is no pronator drift. Deep tendon reflexes: Deep tendon reflexes are 2-/4 at the bilateral biceps, triceps, brachioradialis, patella and achilles. Plantar responses is upgoing on the right and neutral on the left.  Movement examination: Tone: There is normal tone in the bilateral upper extremities.  The tone in the lower extremities is normal.  Abnormal movements: no tremor but she is very ataxic with the right arm only.  Her R arm will float up in the air when asked to have it in the lap.  When asked to hold pronator drift, the R arm  doesn't pronate, but it does drift toward the ceiling. Coordination:  There is no decremation with RAM's, but she has ataxia with finger-to-nose on the right and some with finger-to-nose on the left, but nothing like the right.  She has no dysdiadochokinesia. Gait and Station: The patient pushes off of the chair to arise.  She ambulates fairly well with a walker.  Without the walker, she is short stepped and unsteady and tenuous and holds onto the examiner.   I have reviewed and interpreted the following labs independently   Chemistry      Component Value Date/Time   NA 137 09/29/2020 1027   NA 140 01/06/2016 0845   K 4.2 09/29/2020 1027   CL 99 09/29/2020 1027   CO2 29 09/29/2020  1027   BUN 19 09/29/2020 1027   BUN 16 01/06/2016 0845   CREATININE 1.07 (H) 09/29/2020 1027      Component Value Date/Time   CALCIUM 10.0 09/29/2020 1027   ALKPHOS 80 01/06/2016 0845   AST 27 09/29/2020 1027   ALT 11 09/29/2020 1027   BILITOT 0.3 09/29/2020 1027   BILITOT 0.3 01/06/2016 0845      Lab Results  Component Value Date   TSH 2.88 05/30/2016   Lab Results  Component Value Date   WBC 6.6 09/25/2020   HGB 12.6 09/25/2020   HCT 39.1 09/25/2020   MCV 91.1 09/25/2020   PLT 112 (L) 09/25/2020   Lab Results  Component Value Date   CHOL 209 (H) 02/09/2020   CHOL 188 08/14/2019   CHOL 212 (H) 04/09/2019   Lab Results  Component Value Date   HDL 52 02/09/2020   HDL 52 08/14/2019   HDL 56 04/09/2019   Lab Results  Component Value Date   LDLCALC 131 (H) 02/09/2020   LDLCALC 114 (H) 08/14/2019   LDLCALC 131 (H) 04/09/2019   Lab Results  Component Value Date   TRIG 148 02/09/2020   TRIG 114 08/14/2019   TRIG 137 04/09/2019   Lab Results  Component Value Date   CHOLHDL 4.0 02/09/2020   CHOLHDL 3.6 08/14/2019   CHOLHDL 3.8 04/09/2019   Lab Results  Component Value Date   LDLDIRECT 75 09/26/2016      Total time spent on today's visit was 70 minutes, including both  face-to-face time and nonface-to-face time.  Time included that spent on review of records (prior notes available to me/labs/imaging if pertinent), discussing treatment and goals, answering patient's questions and coordinating care.  Cc:  Reed, Tiffany L, DO

## 2020-10-01 NOTE — Progress Notes (Signed)
Please let her granddaughter know and hopefully her proxy mychart paperwork will soon be put in so she can see it on mychart:  Sugar average trended up.  When she was here, we discussed that Ms. Laperle was not taking her meds as directed so that is likely why.  The family is helping her now around the clock.

## 2020-10-03 DIAGNOSIS — R1032 Left lower quadrant pain: Secondary | ICD-10-CM | POA: Diagnosis not present

## 2020-10-03 DIAGNOSIS — R350 Frequency of micturition: Secondary | ICD-10-CM | POA: Diagnosis not present

## 2020-10-03 DIAGNOSIS — M791 Myalgia, unspecified site: Secondary | ICD-10-CM | POA: Diagnosis not present

## 2020-10-03 DIAGNOSIS — Z8669 Personal history of other diseases of the nervous system and sense organs: Secondary | ICD-10-CM | POA: Diagnosis not present

## 2020-10-03 DIAGNOSIS — E785 Hyperlipidemia, unspecified: Secondary | ICD-10-CM | POA: Diagnosis not present

## 2020-10-03 DIAGNOSIS — I10 Essential (primary) hypertension: Secondary | ICD-10-CM | POA: Diagnosis not present

## 2020-10-03 DIAGNOSIS — R251 Tremor, unspecified: Secondary | ICD-10-CM | POA: Diagnosis not present

## 2020-10-03 DIAGNOSIS — E559 Vitamin D deficiency, unspecified: Secondary | ICD-10-CM | POA: Diagnosis not present

## 2020-10-03 DIAGNOSIS — R2681 Unsteadiness on feet: Secondary | ICD-10-CM | POA: Diagnosis not present

## 2020-10-03 DIAGNOSIS — E1169 Type 2 diabetes mellitus with other specified complication: Secondary | ICD-10-CM | POA: Diagnosis not present

## 2020-10-03 DIAGNOSIS — K5790 Diverticulosis of intestine, part unspecified, without perforation or abscess without bleeding: Secondary | ICD-10-CM | POA: Diagnosis not present

## 2020-10-04 ENCOUNTER — Telehealth: Payer: Self-pay | Admitting: *Deleted

## 2020-10-04 ENCOUNTER — Other Ambulatory Visit: Payer: Self-pay

## 2020-10-04 ENCOUNTER — Ambulatory Visit (INDEPENDENT_AMBULATORY_CARE_PROVIDER_SITE_OTHER): Payer: Medicare Other | Admitting: Neurology

## 2020-10-04 ENCOUNTER — Encounter: Payer: Self-pay | Admitting: Neurology

## 2020-10-04 ENCOUNTER — Ambulatory Visit
Admission: RE | Admit: 2020-10-04 | Discharge: 2020-10-04 | Disposition: A | Discharge status: Home / Self Care | Payer: Medicare Other | Source: Ambulatory Visit | Attending: Neurology | Admitting: Neurology

## 2020-10-04 VITALS — BP 124/70 | HR 106 | Ht 65.0 in | Wt 129.0 lb

## 2020-10-04 DIAGNOSIS — G8190 Hemiplegia, unspecified affecting unspecified side: Secondary | ICD-10-CM | POA: Diagnosis not present

## 2020-10-04 DIAGNOSIS — R531 Weakness: Secondary | ICD-10-CM

## 2020-10-04 DIAGNOSIS — I6789 Other cerebrovascular disease: Secondary | ICD-10-CM | POA: Diagnosis not present

## 2020-10-04 DIAGNOSIS — R9401 Abnormal electroencephalogram [EEG]: Secondary | ICD-10-CM

## 2020-10-04 DIAGNOSIS — R569 Unspecified convulsions: Secondary | ICD-10-CM

## 2020-10-04 DIAGNOSIS — R29818 Other symptoms and signs involving the nervous system: Secondary | ICD-10-CM | POA: Diagnosis not present

## 2020-10-04 DIAGNOSIS — R29898 Other symptoms and signs involving the musculoskeletal system: Secondary | ICD-10-CM | POA: Diagnosis not present

## 2020-10-04 NOTE — Telephone Encounter (Signed)
Last hba1c was 6.7--I guess her granddaughter was not yet notified. She just had the lab on Thursday. Lab Results  Component Value Date   HGBA1C 6.7 (H) 09/30/2020   2.  Per last cholesterol lab that Dinah resulted: Cholesterol,LDL are higher than previous level.Recommend starting on Crestor 5 mg tablet one by mouth daily to lower cholesterol and LDL to prevent cardiovascular event such as stroke and heart attack. So, yes let's add the crestor back to her list and have her take it. Thx.

## 2020-10-04 NOTE — Telephone Encounter (Signed)
Patient granddaughter, Rinaldo Cloud, called and wanted to know patient's last HgBA1C.   Also stated that she found Rosuvastatin at patient's home that patient has not been taking. Wants to know if patient should start taking it. (wasn't sure of the mg, bottle is at patient's home)  Please Advise.

## 2020-10-04 NOTE — Telephone Encounter (Signed)
Patient daughter, Rubin Payor, [phone] Notified and agreed.  Medication list updated.

## 2020-10-04 NOTE — Procedures (Signed)
TECHNICAL SUMMARY:  A multichannel referential and bipolar montage EEG using the standard international 10-20 system was performed on the patient described as awake and drowsy.  The dominant background activity consists of 7-8 hertz activity seen most prominantly over the posterior head region.  3 to 5 Hz diffuse slowing is seen throughout all head regions, frontally predominant, left much more so than right.  Sharply contoured theta is noted, primarily in the left hemisphere compared to that of the right.  ACTIVATION:  Stepwise photic stimulation at 4-20 flashes per second was performed and did not elicit any epileptiform activity.  EPILEPTIFORM ACTIVITY:  There was no clear epileptiform activity, but as above there was sharply contoured theta in the left hemisphere compared to that of the right.  SLEEP: Physiologic drowsiness is noted.  IMPRESSION:  This is an abnormal EEG demonstrating: 1.  Diffuse slowing of electrocerebral activity. 2.  Slowing and sharply contoured theta in the left hemisphere compared to that of the right.  A structural lesion in the left hemisphere should be ruled out.

## 2020-10-05 ENCOUNTER — Ambulatory Visit
Admission: RE | Admit: 2020-10-05 | Discharge: 2020-10-05 | Disposition: A | Discharge status: Home / Self Care | Payer: Medicare Other | Source: Ambulatory Visit | Attending: Family | Admitting: Family

## 2020-10-05 ENCOUNTER — Telehealth: Payer: Self-pay | Admitting: Neurology

## 2020-10-05 ENCOUNTER — Other Ambulatory Visit: Payer: Self-pay | Admitting: Neurology

## 2020-10-05 DIAGNOSIS — R9082 White matter disease, unspecified: Secondary | ICD-10-CM | POA: Diagnosis not present

## 2020-10-05 DIAGNOSIS — R9389 Abnormal findings on diagnostic imaging of other specified body structures: Secondary | ICD-10-CM

## 2020-10-05 DIAGNOSIS — R531 Weakness: Secondary | ICD-10-CM | POA: Diagnosis not present

## 2020-10-05 DIAGNOSIS — R569 Unspecified convulsions: Secondary | ICD-10-CM

## 2020-10-05 DIAGNOSIS — R251 Tremor, unspecified: Secondary | ICD-10-CM

## 2020-10-05 DIAGNOSIS — R29898 Other symptoms and signs involving the musculoskeletal system: Secondary | ICD-10-CM

## 2020-10-05 DIAGNOSIS — R2681 Unsteadiness on feet: Secondary | ICD-10-CM

## 2020-10-05 NOTE — Telephone Encounter (Signed)
Tee, let pt/daughter know that I looked at MRI scan ordered by NP.  I need to order MRI brain with contrast now (this was done without contrast).  Please order stat and call radiology to find out if it can be just ordered with contrast since it will be done close to date of when without contrast done so we know how to put in order.  Dx:  Abnormal MRI brain, R sided weakness.

## 2020-10-05 NOTE — Progress Notes (Signed)
Assessment/Plan:   1.  Seizure (focal)  -Patient's MRI brain with evidence of cortical ribboning.  Suspect that this was a postictal phenomenon but the cause of the seizure is to be determined.  I am going to go ahead and start the patient on Keppra, 250 mg twice per day.  This is a very low dose, but we will see if she tolerates it and if it helps symptoms.  R/B/SE were discussed.  The opportunity to ask questions was given and they were answered to the best of my ability.  The patient expressed understanding and willingness to follow the outlined treatment protocols.  -do 48 hour ambulatory EEG  -Discussed possibility of CJD disease.  Discussed lumbar puncture.  Granddaughter really does not want to do this right now.  States that does not think patient will tolerate it (has noted behavioral aggression).  In addition, she just does not want to put her grandmother through that right now.  She will let me know if they change their mind and we can certainly schedule it.  Given the negative myoclonus seen last visit and the choreoathetotic movements on the right last visit, I think that this does need to be in the differential, even though it is quite rare.  What would go against it, is the very acute development of the symptoms.  Granddaughter understands and will discuss with family as well.  -I want to see patient back 3 weeks after EEG completed.  Granddaughter to call me if SE with keppra or if doesn't seem to help (too low dose)    Subjective:   Jocelyn Hendrix was seen today in follow up for R sided weakness.  My previous records as well as any outside records available were reviewed prior to todays visit.  Pt with granddaughter who supplements hx.  Pt with multiple tests done since last visit.  I personally read her EEG.  I also talked with our epileptologist, Dr. Karel Jarvis, about this.  Patient had sharply contoured theta on the left.  There was also slowing, more on the left than the right,  suggesting a possible structural lesion.  CT brain was negative.  MRI brain was ordered before she saw me and on the day after she saw me.  This was done without contrast.  I personally reviewed this.  I spoke to the radiologist about it.  There was restricted diffusion affecting the left caudate and cortex of the left hemisphere (cortical ribboning).  I subsequently ordered MRI brain with contrast.  This was just done yesterday and was negative.  Granddaughter states that her grandma is "in and out of confusion."  She asked her granddaughter in her own home "where are we."  Granddaughter states that R hand is "out of control."  Granddaughter states that they have noted some occasional similar symptoms on the left, but nothing like the right.  Granddaughter is a Scientist, clinical (histocompatibility and immunogenetics) and states that she is somewhat familiar with medical issues/memory change/confusion/medicines.  That is why her mother wanted her to attend the visit today instead of her.   CURRENT MEDICATIONS:  Outpatient Encounter Medications as of 10/07/2020  Medication Sig  . ACCU-CHEK GUIDE test strip USE 1-2 TIMES DAILY AS NEEDED  . aspirin 81 MG tablet Take 81 mg by mouth daily.  . Cranberry 475 MG CAPS Take 1 capsule (475 mg total) by mouth 2 (two) times daily.  . Lancets (ACCU-CHEK SOFT TOUCH) lancets 1 each by Other route as needed for other. Use as  instructed  DX E11.9  . lisinopril (ZESTRIL) 40 MG tablet TAKE 1 TABLET BY MOUTH ONCE DAILY FOR BLOOD PRESSURE  . metFORMIN (GLUCOPHAGE) 500 MG tablet Take 1 tablet (500 mg total) by mouth 3 (three) times daily.  . rosuvastatin (CRESTOR) 5 MG tablet Take 5 mg by mouth daily. (Patient not taking: Reported on 10/07/2020)   No facility-administered encounter medications on file as of 10/07/2020.     Objective:   PHYSICAL EXAMINATION:    VITALS:   Vitals:   10/07/20 1257  BP: 116/60  Pulse: (!) 101  SpO2: 99%  Weight: 126 lb (57.2 kg)  Height: 5\' 5"  (1.651 m)    GEN:  The patient appears  stated age and is in NAD. HEENT:  Normocephalic, atraumatic.  The mucous membranes are moist. The superficial temporal arteries are without ropiness or tenderness. CV: Tachycardic.  Regular. Lungs:  CTAB Neck/HEME:  There are no carotid bruits bilaterally.  Neurological examination:  Orientation: The patient is alert and oriented to person and place.  Her granddaughter supplies most of the history. Cranial nerves: There is good facial symmetry.  Extraocular muscles are intact. The visual fields are full to confrontational testing. The speech is fluent and clear but lacks spontaneity. Soft palate rises symmetrically and there is no tongue deviation. Hearing is intact to conversational tone. Sensation: Sensation is intact to light touch throughout (facial, trunk, extremities).  Motor: Strength is 5/5 in the bilateral upper and lower extremities.   Shoulder shrug is equal and symmetric.  There is no pronator drift.  Movement examination: Tone: There is normal tone in the bilateral upper extremities.  The tone in the lower extremities is normal.  Abnormal movements: no tremor is noted.  This somewhat athetotic/somewhat floating movements of the right arm are not noted this visit as they were a few days ago.  However, she does have some negative myoclonus on the right. Coordination:  There is no decremation with RAM's Gait and Station: The patient pushes off of the chair to arise.  She ambulates fairly well with a walker.      Total time spent on today's visit was 35 minutes, including both face-to-face time and nonface-to-face time.  Time included that spent on review of records (prior notes available to me/labs/imaging if pertinent), discussing treatment and goals, answering patient's questions and coordinating care.  Cc:  Reed, Tiffany L, DO

## 2020-10-05 NOTE — Telephone Encounter (Signed)
Daughter notified and order has been placed.

## 2020-10-06 ENCOUNTER — Ambulatory Visit: Payer: Medicare Other | Admitting: Family

## 2020-10-06 ENCOUNTER — Ambulatory Visit
Admission: RE | Admit: 2020-10-06 | Discharge: 2020-10-06 | Disposition: A | Discharge status: Home / Self Care | Payer: Medicare Other | Source: Ambulatory Visit | Attending: Neurology | Admitting: Neurology

## 2020-10-06 ENCOUNTER — Other Ambulatory Visit: Payer: Self-pay

## 2020-10-06 ENCOUNTER — Telehealth: Payer: Self-pay | Admitting: *Deleted

## 2020-10-06 DIAGNOSIS — M62838 Other muscle spasm: Secondary | ICD-10-CM

## 2020-10-06 DIAGNOSIS — R2681 Unsteadiness on feet: Secondary | ICD-10-CM

## 2020-10-06 DIAGNOSIS — R29898 Other symptoms and signs involving the musculoskeletal system: Secondary | ICD-10-CM

## 2020-10-06 DIAGNOSIS — R930 Abnormal findings on diagnostic imaging of skull and head, not elsewhere classified: Secondary | ICD-10-CM | POA: Diagnosis not present

## 2020-10-06 DIAGNOSIS — R251 Tremor, unspecified: Secondary | ICD-10-CM

## 2020-10-06 MED ORDER — GADOBENATE DIMEGLUMINE 529 MG/ML IV SOLN
10.0000 mL | Freq: Once | INTRAVENOUS | Status: AC | PRN
  Administered 2020-10-06: 10 mL via INTRAVENOUS

## 2020-10-06 NOTE — Telephone Encounter (Signed)
[facility] with [facility] called requesting verbal orders for OT and Speech to evaluate and Treat.  Verbal orders given.

## 2020-10-06 NOTE — Telephone Encounter (Signed)
Patient stopped in office and stated that [facility] [phone] will not take the order that Dr. Renato Gails gave them for a Rolling Walker.   Stated that the order has to say "Rollator" and have a ICD10 Code.   Pended Order for approval.   Patient will pick up order once signed tomorrow from office.

## 2020-10-06 NOTE — Telephone Encounter (Signed)
Order signed.  At the appt, they wanted a lightweight walker so that's why I ordered rolling rather than rollator, but I'm fine with whatever they want to get.

## 2020-10-06 NOTE — Telephone Encounter (Signed)
Placed order in Dr. Ernest Mallick folder to sign.

## 2020-10-07 ENCOUNTER — Ambulatory Visit (INDEPENDENT_AMBULATORY_CARE_PROVIDER_SITE_OTHER): Payer: Medicare Other | Admitting: Neurology

## 2020-10-07 ENCOUNTER — Encounter: Payer: Self-pay | Admitting: Neurology

## 2020-10-07 VITALS — BP 116/60 | HR 101 | Ht 65.0 in | Wt 126.0 lb

## 2020-10-07 DIAGNOSIS — R569 Unspecified convulsions: Secondary | ICD-10-CM

## 2020-10-07 MED ORDER — LEVETIRACETAM 250 MG PO TABS: 250.0000 mg | ORAL_TABLET | Freq: Two times a day (BID) | ORAL | 1 refills | Status: DC

## 2020-10-09 ENCOUNTER — Emergency Department (HOSPITAL_COMMUNITY): Payer: Medicare Other

## 2020-10-09 ENCOUNTER — Other Ambulatory Visit: Payer: Self-pay

## 2020-10-09 ENCOUNTER — Encounter (HOSPITAL_COMMUNITY): Payer: Self-pay

## 2020-10-09 ENCOUNTER — Inpatient Hospital Stay (HOSPITAL_COMMUNITY)
Admission: EM | Admit: 2020-10-09 | Discharge: 2020-10-13 | DRG: 100 | Disposition: A | Discharge status: Hospice | Payer: Medicare Other | Attending: Internal Medicine | Admitting: Internal Medicine

## 2020-10-09 DIAGNOSIS — G4089 Other seizures: Principal | ICD-10-CM | POA: Diagnosis present

## 2020-10-09 DIAGNOSIS — Z7984 Long term (current) use of oral hypoglycemic drugs: Secondary | ICD-10-CM | POA: Diagnosis not present

## 2020-10-09 DIAGNOSIS — Z20822 Contact with and (suspected) exposure to covid-19: Secondary | ICD-10-CM | POA: Diagnosis present

## 2020-10-09 DIAGNOSIS — Z833 Family history of diabetes mellitus: Secondary | ICD-10-CM

## 2020-10-09 DIAGNOSIS — R569 Unspecified convulsions: Secondary | ICD-10-CM | POA: Diagnosis not present

## 2020-10-09 DIAGNOSIS — G9340 Encephalopathy, unspecified: Secondary | ICD-10-CM | POA: Diagnosis not present

## 2020-10-09 DIAGNOSIS — Z8669 Personal history of other diseases of the nervous system and sense organs: Secondary | ICD-10-CM | POA: Diagnosis not present

## 2020-10-09 DIAGNOSIS — Z66 Do not resuscitate: Secondary | ICD-10-CM | POA: Diagnosis present

## 2020-10-09 DIAGNOSIS — Z7189 Other specified counseling: Secondary | ICD-10-CM

## 2020-10-09 DIAGNOSIS — R1110 Vomiting, unspecified: Secondary | ICD-10-CM | POA: Diagnosis not present

## 2020-10-09 DIAGNOSIS — R531 Weakness: Secondary | ICD-10-CM | POA: Diagnosis not present

## 2020-10-09 DIAGNOSIS — N179 Acute kidney failure, unspecified: Secondary | ICD-10-CM | POA: Diagnosis present

## 2020-10-09 DIAGNOSIS — I10 Essential (primary) hypertension: Secondary | ICD-10-CM | POA: Diagnosis not present

## 2020-10-09 DIAGNOSIS — Z7982 Long term (current) use of aspirin: Secondary | ICD-10-CM | POA: Diagnosis not present

## 2020-10-09 DIAGNOSIS — K5790 Diverticulosis of intestine, part unspecified, without perforation or abscess without bleeding: Secondary | ICD-10-CM | POA: Diagnosis not present

## 2020-10-09 DIAGNOSIS — R4020 Unspecified coma: Secondary | ICD-10-CM | POA: Diagnosis not present

## 2020-10-09 DIAGNOSIS — R32 Unspecified urinary incontinence: Secondary | ICD-10-CM | POA: Diagnosis not present

## 2020-10-09 DIAGNOSIS — Z79899 Other long term (current) drug therapy: Secondary | ICD-10-CM | POA: Diagnosis not present

## 2020-10-09 DIAGNOSIS — Z741 Need for assistance with personal care: Secondary | ICD-10-CM | POA: Diagnosis not present

## 2020-10-09 DIAGNOSIS — E1169 Type 2 diabetes mellitus with other specified complication: Secondary | ICD-10-CM | POA: Diagnosis not present

## 2020-10-09 DIAGNOSIS — Z515 Encounter for palliative care: Secondary | ICD-10-CM

## 2020-10-09 DIAGNOSIS — R410 Disorientation, unspecified: Secondary | ICD-10-CM | POA: Diagnosis not present

## 2020-10-09 DIAGNOSIS — G9341 Metabolic encephalopathy: Secondary | ICD-10-CM | POA: Diagnosis present

## 2020-10-09 DIAGNOSIS — I129 Hypertensive chronic kidney disease with stage 1 through stage 4 chronic kidney disease, or unspecified chronic kidney disease: Secondary | ICD-10-CM | POA: Diagnosis not present

## 2020-10-09 DIAGNOSIS — Z7401 Bed confinement status: Secondary | ICD-10-CM | POA: Diagnosis not present

## 2020-10-09 DIAGNOSIS — E785 Hyperlipidemia, unspecified: Secondary | ICD-10-CM | POA: Diagnosis present

## 2020-10-09 DIAGNOSIS — M2550 Pain in unspecified joint: Secondary | ICD-10-CM | POA: Diagnosis not present

## 2020-10-09 DIAGNOSIS — R4701 Aphasia: Secondary | ICD-10-CM | POA: Diagnosis present

## 2020-10-09 DIAGNOSIS — E1122 Type 2 diabetes mellitus with diabetic chronic kidney disease: Secondary | ICD-10-CM | POA: Diagnosis not present

## 2020-10-09 DIAGNOSIS — R059 Cough, unspecified: Secondary | ICD-10-CM | POA: Diagnosis not present

## 2020-10-09 DIAGNOSIS — R1312 Dysphagia, oropharyngeal phase: Secondary | ICD-10-CM | POA: Diagnosis not present

## 2020-10-09 DIAGNOSIS — E1140 Type 2 diabetes mellitus with diabetic neuropathy, unspecified: Secondary | ICD-10-CM | POA: Diagnosis present

## 2020-10-09 DIAGNOSIS — D696 Thrombocytopenia, unspecified: Secondary | ICD-10-CM | POA: Diagnosis present

## 2020-10-09 DIAGNOSIS — E559 Vitamin D deficiency, unspecified: Secondary | ICD-10-CM | POA: Diagnosis not present

## 2020-10-09 DIAGNOSIS — N183 Chronic kidney disease, stage 3 unspecified: Secondary | ICD-10-CM | POA: Diagnosis not present

## 2020-10-09 DIAGNOSIS — R159 Full incontinence of feces: Secondary | ICD-10-CM | POA: Diagnosis not present

## 2020-10-09 DIAGNOSIS — K5792 Diverticulitis of intestine, part unspecified, without perforation or abscess without bleeding: Secondary | ICD-10-CM | POA: Diagnosis not present

## 2020-10-09 LAB — CBC
HCT: 41.7 % (ref 36.0–46.0)
Hemoglobin: 13.4 g/dL (ref 12.0–15.0)
MCH: 29.1 pg (ref 26.0–34.0)
MCHC: 32.1 g/dL (ref 30.0–36.0)
MCV: 90.7 fL (ref 80.0–100.0)
Platelets: 150 10*3/uL (ref 150–400)
RBC: 4.6 MIL/uL (ref 3.87–5.11)
RDW: 13 % (ref 11.5–15.5)
WBC: 8.1 10*3/uL (ref 4.0–10.5)
nRBC: 0 % (ref 0.0–0.2)

## 2020-10-09 LAB — CBG MONITORING, ED
Glucose-Capillary: 145 mg/dL — ABNORMAL HIGH (ref 70–99)
Glucose-Capillary: 118 mg/dL — ABNORMAL HIGH (ref 70–99)

## 2020-10-09 LAB — LIPASE, BLOOD: Lipase: 28 U/L (ref 11–51)

## 2020-10-09 LAB — URINALYSIS, ROUTINE W REFLEX MICROSCOPIC
Bilirubin Urine: NEGATIVE
Glucose, UA: NEGATIVE mg/dL
Hgb urine dipstick: NEGATIVE
Ketones, ur: 20 mg/dL — AB
Nitrite: NEGATIVE
Protein, ur: 30 mg/dL — AB
Specific Gravity, Urine: 1.026 (ref 1.005–1.030)
pH: 5 (ref 5.0–8.0)

## 2020-10-09 LAB — COMPREHENSIVE METABOLIC PANEL
ALT: 16 U/L (ref 0–44)
AST: 40 U/L (ref 15–41)
Albumin: 4.4 g/dL (ref 3.5–5.0)
Alkaline Phosphatase: 45 U/L (ref 38–126)
Anion gap: 15 (ref 5–15)
BUN: 39 mg/dL — ABNORMAL HIGH (ref 8–23)
CO2: 22 mmol/L (ref 22–32)
Calcium: 10.3 mg/dL (ref 8.9–10.3)
Chloride: 100 mmol/L (ref 98–111)
Creatinine, Ser: 1.61 mg/dL — ABNORMAL HIGH (ref 0.44–1.00)
GFR, Estimated: 32 mL/min — ABNORMAL LOW (ref >60)
Glucose, Bld: 148 mg/dL — ABNORMAL HIGH (ref 70–99)
Potassium: 3.9 mmol/L (ref 3.5–5.1)
Sodium: 137 mmol/L (ref 135–145)
Total Bilirubin: 0.8 mg/dL (ref 0.3–1.2)
Total Protein: 8 g/dL (ref 6.5–8.1)

## 2020-10-09 LAB — MAGNESIUM: Magnesium: 1.8 mg/dL (ref 1.7–2.4)

## 2020-10-09 LAB — TSH: TSH: 1.715 u[IU]/mL (ref 0.350–4.500)

## 2020-10-09 LAB — SARS CORONAVIRUS 2 BY RT PCR (HOSPITAL ORDER, PERFORMED IN ~~LOC~~ HOSPITAL LAB): SARS Coronavirus 2: NEGATIVE

## 2020-10-09 MED ORDER — LORAZEPAM 2 MG/ML IJ SOLN
1.0000 mg | INTRAMUSCULAR | Status: DC | PRN
  Administered 2020-10-12: 1 mg via INTRAVENOUS
  Filled 2020-10-09: qty 1

## 2020-10-09 MED ORDER — LEVETIRACETAM IN NACL 1000 MG/100ML IV SOLN
1000.0000 mg | Freq: Once | INTRAVENOUS | Status: AC
  Administered 2020-10-09: 1000 mg via INTRAVENOUS
  Filled 2020-10-09: qty 100

## 2020-10-09 MED ORDER — ACETAMINOPHEN 650 MG RE SUPP: 650.0000 mg | Freq: Four times a day (QID) | RECTAL | Status: DC | PRN

## 2020-10-09 MED ORDER — ENOXAPARIN SODIUM 30 MG/0.3ML ~~LOC~~ SOLN
30.0000 mg | SUBCUTANEOUS | Status: DC
  Administered 2020-10-10 (×2): 30 mg via SUBCUTANEOUS
  Filled 2020-10-09 (×2): qty 0.3

## 2020-10-09 MED ORDER — LEVETIRACETAM IN NACL 500 MG/100ML IV SOLN
500.0000 mg | Freq: Two times a day (BID) | INTRAVENOUS | Status: DC
  Administered 2020-10-09 – 2020-10-10 (×2): 500 mg via INTRAVENOUS
  Filled 2020-10-09 (×3): qty 100

## 2020-10-09 MED ORDER — LACTATED RINGERS IV BOLUS
250.0000 mL | Freq: Once | INTRAVENOUS | Status: AC
  Administered 2020-10-09: 250 mL via INTRAVENOUS

## 2020-10-09 MED ORDER — LACTATED RINGERS IV SOLN: INTRAVENOUS | Status: DC

## 2020-10-09 MED ORDER — LEVETIRACETAM 500 MG PO TABS: 500.0000 mg | ORAL_TABLET | Freq: Two times a day (BID) | ORAL | Status: DC

## 2020-10-09 MED ORDER — ACETAMINOPHEN 325 MG PO TABS: 650.0000 mg | ORAL_TABLET | Freq: Four times a day (QID) | ORAL | Status: DC | PRN

## 2020-10-09 MED ORDER — INSULIN ASPART 100 UNIT/ML ~~LOC~~ SOLN: 0.0000 [IU] | SUBCUTANEOUS | Status: DC

## 2020-10-09 MED ORDER — LABETALOL HCL 5 MG/ML IV SOLN
5.0000 mg | INTRAVENOUS | Status: DC | PRN
  Administered 2020-10-10: 5 mg via INTRAVENOUS
  Filled 2020-10-09: qty 4

## 2020-10-09 MED ORDER — ONDANSETRON HCL 4 MG PO TABS: 4.0000 mg | ORAL_TABLET | Freq: Four times a day (QID) | ORAL | Status: DC | PRN

## 2020-10-09 MED ORDER — ONDANSETRON HCL 4 MG/2ML IJ SOLN
4.0000 mg | Freq: Four times a day (QID) | INTRAMUSCULAR | Status: DC | PRN
  Administered 2020-10-09: 4 mg via INTRAVENOUS
  Filled 2020-10-09: qty 2

## 2020-10-09 NOTE — ED Notes (Signed)
Neuro at  The bedside

## 2020-10-09 NOTE — ED Provider Notes (Signed)
[facility] EMERGENCY DEPARTMENT Provider Note   CSN: [id number] Arrival date & time: 10/09/20  8502     History No chief complaint on file.   Jamila Slatten is a 86 y.o. female with a past medical history of DM Barr in her 80s, DM, hyperlipidemia, hypertension, who presents today for evaluation after vomiting this morning. History is primarily obtained from patient's daughter who is in the room along with chart review.  Patient was seen 10/07/2020 by Dr. Arbutus Leas with neurology.  Originally on 09/21/2020 patient came to the emergency room however left prior to being seen.  Per triage note at that point she had had 4 days of memory loss, trembling, unsteady and slurring her speech intermittently.  Patient had labs drawn that day which were followed up on by PCP the next day.  At that point she had mild hyponatremia.  She then came to the emergency room 1/22 for intermittent generalized tremors.  She again had followed up with neurology and PCP.   When asked patient's daughter what is changed since when she saw the neurologist 2 days ago she reports that patient has continued to vomit.  This vomiting started before the patient was started on Keppra.  She reports that the patient vomited for about half an hour this morning and then she has not attempted to have patient eat or drink anything since due to concerns that she may vomit again.  It appears that they have been refusing LP and it is unclear what exactly is causing all of patient's symptoms.  I discussed overall goals of care with patient and her daughter who is present.  They do not want LP, they do not want SNF placement despite patient being increasingly unable to perform ADLs and requiring increased care from the daughter.    Daughter feels like patient is becoming increasingly confused since this started 2 weeks ago.  Daughter denies any fevers.  No diarrhea or constipation.  Patient has not been reporting abdominal  pain.  Per A/P from Dr. Arbutus Leas on 10/07/20  "1.  Seizure (focal)             -Patient's MRI brain with evidence of cortical ribboning.  Suspect that this was a postictal phenomenon but the cause of the seizure is to be determined.  I am going to go ahead and start the patient on Keppra, 250 mg twice per day.  This is a very low dose, but we will see if she tolerates it and if it helps symptoms.  R/B/SE were discussed.  The opportunity to ask questions was given and they were answered to the best of my ability.  The patient expressed understanding and willingness to follow the outlined treatment protocols.             -do 48 hour ambulatory EEG             -Discussed possibility of CJD disease.  Discussed lumbar puncture.  Granddaughter really does not want to do this right now.  States that does not think patient will tolerate it (has noted behavioral aggression).  In addition, she just does not want to put her grandmother through that right now.  She will let me know if they change their mind and we can certainly schedule it.  Given the negative myoclonus seen last visit and the choreoathetotic movements on the right last visit, I think that this does need to be in the differential, even though it is quite rare.  What would go against it, is the very acute development of the symptoms.  Granddaughter understands and will discuss with family as well.             -I want to see patient back 3 weeks after EEG completed.  Granddaughter to call me if SE with keppra or if doesn't seem to help (too low dose)"   HPI     Past Medical History:  Diagnosis Date  . Diabetes mellitus   . Guillain Barr syndrome (HCC)   . Hyperlipidemia   . Hypertension   . Myalgia and myositis, unspecified   . 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 Vitamin D deficiency     Patient Active Problem List   Diagnosis Date Noted  . AKI (acute kidney injury) (HCC) 10/09/2020  . Seizures (HCC) 10/09/2020  . Acute  encephalopathy 10/09/2020  . Diverticulosis 12/12/2018  . History of Guillain-Barre syndrome 06/01/2016  . Diverticulitis 11/06/2013  . Type 2 diabetes mellitus with other specified complication (HCC) 12/05/2012  . Hyperlipidemia associated with type 2 diabetes mellitus (HCC) 12/05/2012  . Essential hypertension, benign 12/05/2012    Past Surgical History:  Procedure Laterality Date  . 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     Family History  Problem Relation Age of Onset  . Heart failure Mother   . Diabetes Mother   . Alzheimer's disease Sister   . Drug abuse Child     Social History   Tobacco Use  . Smoking status: Never Smoker  . Smokeless tobacco: Never Used  Vaping Use  . Vaping Use: Never used  Substance Use Topics  . Alcohol use: No    Alcohol/week: 0.0 standard drinks  . Drug use: No    Home Medications Prior to Admission medications   Medication Sig Start Date End Date Taking? Authorizing Provider  aspirin 81 MG tablet Take 81 mg by mouth daily.   Yes [provider]  Cranberry 475 MG CAPS Take 1 capsule (475 mg total) by mouth 2 (two) times daily. 09/13/20 10/13/20 Yes Ngetich, Dinah C, NP  levETIRAcetam (KEPPRA) 250 MG tablet Take 1 tablet (250 mg total) by mouth 2 (two) times daily. 10/07/20  Yes Tat, Octaviano Batty, DO  lisinopril (ZESTRIL) 40 MG tablet TAKE 1 TABLET BY MOUTH ONCE DAILY FOR BLOOD PRESSURE 09/15/20  Yes Reed, Tiffany L, DO  metFORMIN (GLUCOPHAGE) 500 MG tablet Take 1 tablet (500 mg total) by mouth 3 (three) times daily. 09/15/20  Yes Reed, Tiffany L, DO  ACCU-CHEK GUIDE test strip USE 1-2 TIMES DAILY AS NEEDED 02/04/20   Reed, Tiffany L, DO  Lancets (ACCU-CHEK SOFT TOUCH) lancets 1 each by Other route as needed for other. Use as instructed  DX E11.9 03/18/19   Ngetich, Donalee Citrin, NP    Allergies    Patient has no known allergies.  Review of Systems   Review  of Systems  Unable to perform ROS: Mental status change    Physical Exam Updated Vital Signs BP (!) 180/72   Pulse 84   Temp 98.8 F (37.1 C)   Resp 20   SpO2 100%   Physical Exam Vitals and nursing note reviewed.  Constitutional:      General: She is not in acute distress.    Appearance: She is not diaphoretic.  HENT:     Head: Normocephalic and atraumatic.  Eyes:  General: No scleral icterus.       Right eye: No discharge.        Left eye: No discharge.     Conjunctiva/sclera: Conjunctivae normal.  Cardiovascular:     Rate and Rhythm: Normal rate and regular rhythm.     Pulses: Normal pulses.     Heart sounds: Normal heart sounds.  Pulmonary:     Effort: Pulmonary effort is normal. No respiratory distress.     Breath sounds: No stridor.  Abdominal:     General: There is no distension.     Tenderness: There is no abdominal tenderness. There is no guarding.  Musculoskeletal:        General: No deformity.     Cervical back: Normal range of motion and neck supple.     Right lower leg: No edema.     Left lower leg: No edema.  Skin:    General: Skin is warm and dry.  Neurological:     Mental Status: She is alert.     Motor: No abnormal muscle tone.     Comments: RUE with frequent abnormal movements primarily at the elbow and wrist.  RUE is not rigid.  5/5 grip strength bilaterally.  She is able to lift bilateral legs off the bed. Patient is oriented to person, not to place or time, does not know who her daughter is and tells me that that is her mother.  Psychiatric:        Behavior: Behavior normal.     ED Results / Procedures / Treatments   Labs (all labs ordered are listed, but only abnormal results are displayed) Labs Reviewed  COMPREHENSIVE METABOLIC PANEL - Abnormal; Notable for the following components:      Result Value   Glucose, Bld 148 (*)    BUN 39 (*)    Creatinine, Ser 1.61 (*)    GFR, Estimated 32 (*)    All other components within normal  limits  URINALYSIS, ROUTINE W REFLEX MICROSCOPIC - Abnormal; Notable for the following components:   Color, Urine AMBER (*)    APPearance HAZY (*)    Ketones, ur 20 (*)    Protein, ur 30 (*)    Leukocytes,Ua TRACE (*)    Bacteria, UA RARE (*)    All other components within normal limits  CBG MONITORING, ED - Abnormal; Notable for the following components:   Glucose-Capillary 145 (*)    All other components within normal limits  CBG MONITORING, ED - Abnormal; Notable for the following components:   Glucose-Capillary 118 (*)    All other components within normal limits  SARS CORONAVIRUS 2 BY RT PCR (HOSPITAL ORDER, PERFORMED IN [facility])  LIPASE, BLOOD  CBC  MAGNESIUM  TSH  CBC  BASIC METABOLIC PANEL    EKG EKG Interpretation  Date/Time:  Saturday October 09 2020 19:25:48 EST Ventricular Rate:  88 PR Interval:    QRS Duration: 82 QT Interval:  379 QTC Calculation: 459 R Axis:   35 Text Interpretation: Sinus rhythm Abnormal R-wave progression, early transition When compared with ECG of 09/21/2020, No significant change was found Confirmed by Dione Booze ([id number]) on 10/09/2020 11:32:23 PM   Radiology DG Chest Port 1 View  Result Date: 10/09/2020 CLINICAL DATA:  Cough, weakness, vomiting EXAM: PORTABLE CHEST 1 VIEW COMPARISON:  11/07/2011 FINDINGS: Heart and mediastinal contours are within normal limits. No focal opacities or effusions. No acute bony abnormality. IMPRESSION: No active disease. Electronically Signed   By: Caryn Bee  Dover M.D.   On: 10/09/2020 19:28    Procedures Procedures   Medications Ordered in ED Medications  lactated ringers bolus 250 mL (0 mLs Intravenous Stopped 10/09/20 2240)    ED Course  I have reviewed the triage vital signs and the nursing notes.  Pertinent labs & imaging results that were available during my care of the patient were reviewed by me and considered in my medical decision making (see chart for details).    MDM  Rules/Calculators/A&P                         Patient is a 86 year old woman who presents today with her daughter for evaluation of 2 weeks of ongoing confusion.  She has had extensive outpatient work-up in this time including seeing neurology, having MRIs.  Her daughter primarily brought her in today due to patient's condition declining, concern for dehydration and patient vomiting earlier today.  Here her labs show AKI with a creatinine of 1.61.  Urine does show rare bacteria however is not clearly infected.  Covid test is negative.  TSH and magnesium are normal.  Patient did become hypertensive while in the emergency room.  Neurology will see the patient in consult.  No indication for repeat imaging at this time as she has recently had MRIs.  Dr. Julian Reil will see patient for admission.   The patient appears reasonably stabilized for admission considering the current resources, flow, and capabilities available in the ED at this time, and I doubt any other [facility] requiring further screening and/or treatment in the ED prior to admission assuming timely admission and bed placement.  Note: Portions of this report may have been transcribed using voice recognition software. Every effort was made to ensure accuracy; however, inadvertent computerized transcription errors may be present  Final Clinical Impression(s) / ED Diagnoses Final diagnoses:  AKI (acute kidney injury) (HCC)  Acute encephalopathy  Seizures [facility])    Rx / DC Orders ED Discharge Orders    None       Norman Clay 10/09/20 2349    Tilden Fossa, MD 10/10/20 1731

## 2020-10-09 NOTE — ED Triage Notes (Signed)
Patient arrived by POV with complaints of increased weakness and emesis. Patient started on Keppra yesterday by neurology. Patient alert to baseline. No emesis on arrival.

## 2020-10-09 NOTE — ED Notes (Signed)
Pt has been here in the ed since 0900am  History of seizures  She makes eye contact but no verbal response

## 2020-10-09 NOTE — H&P (Signed)
History and Physical    Jocelyn Hendrix XFG:[id number] DOB: March 01, 1935 DOA: 10/09/2020  PCP: Jocelyn Balo, DO  Patient coming from: Home  I have personally briefly reviewed patient's old medical records in [facility]  Chief Complaint: Seizures  HPI: Jocelyn Hendrix is a 86 y.o. female with medical history significant of DM2, prior GBS in her 62s, HLD, HTN.  Pt functional and independent until about 2 weeks ago.  Woke up on 1/17 with blood in mouth, didn't seem herself and was withdrawn.  Couldn't do much with R hand even though she is R handed.  R hand with tremor.  Went to ED, but extended wait time so left.  Continued to decline since then.  Back to ER on 1/22 with "trembling" of R hand and R leg.  Continued to decline since, more confused, withdrawn, doesn't recognize family, more trouble with word finding.  Saw neurology as outpt in past couple of days, after work up including MRI brain W and W/O contrast, EEG, no LP.  Concern for focal seizures and post-ictal state, DDx Hypoperfusion and CJD.  Started on Keppra 250mg  BID with plan to up titrate and do 48h ambulatory EEG.  Vomiting for last 2-3 days started prior to starting keppra.  Brought in to ED today.  Pt is verbal, confused, not really able to contribute much to history.  Denies headache or neck pain.   ED Course: Creat 1.6, BUN 39 up from 1.0 on 1/26.  Neuro consulted, hospitalist asked to admit.   Review of Systems: Unable to perform due to AMS  Past Medical History:  Diagnosis Date  . Diabetes mellitus   . Guillain Barr syndrome (HCC)   . Hyperlipidemia   . Hypertension   . Myalgia and myositis, unspecified   . 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 Vitamin D deficiency     Past Surgical History:  Procedure Laterality Date  . 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 She has never used smokeless tobacco. She  reports that she does not drink alcohol and does not use drugs.  No Known Allergies  Family History  Problem Relation Age of Onset  . Heart failure Mother   . Diabetes Mother   . Alzheimer's disease Sister   . Drug abuse Child      Prior to Admission medications   Medication Sig Start Date End Date Taking? Authorizing Provider  aspirin 81 MG tablet Take 81 mg by mouth daily.   Yes [provider]  Cranberry 475 MG CAPS Take 1 capsule (475 mg total) by mouth 2 (two) times daily. 09/13/20 10/13/20 Yes Ngetich, Dinah C, NP  levETIRAcetam (KEPPRA) 250 MG tablet Take 1 tablet (250 mg total) by mouth 2 (two) times daily. 10/07/20  Yes Tat, 12/05/20, DO  lisinopril (ZESTRIL) 40 MG tablet TAKE 1 TABLET BY MOUTH ONCE DAILY FOR BLOOD PRESSURE 09/15/20  Yes Reed, Tiffany L, DO  metFORMIN (GLUCOPHAGE) 500 MG tablet Take 1 tablet (500 mg total) by mouth 3 (three) times daily. 09/15/20  Yes Reed, Tiffany L, DO  ACCU-CHEK GUIDE test strip USE 1-2 TIMES DAILY AS NEEDED 02/04/20   Reed, Tiffany L, DO  Lancets (ACCU-CHEK SOFT TOUCH) lancets 1 each by Other route as needed for other. Use as instructed  DX E11.9 03/18/19   Ngetich, 03/20/19, NP    Physical Exam: Vitals:   10/09/20 1901 10/09/20  1915 10/09/20 1930 10/09/20 1945  BP: (!) 182/80 (!) 190/97 (!) 172/81 (!) 169/77  Pulse: 87 93 100 87  Resp: 18 (!) 21 18 20   Temp:      SpO2: 100% 100% 98% 100%    Constitutional: NAD, calm, comfortable Eyes: PERRL, lids and conjunctivae normal ENMT: Mucous membranes are moist. Posterior pharynx clear of any exudate or lesions.Normal dentition.  Neck: normal, supple, no masses, no thyromegaly Respiratory: clear to auscultation bilaterally, no wheezing, no crackles. Normal respiratory effort. No accessory muscle use.  Cardiovascular: Regular rate and rhythm, no murmurs / rubs / gallops. No extremity edema. 2+ pedal pulses. No carotid bruits.  Abdomen: no tenderness, no masses palpated. No  hepatosplenomegaly. Bowel sounds positive.  Musculoskeletal: no clubbing / cyanosis. No joint deformity upper and lower extremities. Good ROM, no contractures. Normal muscle tone.  Skin: no rashes, lesions, ulcers. No induration Neurologic: Mildly decreased sensation in RUE/RLE to touch.  Finger to nose ataxic on R.  Tone increased on R, RUE intermittently in flexion contracture. Psychiatric: Pleasantly Confused   Labs on Admission: I have personally reviewed following labs and imaging studies  CBC: Recent Labs  Lab 10/09/20 0900  WBC 8.1  HGB 13.4  HCT 41.7  MCV 90.7  PLT 150   Basic Metabolic Panel: Recent Labs  Lab 10/09/20 0900  NA 137  K 3.9  CL 100  CO2 22  GLUCOSE 148*  BUN 39*  CREATININE 1.61*  CALCIUM 10.3   GFR: Estimated Creatinine Clearance: 23.8 mL/min (A) (by C-G formula based on SCr of 1.61 mg/dL (H)). Liver Function Tests: Recent Labs  Lab 10/09/20 0900  AST 40  ALT 16  ALKPHOS 45  BILITOT 0.8  PROT 8.0  ALBUMIN 4.4   Recent Labs  Lab 10/09/20 0900  LIPASE 28   No results for input(s): AMMONIA in the last 168 hours. Coagulation Profile: No results for input(s): INR, PROTIME in the last 168 hours. Cardiac Enzymes: No results for input(s): CKTOTAL, CKMB, CKMBINDEX, TROPONINI in the last 168 hours. BNP (last 3 results) No results for input(s): PROBNP in the last 8760 hours. HbA1C: No results for input(s): HGBA1C in the last 72 hours. CBG: Recent Labs  Lab 10/09/20 0854  GLUCAP 145*   Lipid Profile: No results for input(s): CHOL, HDL, LDLCALC, TRIG, CHOLHDL, LDLDIRECT in the last 72 hours. Thyroid Function Tests: No results for input(s): TSH, T4TOTAL, FREET4, T3FREE, THYROIDAB in the last 72 hours. Anemia Panel: No results for input(s): VITAMINB12, FOLATE, FERRITIN, TIBC, IRON, RETICCTPCT in the last 72 hours. Urine analysis:    Component Value Date/Time   COLORURINE AMBER (A) 10/09/2020 1227   APPEARANCEUR HAZY (A) 10/09/2020  1227   LABSPEC 1.026 10/09/2020 1227   PHURINE 5.0 10/09/2020 1227   GLUCOSEU NEGATIVE 10/09/2020 1227   HGBUR NEGATIVE 10/09/2020 1227   BILIRUBINUR NEGATIVE 10/09/2020 1227   BILIRUBINUR Negative 09/22/2020 1142   KETONESUR 20 (A) 10/09/2020 1227   PROTEINUR 30 (A) 10/09/2020 1227   UROBILINOGEN negative (A) 09/22/2020 1142   UROBILINOGEN 1.0 08/01/2013 1656   NITRITE NEGATIVE 10/09/2020 1227   LEUKOCYTESUR TRACE (A) 10/09/2020 1227    Radiological Exams on Admission: DG Chest Port 1 View  Result Date: 10/09/2020 CLINICAL DATA:  Cough, weakness, vomiting EXAM: PORTABLE CHEST 1 VIEW COMPARISON:  11/07/2011 FINDINGS: Heart and mediastinal contours are within normal limits. No focal opacities or effusions. No acute bony abnormality. IMPRESSION: No active disease. Electronically Signed   By: 01/07/2012 M.D.  On: 10/09/2020 19:28    EKG: Independently reviewed.  Assessment/Plan Principal Problem:   Seizures (HCC) Active Problems:   Type 2 diabetes mellitus with other specified complication (HCC)   Essential hypertension, benign   AKI (acute kidney injury) (HCC)   Acute encephalopathy    1. Recurrent focal seizures - 1. DDx includes ischemia, CJD 2. See neuro consult note 3. EEG monitoring 4. Seizure precautions 5. Anti-seizure meds per neurology 6. Given recent N/V: 1. NPO for the moment at least overnight 2. PRN zofran 3. SLP eval in AM to make sure shes safe to swallow 7. Probably needs PT/OT after seizures brought under control 8. Tele monitor 9. Check Mg, Check TSH 2. AKI - 1. Suspect pre-renal / dehydration due to recent encephalopathy 2. IVF: 500cc bolus in ED then 100 cc/hr LR 3. Strict intake and output 4. Repeat BMP in AM 3. Encephalopathy - 1. Suspect ictal / post-ictal 2. DDx includes ischemia, CJD 3. Try fixing seizures (if present) first 4. DM2 - 1. Hold home meds 2. Sensitive SSI Q4H 5. HTN - 1. PRN labetalol if needed  DVT prophylaxis:  Lovenox Code Status: Full Code for now Family Communication: No family in room Disposition Plan: TBD Consults called: Dr. Derry Lory Admission status: Admit to inpatient  Severity of Illness: The appropriate patient status for this patient is INPATIENT. Inpatient status is judged to be reasonable and necessary in order to provide the required intensity of service to ensure the patient's safety. The patient's presenting symptoms, physical exam findings, and initial radiographic and laboratory data in the context of their chronic comorbidities is felt to place them at high risk for further clinical deterioration. Furthermore, it is not anticipated that the patient will be medically stable for discharge from the hospital within 2 midnights of admission. The following factors support the patient status of inpatient.   IP status due to acute encephalopathy, N/V, mild AKI, suspect recurrent partial seizures ongoing.  * I certify that at the point of admission it is my clinical judgment that the patient will require inpatient hospital care spanning beyond 2 midnights from the point of admission due to high intensity of service, high risk for further deterioration and high frequency of surveillance required.*    Ricquel Foulk M. DO [facility]  How to contact the [facility] Attending or Consulting provider 7A - 7P or covering provider during after hours 7P -7A, for this patient?  1. Check the care team in [facility] and look for a) attending/consulting [facility] provider listed and b) the [facility] team listed 2. Log into [email or web address]  Amion Physician Scheduling and messaging for groups and whole hospitals  On call and physician scheduling software for group practices, residents, hospitalists and other medical providers for call, clinic, rotation and shift schedules. [employer] is a hospital-wide system for scheduling doctors and paging doctors on call. EasyPlot is for scientific plotting and data analysis.   [email or web address]  and use [place]'s universal password to access. If you do not have the password, please contact the hospital operator.  3. Locate the [facility] provider you are looking for under [facility] and page to a number that you can be directly reached. 4. If you still have difficulty reaching the provider, please page the [facility] (Director on Call) for the Hospitalists listed on amion for assistance.  10/09/2020, 9:08 PM

## 2020-10-09 NOTE — Consult Note (Addendum)
NEUROLOGY CONSULTATION NOTE   Date of service: October 09, 2020 Patient Name: Jocelyn Hendrix MRN:  [id number] DOB:  04/29/1935 Reason for consult: "Seizures" _ _ _   _ __   _ __ _ _  __ __   _ __   __ _  History of Present Illness  Jocelyn Hendrix is a 86 y.o. female with PMH significant for DM2, prior hx of GBS, hx of HLD, HTN, neuropathy who presents with increased weakness and emesis.  Patient is confused and unable to provide any meaningful history. Most of the history obtained from daughter and grand daughter over the phone. Patient was functional and independent until about 2 weeks ago. She woke up on 09/20/20 with blood in her mouth. Did not seem herself and was withdrawn. She could not do much with her right hand even though she is right handed. The R hand was shaking. They took her to the ED but left after extended wait time. She has continued to decline since. They went back to the ER on 1/22 because of weakness on the R hand and leg and "trembling" of the R hand.  This has continued to progress since and she is more confused, withdrawn, does not recognize family and has been havin more and more trouble with finding the right words.  She had workup with rEEG which demonstrated Slowing and sharply contoured theta in the left hemisphere compared to that of the right. Workup with MRI Brain without contrast demosntrated restricted diffusion affecting the left caudate and the cortex of the left hemisphere, most notable in the ACA distribution, differential: post ictal, hypoperfusion, CJD. She has also had progression of small vessel disease since. Repeat MRI Brain with contrast demonstrated No abnormal enhancement.  Outpatient Neurologist, Dr. Arbutus Leas has seen her twice in the last week and started her on Keppra 250mg  BID with plan to uptitrate as needed along with plan to do an ambulatory 48 hours EEG. She has had vomiting for the last 2-3 days. This started prior to her starting of Keppra. Family  brought her in for not being able to eat and drink and she is admitted.  Patient denies any headache, neck pain. Family has not noticed and fever, no rash concerning for shingles.   ROS   Denies any pain, unable to get detailed ROS due to confusion and perseverating.  Past History   Past Medical History:  Diagnosis Date  . Diabetes mellitus   . Guillain Barr syndrome (HCC)   . Hyperlipidemia   . Hypertension   . Myalgia and myositis, unspecified   . 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 Vitamin D deficiency    Past Surgical History:  Procedure Laterality Date  . 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 Heart failure Mother   . Diabetes Mother   . Alzheimer's disease Sister   . Drug abuse Child    Social History   Socioeconomic History  . Marital status: Widowed    Spouse name: Not on file  . Number of children: Not on file  . Years of education: Not on file  . Highest education level: Not on file  Occupational History  . Not on file  Tobacco Use  . Smoking status: Never Smoker  . Smokeless tobacco: Never Used  Vaping Use  . Vaping Use: Never used  Substance and Sexual Activity  . Alcohol use: No  Alcohol/week: 0.0 standard drinks  . Drug use: No  . Sexual activity: Not Currently    Birth control/protection: Post-menopausal  Other Topics Concern  . Not on file  Social History Narrative  . Not on file   Social Determinants of Health   Financial Resource Strain: Not on file  Food Insecurity: Not on file  Transportation Needs: Not on file  Physical Activity: Not on file  Stress: Not on file  Social Connections: Not on file   No Known Allergies  Medications  (Not in a hospital admission)    Vitals   Vitals:   10/09/20 1645 10/09/20 1700 10/09/20 1745 10/09/20 1901  BP: (!) 155/74 (!) 153/77 (!) 159/83 (!) 182/80  Pulse: 85 88 86 87  Resp: 16  (!) 21 19 18   Temp:      SpO2: 100% 99% 100% 100%     There is no height or weight on file to calculate BMI.  Physical Exam   General: Laying comfortably in bed; in no acute distress. HENT: Normal oropharynx and mucosa. Normal external appearance of ears and nose. Neck: Supple, no pain or tenderness CV: No JVD. No peripheral edema. Pulmonary: Symmetric Chest rise. Normal respiratory effort. Abdomen: Soft to touch, non-tender. Ext: No cyanosis, edema, or deformity Skin: No rash. Normal palpation of skin.  Musculoskeletal: Normal digits and nails by inspection. No clubbing.  Neurologic Examination  Mental status/Cognition: Alert, oriented to self, place, poor attention.  Speech/language: Fluent, comprehension intact, able to name some objects but perseverates at times, able repeat. Cranial nerves:   CN II Pupils equal and reactive to light, no VF deficits   CN III,IV,VI EOM intact, no gaze preference or deviation, no nystagmus    CN V normal sensation in V1, V2, and V3 segments bilaterally   CN VII no asymmetry, no nasolabial fold flattening   CN VIII normal hearing to speech   CN IX & X normal palatal elevation, no uvular deviation   CN XI 5/5 head turn and 5/5 shoulder shrug bilaterally   CN XII midline tongue protrusion    Motor:  Muscle bulk: normal, tone increased in RUE, no twitching thou. RUE intermittently in flexion contracture.  Mvmt Root Nerve  Muscle Right Left Comments  SA C5/6 Ax Deltoid 5 5   EF C5/6 Mc Biceps 5 5   EE C6/7/8 Rad Triceps 5 5   WF C6/7 Med FCR 5 5   WE C7/8 PIN ECU 5 5   F Ab C8/T1 U ADM/FDI 5 5   HF L1/2/3 Fem Illopsoas 5 5   KE L2/3/4 Fem Quad 5 5   DF L4/5 D Peron Tib Ant 5 5   PF S1/2 Tibial Grc/Sol 5 5    Reflexes:  Right Left Comments  Pectoralis      Biceps (C5/6) 2 2   Brachioradialis (C5/6) 2 2    Triceps (C6/7) 2 2    Patellar (L3/4) 2 2    Achilles (S1)      Hoffman      Plantar     Jaw jerk    Sensation:  Light  touch Decreased mildly in RUE and RLE to touch   Pin prick    Temperature    Vibration   Proprioception    Coordination/Complex Motor:  - Finger to Nose intact on the left, ataxic on the right. - Heel to shin unable to do. - Rapid alternating movement are slowed in RUE - Gait: Deferred.  Labs  CBC:  Recent Labs  Lab 10/09/20 0900  WBC 8.1  HGB 13.4  HCT 41.7  MCV 90.7  PLT 150    Basic Metabolic Panel:  Lab Results  Component Value Date   NA 137 10/09/2020   K 3.9 10/09/2020   CO2 22 10/09/2020   GLUCOSE 148 (H) 10/09/2020   BUN 39 (H) 10/09/2020   CREATININE 1.61 (H) 10/09/2020   CALCIUM 10.3 10/09/2020   GFRNONAA 32 (L) 10/09/2020   GFRAA 56 (L) 09/29/2020   Lipid Panel:  Lab Results  Component Value Date   LDLCALC 131 (H) 02/09/2020   HgbA1c:  Lab Results  Component Value Date   HGBA1C 6.7 (H) 09/30/2020   Urine Drug Screen: No results found for: LABOPIA, COCAINSCRNUR, LABBENZ, AMPHETMU, THCU, LABBARB  Alcohol Level No results found for: [facility]  MRI Brain  IMPRESSION: 1. Background pattern of chronic small vessel disease affecting the cerebral hemispheric white matter, progressive since 2018. 2. Restricted diffusion affecting the left caudate and the cortex of the left hemisphere, most notable in the ACA distribution. This does not seem to be artifactual. I think the differential diagnosis is postictal phenomenon versus hypoperfusion versus is the rare possibility of prion disease such as C-J disease. 3. Question small focus of restricted diffusion versus artifact within the ventral midline upper pons just below the mid brain. This could be brainstem small vessel infarction. However, I favor that it is artifactual.  MRI Brain with contrast: IMPRESSION: No abnormal enhancement.  rEEG:  This is an abnormal EEG demonstrating: 1.  Diffuse slowing of electrocerebral activity. 2.  Slowing and sharply contoured theta in the left hemisphere compared to  that of the right.  A structural lesion in the left hemisphere should be ruled out.  Impression   Jocelyn Hendrix is a 86 y.o. female with 2 weeks hx of RUE incoordination, shaking and stiff with confusion and some aphasia. [place] with MRI Brain and rEEG most concerning for L hemispheric seizures vs focal status. Admitted to the hospital for worsening nausea.  Recommendations  - I ordered Keppra load 20mg /Kg IV once - I ordered maintenance Keppra to 500mg  BID - Ativan 1mg  for any seizures lasting more than 3 mins. - Seizure precautions. - I ordered a cEEG. Will be hooked up early morning. - Daighter and are the primary decision makers and they both declined a spinal tap. _______________________________________________________________  Thank you for the opportunity to take part in the care of this patient. If you have any further questions, please contact the neurology consultation attending.  Signed,  [facility] Pager Number _ _ _   _ __   _ __ _ _  __ __   _ __   __ _

## 2020-10-10 ENCOUNTER — Inpatient Hospital Stay (HOSPITAL_COMMUNITY): Payer: Medicare Other

## 2020-10-10 LAB — BASIC METABOLIC PANEL
Anion gap: 10 (ref 5–15)
BUN: 36 mg/dL — ABNORMAL HIGH (ref 8–23)
CO2: 25 mmol/L (ref 22–32)
Calcium: 10 mg/dL (ref 8.9–10.3)
Chloride: 103 mmol/L (ref 98–111)
Creatinine, Ser: 1.29 mg/dL — ABNORMAL HIGH (ref 0.44–1.00)
GFR, Estimated: 41 mL/min — ABNORMAL LOW (ref >60)
Glucose, Bld: 132 mg/dL — ABNORMAL HIGH (ref 70–99)
Potassium: 4.4 mmol/L (ref 3.5–5.1)
Sodium: 138 mmol/L (ref 135–145)

## 2020-10-10 LAB — CBC
HCT: 38 % (ref 36.0–46.0)
Hemoglobin: 12.7 g/dL (ref 12.0–15.0)
MCH: 30.5 pg (ref 26.0–34.0)
MCHC: 33.4 g/dL (ref 30.0–36.0)
MCV: 91.1 fL (ref 80.0–100.0)
Platelets: 132 10*3/uL — ABNORMAL LOW (ref 150–400)
RBC: 4.17 MIL/uL (ref 3.87–5.11)
RDW: 13.1 % (ref 11.5–15.5)
WBC: 6.9 10*3/uL (ref 4.0–10.5)
nRBC: 0 % (ref 0.0–0.2)

## 2020-10-10 LAB — CBG MONITORING, ED
Glucose-Capillary: 123 mg/dL — ABNORMAL HIGH (ref 70–99)
Glucose-Capillary: 79 mg/dL (ref 70–99)
Glucose-Capillary: 96 mg/dL (ref 70–99)

## 2020-10-10 LAB — LACTIC ACID, PLASMA: Lactic Acid, Venous: 1.4 mmol/L (ref 0.5–1.9)

## 2020-10-10 MED ORDER — SODIUM CHLORIDE 0.9 % IV SOLN
250.0000 mg | Freq: Once | INTRAVENOUS | Status: AC
  Administered 2020-10-10: 250 mg via INTRAVENOUS
  Filled 2020-10-10: qty 2.5

## 2020-10-10 MED ORDER — SODIUM CHLORIDE 0.9 % IV SOLN
750.0000 mg | Freq: Two times a day (BID) | INTRAVENOUS | Status: DC
  Administered 2020-10-10 – 2020-10-13 (×4): 750 mg via INTRAVENOUS
  Filled 2020-10-10 (×8): qty 7.5

## 2020-10-10 MED ORDER — LEVETIRACETAM 750 MG PO TABS
750.0000 mg | ORAL_TABLET | Freq: Two times a day (BID) | ORAL | Status: DC
  Administered 2020-10-11: 750 mg via ORAL
  Filled 2020-10-10 (×7): qty 1

## 2020-10-10 NOTE — Progress Notes (Signed)
EEG complete - results pending 

## 2020-10-10 NOTE — ED Notes (Signed)
Pt repositioned

## 2020-10-10 NOTE — Progress Notes (Signed)
PROGRESS NOTE    Jocelyn Hendrix  JOA:[id number] DOB: Jan 19, 1935 DOA: 10/09/2020 PCP: Kermit Balo, DO   Brief Narrative:  Patient is 86 year old female with past medical history of type 2 diabetes mellitus, hypertension, hyperlipidemia presents for evaluation of nausea, vomiting, generalized weakness and possible seizures.  As per daughter: Patient functional and independent until about 2 weeks ago-saw neurology as an outpatient couple of days ago-work-up including MRI brain: Negative for acute findings, EEG concern for focal seizure/postictal state, differential diagnosis, hypoperfusion and CJD.  Patient started on Keppra 250 mg twice daily and with plan to titrate up and do 48-hour ambulatory EEG.  This is her third ED visit due to decline in cognition and worsening nausea.  Upon arrival to ED: Patient appears confused, blood pressure elevated at 200/121, other vital signs within normal limits, afebrile, CBC shows no leukocytosis, platelet: 132 cm, TSH: WNL, COVID-19 negative, UA shows trace leukocyte and rare bacteria P shows creatinine of 1.61, GFR of 32-CT head, MRI brain: Negative for acute findings.  Chest x-ray negative for pneumonia.  Neurology consulted and patient started on IV fluids and admitted for further evaluation and management of seizures.  Assessment & Plan:  Recurrent seizures: -CT head, MRI brain: Negative for acute findings. -Patient was given Keppra load of 20 mg/kg IV once and currently on 500 mg twice daily -EEG-findings representative of post ictal state, structural lesion (including CJD, tumor, stroke or other focal pathology) or less likely ongoing ictal activity. -Family refused lumbar puncture -Neurology consulted-appreciate help -Continue Ativan 1 mg as needed for seizures -On seizure/aspiration precautions  Acute metabolic encephalopathy: -Multifactorial in the setting of ictal/post ictal state versus dehydration -Reviewed CT head and MRI  brain. -Continue with IV  hydration.  TSH: WNL, check ammonia, B12, folate -Consult PT/OT/SLP -We will keep her n.p.o until she passes bedside swallow evaluation. -Neuro checks every 4 hours.  AKI: Likely in the setting of dehydration due to nausea and vomiting. -Continue with IV hydration. -Renal function is improving.  Continue to monitor.  Type 2 diabetes mellitus: -Well-controlled.  A1c: 6.7%-checked on 1/27 -Home meds including Metformin-hold for now.  Continue with sliding scale insulin -Monitor blood sugar closely  Hypertension: Uncontrolled. -Hold home meds lisinopril.  Continue labetalol as needed.  Monitor blood pressure closely  Thrombocytopenia: Platelet 132-chronic.  No signs of active bleeding.  Continue to monitor  Goals of care: I had long discussion with patient's daughter at the bedside regarding CODE STATUS.  She wishes for DNR and does not want heroic measures/intervention including lumbar puncture at this time.  She wants to keep her mom comfortable as much as possible.  DVT prophylaxis: Lovenox Code Status: DNR-confirmed with patient's daughter at the bedside Family Communication: Patient's daughter present at bedside.  Plan of care discussed with patient in length and she verbalized understanding and agreed with it. Disposition Plan: To be determined  Consultants:   Neurology  Procedures:   CT head  MRI brain  EEG  Antimicrobials:   None  Status is: Inpatient   Dispo: The patient is from: Home              Anticipated d/c is to: Home              Anticipated d/c date is: 2 days              Patient currently is not medically stable to d/c.   Difficult to place patient No   Subjective: Patient seen and examined in  ED.  Alert and awake but appears confused, not talking and not following commands.  Daughter at the bedside.  Objective: Vitals:   10/10/20 0145 10/10/20 0315 10/10/20 0400 10/10/20 0445  BP: (!) 126/103 (!) 186/85 (!) 194/66  (!) 172/121  Pulse: 90 88 86 94  Resp: (!) 21 16 19 20   Temp:      SpO2: 100% 100% 99% 100%   No intake or output data in the 24 hours ending 10/10/20 0740 There were no vitals filed for this visit.  Examination:  General exam: Appears calm and comfortable, on room air, elderly thin and lean African-American female, hard of hearing, Respiratory system: Clear to auscultation. Respiratory effort normal. Cardiovascular system: S1 & S2 heard, RRR. No JVD, murmurs, rubs, gallops or clicks. No pedal edema. Gastrointestinal system: Abdomen is nondistended, soft and nontender. No organomegaly or masses felt. Normal bowel sounds heard. Central nervous system: Awake but confused-not following commands and not talking  Skin: No rashes, lesions or ulcers   Data Reviewed: I have personally reviewed following labs and imaging studies  CBC: Recent Labs  Lab 10/09/20 0900 10/10/20 0414  WBC 8.1 6.9  HGB 13.4 12.7  HCT 41.7 38.0  MCV 90.7 91.1  PLT 150 132*   Basic Metabolic Panel: Recent Labs  Lab 10/09/20 0900 10/09/20 2210 10/10/20 0414  NA 137  --  138  K 3.9  --  4.4  CL 100  --  103  CO2 22  --  25  GLUCOSE 148*  --  132*  BUN 39*  --  36*  CREATININE 1.61*  --  1.29*  CALCIUM 10.3  --  10.0  MG  --  1.8  --    GFR: Estimated Creatinine Clearance: 29.7 mL/min (A) (by C-G formula based on SCr of 1.29 mg/dL (H)). Liver Function Tests: Recent Labs  Lab 10/09/20 0900  AST 40  ALT 16  ALKPHOS 45  BILITOT 0.8  PROT 8.0  ALBUMIN 4.4   Recent Labs  Lab 10/09/20 0900  LIPASE 28   No results for input(s): AMMONIA in the last 168 hours. Coagulation Profile: No results for input(s): INR, PROTIME in the last 168 hours. Cardiac Enzymes: No results for input(s): CKTOTAL, CKMB, CKMBINDEX, TROPONINI in the last 168 hours. BNP (last 3 results) No results for input(s): PROBNP in the last 8760 hours. HbA1C: No results for input(s): HGBA1C in the last 72 hours. CBG: Recent  Labs  Lab 10/09/20 0854 10/09/20 2209 10/10/20 0540  GLUCAP 145* 118* 123*   Lipid Profile: No results for input(s): CHOL, HDL, LDLCALC, TRIG, CHOLHDL, LDLDIRECT in the last 72 hours. Thyroid Function Tests: Recent Labs    10/09/20 2210  TSH 1.715   Anemia Panel: No results for input(s): VITAMINB12, FOLATE, FERRITIN, TIBC, IRON, RETICCTPCT in the last 72 hours. Sepsis Labs: No results for input(s): PROCALCITON, LATICACIDVEN in the last 168 hours.  Recent Results (from the past 240 hour(s))  SARS Coronavirus 2 by RT PCR (hospital order, performed in [facility] hospital lab) Nasopharyngeal Nasopharyngeal Swab     Status: None   Collection Time: 10/09/20  5:30 PM   Specimen: Nasopharyngeal Swab  Result Value Ref Range Status   SARS Coronavirus 2 NEGATIVE NEGATIVE Final    Comment: (NOTE) SARS-CoV-2 target nucleic acids are NOT DETECTED.  The SARS-CoV-2 RNA is generally detectable in upper and lower respiratory specimens during the acute phase of infection. The lowest concentration of SARS-CoV-2 viral copies this assay can detect is 250  copies / mL. A negative result does not preclude SARS-CoV-2 infection and should not be used as the sole basis for treatment or other patient management decisions.  A negative result may occur with improper specimen collection / handling, submission of specimen other than nasopharyngeal swab, presence of viral mutation(s) within the areas targeted by this assay, and inadequate number of viral copies (<250 copies / mL). A negative result must be combined with clinical observations, patient history, and epidemiological information.  Fact Sheet for Patients:   BoilerBrush.com.cy  Fact Sheet for Healthcare Providers: https://pope.com/  This test is not yet approved or  cleared by the [place] FDA and has been authorized for detection and/or diagnosis of SARS-CoV-2 by FDA under an Emergency  Use Authorization (EUA).  This EUA will remain in effect (meaning this test can be used) for the duration of the COVID-19 declaration under Section 564(b)(1) of the Act, 21 U.S.C. section 360bbb-3(b)(1), unless the authorization is terminated or revoked sooner.  Performed at [facility], [address] [place], [place] [address]       Radiology Studies: DG Chest Port 1 View  Result Date: 10/09/2020 CLINICAL DATA:  Cough, weakness, vomiting EXAM: PORTABLE CHEST 1 VIEW COMPARISON:  11/07/2011 FINDINGS: Heart and mediastinal contours are within normal limits. No focal opacities or effusions. No acute bony abnormality. IMPRESSION: No active disease. Electronically Signed   By: Charlett Nose M.D.   On: 10/09/2020 19:28    Scheduled Meds: . enoxaparin (LOVENOX) injection  30 mg Subcutaneous Q24H  . insulin aspart  0-9 Units Subcutaneous Q4H  . levETIRAcetam  500 mg Oral BID   Continuous Infusions: . lactated ringers 100 mL/hr at 10/09/20 2241  . levETIRAcetam Stopped (10/09/20 2237)     LOS: 1 day   Time spent: 40 minutes   Rinka Estill Cotta, MD [facility]  If 7PM-7AM, please contact night-coverage [email or web address] 10/10/2020, 7:40 AM

## 2020-10-10 NOTE — Progress Notes (Signed)
(  Delayed encounter) LTM EEG hooked up and running - no initial skin breakdown - push button tested - neuro notified. Same leads used, pt in ED, NOT monitored by [facility].

## 2020-10-10 NOTE — ED Notes (Signed)
CBG collected. Result "96." RN, Eulis Foster, notified.

## 2020-10-10 NOTE — ED Notes (Signed)
CBG collected. Result "79." RN, [employer], notified.

## 2020-10-10 NOTE — Procedures (Addendum)
History: 86 yo F with recently suspected seizures and cognitive decline.   Sedation: None  Technique: This is a 21 channel routine scalp EEG performed at the bedside with bipolar and monopolar montages arranged in accordance to the international 10/20 system of electrode placement. One channel was dedicated to EKG recording.    Background: There is a posterior dominant rhythm which is seen bilaterally of 7 to 8 Hz.  In addition, there is a relatively normal appearing beta range activity on the right which is attenuated on the left.  There is diffuse irregular delta activity intruding into the background as well as occasional runs of periodic 1 Hz activity with anteriortemporal/frontotemporal location which at times is sharply contoured and at other times with a smoothly contoured theta wave appearance.  There is no definite evolution to this pattern, though it does wax and wane in amplitude at times not being visible at all.  Photic stimulation: Physiologic driving is not performed  EEG Abnormalities: 1) periodic lateralized discharges with variance in morphology, but no definite evolution. 2) focal left hemispheric slow activity 3) generalized irregular slow activity  Clinical Interpretation: This EEG recorded a pattern that is on the ictal-interictal continuum, though without features highly concerning for status epilepticus.  This could be representative of postictal state, structural lesion (including CJD, tumor, stroke or other focal pathology), or less likely ongoing ictal activity.  Ritta Slot, MD [facility] [phone]  If 7pm- 7am, please page neurology on call as listed in AMION.

## 2020-10-10 NOTE — Progress Notes (Signed)
Neurology Progress Note  S: patient is confused and it is difficult to illicit subjective from her. Still shaking right arm, mostly hand per daughter.   O: Current vital signs: BP (!) 152/67   Pulse 77   Temp 97.8 F (36.6 C) (Oral)   Resp 17   SpO2 100%  Vital signs in last 24 hours: Temp:  [97.8 F (36.6 C)] 97.8 F (36.6 C) (02/06 0745) Pulse Rate:  [74-100] 77 (02/06 1345) Resp:  [14-27] 17 (02/06 1345) BP: (126-200)/(66-121) 152/67 (02/06 1345) SpO2:  [92 %-100 %] 100 % (02/06 1345)  GENERAL: Awake, alert in NAD HEENT: Normocephalic and atraumatic LUNGS: Normal respiratory effort.  CV: RRR on tele.  ABDOMEN: Soft, nontender Ext: warm  NEURO:  Mental Status: AA&O to self, place.  Speech/Language: Patient just speaks in 1-2 word phrases. No dysarthria. Comprehension is slowed and she perseverates on parts of exam.  Cranial Nerves:  II: PERRL.  III, IV, VI: EOMI. Eyelids elevate symmetrically.  VII: Smile is symmetrical.  VIII: hearing intact to voice. IX, X: Palate elevates symmetrically. Phonation is normal.  CB:JSEGBTDV shrug 5/5. XII: tongue is midline without fasciculations. Motor: 5/5 strength to all muscle groups tested.  Tone: normal. Bulk is decreased.  Sensation- Intact to light touch bilaterally  Medications  Current Facility-Administered Medications:  .  acetaminophen (TYLENOL) tablet 650 mg, 650 mg, Oral, Q6H PRN **OR** acetaminophen (TYLENOL) suppository 650 mg, 650 mg, Rectal, Q6H PRN, Hillary Bow, DO .  enoxaparin (LOVENOX) injection 30 mg, 30 mg, Subcutaneous, Q24H, Gardner, Jared M, DO, 30 mg at 10/10/20 7616 .  labetalol (NORMODYNE) injection 5-10 mg, 5-10 mg, Intravenous, Q2H PRN, Hillary Bow, DO, 5 mg at 10/10/20 0703 .  lactated ringers infusion, , Intravenous, Continuous, Hillary Bow, DO, Last Rate: 100 mL/hr at 10/09/20 2241, New Bag at 10/09/20 2241 .  levETIRAcetam (KEPPRA) IVPB 500 mg/100 mL premix, 500 mg, Intravenous,  BID, Stopped at 10/10/20 1136 **OR** levETIRAcetam (KEPPRA) tablet 500 mg, 500 mg, Oral, BID, Erick Blinks, MD .  LORazepam (ATIVAN) injection 1 mg, 1 mg, Intravenous, Q15 min PRN, Erick Blinks, MD .  ondansetron (ZOFRAN) tablet 4 mg, 4 mg, Oral, Q6H PRN **OR** ondansetron (ZOFRAN) injection 4 mg, 4 mg, Intravenous, Q6H PRN, Hillary Bow, DO, 4 mg at 10/09/20 2313  Current Outpatient Medications:  .  aspirin 81 MG tablet, Take 81 mg by mouth daily., Disp: , Rfl:  .  Cranberry 475 MG CAPS, Take 1 capsule (475 mg total) by mouth 2 (two) times daily., Disp: 60 capsule, Rfl: 1 .  levETIRAcetam (KEPPRA) 250 MG tablet, Take 1 tablet (250 mg total) by mouth 2 (two) times daily., Disp: 180 tablet, Rfl: 1 .  lisinopril (ZESTRIL) 40 MG tablet, TAKE 1 TABLET BY MOUTH ONCE DAILY FOR BLOOD PRESSURE, Disp: 90 tablet, Rfl: 1 .  metFORMIN (GLUCOPHAGE) 500 MG tablet, Take 1 tablet (500 mg total) by mouth 3 (three) times daily., Disp: 270 tablet, Rfl: 1 .  ACCU-CHEK GUIDE test strip, USE 1-2 TIMES DAILY AS NEEDED, Disp: 200 strip, Rfl: 3 .  Lancets (ACCU-CHEK SOFT TOUCH) lancets, 1 each by Other route as needed for other. Use as instructed  DX E11.9, Disp: 200 each, Rfl: 3  CMP     Component Value Date/Time   NA 138 10/10/2020 0414   NA 140 01/06/2016 0845   K 4.4 10/10/2020 0414   CL 103 10/10/2020 0414   CO2 25 10/10/2020 0414   GLUCOSE 132 (H) 10/10/2020  0414   BUN 36 (H) 10/10/2020 0414   BUN 16 01/06/2016 0845   CREATININE 1.29 (H) 10/10/2020 0414   CREATININE 1.07 (H) 09/29/2020 1027   CALCIUM 10.0 10/10/2020 0414   PROT 8.0 10/09/2020 0900   PROT 7.6 01/06/2016 0845   ALBUMIN 4.4 10/09/2020 0900   ALBUMIN 4.1 01/06/2016 0845   AST 40 10/09/2020 0900   ALT 16 10/09/2020 0900   ALKPHOS 45 10/09/2020 0900   BILITOT 0.8 10/09/2020 0900   BILITOT 0.3 01/06/2016 0845   GFRNONAA 41 (L) 10/10/2020 0414   GFRNONAA 48 (L) 09/29/2020 1027   GFRAA 56 (L) 09/29/2020 1027     Imaging Reviewed out patient MRIs with and without ordered by Dr. Arbutus Leas at [facility]. See consult note as well.   EEG: This EEG recorded a pattern that is on the ictal-interictal continuum, though without features highly concerning for status epilepticus.  This could be representative of postictal state, structural lesion (including CJD, tumor, stroke or other focal pathology), or less likely ongoing ictal activity.   Assessment/Plan: The pattern of restricted diffusion on MRI is not consistent with JCD. Patient refused LP so our workup is limited. EEG without features highly concerning for status epilepticus.  1. Left hemispheric seizures vs focal status-Keppra change: 250mg  Keppra IV now and increase dose to 750mg  bid (either IV or po)  Pt seen by , MSN, APN-BC/Nurse Practitioner/Neuro and by MD. Plan was discussed with and approved by MD.   Pager: 

## 2020-10-11 ENCOUNTER — Encounter (HOSPITAL_COMMUNITY): Payer: Self-pay | Admitting: Internal Medicine

## 2020-10-11 DIAGNOSIS — G9340 Encephalopathy, unspecified: Secondary | ICD-10-CM

## 2020-10-11 DIAGNOSIS — Z7189 Other specified counseling: Secondary | ICD-10-CM

## 2020-10-11 DIAGNOSIS — Z515 Encounter for palliative care: Secondary | ICD-10-CM

## 2020-10-11 LAB — COMPREHENSIVE METABOLIC PANEL
ALT: 15 U/L (ref 0–44)
AST: 42 U/L — ABNORMAL HIGH (ref 15–41)
Albumin: 3.6 g/dL (ref 3.5–5.0)
Alkaline Phosphatase: 43 U/L (ref 38–126)
Anion gap: 11 (ref 5–15)
BUN: 25 mg/dL — ABNORMAL HIGH (ref 8–23)
CO2: 26 mmol/L (ref 22–32)
Calcium: 10 mg/dL (ref 8.9–10.3)
Chloride: 104 mmol/L (ref 98–111)
Creatinine, Ser: 1.16 mg/dL — ABNORMAL HIGH (ref 0.44–1.00)
GFR, Estimated: 47 mL/min — ABNORMAL LOW (ref >60)
Glucose, Bld: 113 mg/dL — ABNORMAL HIGH (ref 70–99)
Potassium: 4.4 mmol/L (ref 3.5–5.1)
Sodium: 141 mmol/L (ref 135–145)
Total Bilirubin: 0.8 mg/dL (ref 0.3–1.2)
Total Protein: 7 g/dL (ref 6.5–8.1)

## 2020-10-11 LAB — CBG MONITORING, ED: Glucose-Capillary: 104 mg/dL — ABNORMAL HIGH (ref 70–99)

## 2020-10-11 LAB — VITAMIN B12: Vitamin B-12: 1013 pg/mL — ABNORMAL HIGH (ref 180–914)

## 2020-10-11 LAB — FOLATE: Folate: 13.1 ng/mL (ref >5.9)

## 2020-10-11 LAB — AMMONIA: Ammonia: 11 umol/L (ref 9–35)

## 2020-10-11 MED ORDER — ATROPINE SULFATE 1 % OP SOLN
2.0000 [drp] | Freq: Four times a day (QID) | OPHTHALMIC | Status: DC | PRN
  Filled 2020-10-11: qty 2

## 2020-10-11 MED ORDER — MORPHINE SULFATE (CONCENTRATE) 10 MG/0.5ML PO SOLN: 5.0000 mg | ORAL | Status: DC | PRN

## 2020-10-11 MED ORDER — LORAZEPAM 2 MG/ML PO CONC
1.0000 mg | ORAL | Status: DC | PRN
  Administered 2020-10-11 – 2020-10-12 (×2): 1 mg via ORAL
  Filled 2020-10-11 (×2): qty 1

## 2020-10-11 NOTE — Procedures (Addendum)
Patient Name: Jocelyn Hendrix  MRN: [id number]  Epilepsy Attending: Charlsie Quest  Referring Physician/Provider: Dr. Erick Blinks Duration: 10/10/2020 0945 to 10/11/2020 0840  Patient history: 86 year old female with suspected seizures and cognitive decline.  EEG to evaluate for seizures.  Level of alertness: Awake, asleep  AEDs during EEG study: Keppra  Technical aspects: This EEG study was done with scalp electrodes positioned according to the 10-20 International system of electrode placement. Electrical activity was acquired at a sampling rate of 500Hz  and reviewed with a high frequency filter of 70Hz  and a low frequency filter of 1Hz . EEG data were recorded continuously and digitally stored.   Description: The posterior dominant rhythm consists of 8-9 Hz activity of moderate voltage (25-35 uV) seen predominantly in posterior head regions, symmetric and reactive to eye opening and eye closing. Sleep was characterized by vertex waves, sleep spindles (12 to 14 Hz), maximal frontocentral region. EEG showed continuous 3 to 5 Hz theta-delta slowing in left hemisphere as well as intermittent generalized 3 to 5 Hz theta-delta slowing. Periodic discharges with triphasic morphology at 1 to 1.5 Hz were also noted in left hemisphere.  Hyperventilation and photic stimulation were not performed.     Patient event button was pressed on 10/10/2020 at 1025, 1119, 1120, 1130, 1139 for unclear reasons.  Concomitant EEG continued to show periodic discharges with triphasic morphology at 1 to 1.5 Hz in left hemisphere admixed with 3 to 5 Hz theta-delta slowing without definite evolution.  ABNORMALITY -Periodic discharges with triphasic morphology, left hemisphere -Continuous slow, left hemisphere - Intermittent slow, generalized  IMPRESSION: This study showed periodic discharges with triphasic morphology in left hemisphere at 1 to 1.5 Hz which is on the ictal-interictal continuum.  Additionally there is  evidence of cortical dysfunction in left hemisphere as well as mild diffuse encephalopathy, nonspecific etiology.  No definite seizures were seen during the study.  Haitham Dolinsky 

## 2020-10-11 NOTE — Progress Notes (Signed)
Nutrition Brief Note  Chart reviewed. Pt now transitioning to hospice care. Per pt/family request, pt will have comfort feeds. No further nutrition interventions warranted at this time.  Please re-consult as needed.   Eugene Gavia, MS, RD, LDN RD pager number and weekend/on-call pager number located in [place].

## 2020-10-11 NOTE — Evaluation (Signed)
Clinical/Bedside Swallow Evaluation Patient Details  Name: Saidy Ormand MRN: [id number] Date of Birth: 24-Dec-1934  Today's Date: 10/11/2020 Time: SLP Start Time (ACUTE ONLY): 0930 SLP Stop Time (ACUTE ONLY): 1000 SLP Time Calculation (min) (ACUTE ONLY): 30 min  Past Medical History:  Past Medical History:  Diagnosis Date  . Diabetes mellitus   . Guillain Barr syndrome (HCC)   . Hyperlipidemia   . Hypertension   . Myalgia and myositis, unspecified   . 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 Vitamin D deficiency    Past Surgical History:  Past Surgical History:  Procedure Laterality Date  . 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   Assessment / Plan / Recommendation Clinical Impression  Pt evaluated prior to cancellation of orders by NP. Pt demosntrates impaired ability to self feed given cognitive and motor impairment. Pt occasionally perseverative with oral function; she continued to swish after oral care but showed awareness of wanting to drink, but not be able to. With cueing pt was coaxed into oral transit, though this appeared effortful with pt seeming to struggle at times to transit bolus posteriorally or finish masticating. Pt was more successful with assissted self feeding. Despite impairment, there were no signs of aspiration. Pt is recommended to attempt soft solids, mech soft (though RN ordered ground foods) and thin liquids. Was planning to follow up for assistance, but orders have been cancelled likely due to plan of care. Please reorder if needed. SLP Visit Diagnosis: Dysphagia, oropharyngeal phase (R13.12)    Aspiration Risk  Mild aspiration risk    Diet Recommendation Dysphagia 3 (Mech soft);Thin liquid    Liquid Administration via: Cup;Straw Medication Administration: Crushed with puree Supervision: Staff to assist with self feeding Compensations: Slow rate;Small sips/bites Postural Changes: Seated upright at 90 degrees    Other  Recommendations Oral Care Recommendations: Oral care BID Other Recommendations: Have oral suction available   Follow up Recommendations        Frequency and Duration            Prognosis        Swallow Study   General HPI: Patient is 86 year old female with past medical history of type 2 diabetes mellitus, hypertension, hyperlipidemia presents for evaluation of nausea, vomiting, generalized weakness and possible seizures. Type of Study: Bedside Swallow Evaluation Previous Swallow Assessment: none Diet Prior to this Study: NPO Temperature Spikes Noted: No Respiratory Status: Room air History of Recent Intubation: No Behavior/Cognition: Alert;Cooperative;Pleasant mood;Requires cueing Oral Cavity Assessment: Within Functional Limits Oral Care Completed by SLP: Yes Oral Cavity - Dentition: Adequate natural dentition Self-Feeding Abilities: Needs assist Patient Positioning: Upright in bed Baseline Vocal Quality: Normal Volitional Cough: Strong Volitional Swallow: Able to elicit    Oral/Motor/Sensory Function Overall Oral Motor/Sensory Function: Within functional limits   Ice Chips     Thin Liquid Thin Liquid: Impaired Presentation: Straw;Cup Oral Phase Functional Implications: Prolonged oral transit;Oral holding    Nectar Thick Nectar Thick Liquid: Not tested   Honey Thick Honey Thick Liquid: Not tested   Puree Puree: Impaired Presentation: Spoon;Self Fed Oral Phase Functional Implications: Prolonged oral transit;Oral holding   Solid     Solid: Impaired Presentation: Self Fed Oral Phase Functional Implications: Prolonged oral transit (prolonged mastication)      Stacye Noori, Riley Nearing 10/11/2020,11:13  AM     

## 2020-10-11 NOTE — Evaluation (Signed)
Speech Language Pathology Evaluation Patient Details Name: Jocelyn Hendrix MRN: [id number] DOB: Jan 15, 1935 Today's Date: 10/11/2020 Time: 0930-1000 SLP Time Calculation (min) (ACUTE ONLY): 30 min  Problem List:  Patient Active Problem List   Diagnosis Date Noted  . AKI (acute kidney injury) (HCC) 10/09/2020  . Seizures (HCC) 10/09/2020  . Acute encephalopathy 10/09/2020  . Diverticulosis 12/12/2018  . History of Guillain-Barre syndrome 06/01/2016  . Diverticulitis 11/06/2013  . Type 2 diabetes mellitus with other specified complication (HCC) 12/05/2012  . Hyperlipidemia associated with type 2 diabetes mellitus (HCC) 12/05/2012  . Essential hypertension, benign 12/05/2012   Past Medical History:  Past Medical History:  Diagnosis Date  . Diabetes mellitus   . Guillain Barr syndrome (HCC)   . Hyperlipidemia   . Hypertension   . Myalgia and myositis, unspecified   . 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 Vitamin D deficiency    Past Surgical History:  Past Surgical History:  Procedure Laterality Date  . 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   Assessment / Plan / Recommendation Clinical Impression  Pt demonstrates language impairment with expressive greater than receptive impairment. Pt able to comprehend simple one step commands and phrases, but has difficulty following two step commands and is often perseverative making comprehension difficult to evalaute. Pt is able to repeat single words and name simple items, but increasing complexity of grammar or articualtion leads to perseveration and neologisms. Pt laughs when she hears her errors, indicating some awareness. She would would be stimulable for therapeutic  interventions. Her daughter reports she was going to recieve [facility] SLP, which would still be appropriate. GIven that orders were d/cd here, recommend ongoing interventions as appropriate per pts plan of care.    SLP Assessment  SLP Recommendation/Assessment: All further Speech Lanaguage Pathology  needs can be addressed in the next venue of care SLP Visit Diagnosis: Aphasia (R47.01)    Follow Up Recommendations  Home health SLP    Frequency and Duration           SLP Evaluation Cognition  Overall Cognitive Status: Impaired/Different from baseline Arousal/Alertness: Awake/alert Orientation Level: Oriented to person;Other (comment) (unable to verbalize further orientation) Attention: Focused;Sustained;Selective Focused Attention: Appears intact Sustained Attention: Appears intact Selective Attention: Impaired Selective Attention Impairment: Verbal basic;Functional basic Memory:  (unable to assess) Awareness: Impaired Awareness Impairment: Intellectual impairment Problem Solving: Impaired Problem Solving Impairment: Functional basic;Verbal basic       Comprehension  Auditory Comprehension Overall Auditory Comprehension: Impaired Yes/No Questions: Not tested Commands: Impaired One Step Basic Commands: 75-100% accurate Two Step Basic Commands: 0-24% accurate Conversation: Simple    Expression Verbal Expression Overall Verbal Expression: Impaired Initiation: Impaired Automatic Speech: Name;Social Response Level of Generative/Spontaneous Verbalization: Phrase Repetition: Impaired Level of Impairment: Phrase level;Sentence level Naming: Impairment Responsive: Not tested Confrontation: Impaired Convergent: Not tested Divergent: Not tested Verbal Errors: Perseveration;Semantic paraphasias;Phonemic paraphasias;Aware of errors;Not aware of errors Interfering Components: Attention Written Expression Dominant Hand: Right   Oral / Motor  Oral Motor/Sensory Function Overall Oral  Motor/Sensory Function: Within functional limits Motor Speech Overall Motor Speech: Appears within functional limits for tasks assessed   GO                   Harlon Ditty, MA CCC-SLP  Acute Rehabilitation Services  Pager [phone] Office [phone]  Claudine Mouton 10/11/2020, 11:26 AM

## 2020-10-11 NOTE — Progress Notes (Signed)
LTM EEG discontinued - no skin breakdown at [employer] per [employer]

## 2020-10-11 NOTE — Consult Note (Signed)
Consultation Note Date: 10/11/2020   Patient Name: Jocelyn Hendrix  DOB: 1934/12/16  MRN: [id number]  Age / Sex: 86 y.o., female  PCP: Jocelyn Curry, DO Referring Physician: Mckinley Jewel, MD  Reason for Consultation: Establishing goals of care  HPI/Patient Profile: 86 y.o. female  with past medical history of DM type 2, hypertension, hyperlipidemia admitted on 10/09/2020 with nausea, vomiting, weakness, ? Seizures. Daughter reports patient functional and independent until approximately 2 weeks ago. Evaluated by outpatient neurology--MRI brain negative for acute findings but EEG concern for focal seizure/postictal state. Patient was started on Keppra. This is patient's third ED visit due to decline. This admission, CT head/MRI brain negative for acute findings. EEG revealed post ictal state, structural lesion (including CJD, tumor, stroke or other focal pathology). Family declined LP or heroic workup/measures. Patient with ongoing decline in functional, cognitive, and nutritional status.   Clinical Assessment and Goals of Care:  I have reviewed medical records, discussed with Jocelyn Hendrix, and met with daughter Jocelyn Hendrix) in conference room to discuss goals of care. Jocelyn Hendrix is awake and surprisingly oriented to self and place, otherwise pleasant confusion. Oral intake is very poor. She is unable to participate in Filer discussion.  I introduced Palliative Medicine as specialized medical care for people living with serious illness. It focuses on providing relief from the symptoms and stress of a serious illness. The goal is to improve quality of life for both the patient and the family.  We discussed a brief life review of the patient. Two weeks before admission, Jocelyn Hendrix reports her mother was functional and independent. She was still going out to [employer] and shopping with friends. Patient has 7 children, 2 deceased.    Jocelyn Hendrix reports events leading up to admission including significant decline in the last two weeks and outpatient neurology evaluation. Discussed course of hospitalization including diagnoses, interventions, plan of care.   I attempted to elicit values and goals of care important to the patient and daughter. Advanced directives, concepts specific to code status, artifical feeding and hydration, and rehospitalization were considered and discussed. Jocelyn Hendrix reports her mother does not have a documented living will/POA. Jocelyn Hendrix has been in contact with other family members about Jocelyn Hendrix' condition and they agree she would not wish to live in this current state. Quality of life is very important to Jocelyn Hendrix. Jocelyn Hendrix shares that her mother has been begging to "let me go" and Jocelyn Hendrix feels like she is suffering. Daughter from [place] will arrive this evening. The family does not wish to pursue heroic measures such as LP. They wish to focus on comfort and initiation of hospice services. Jocelyn Hendrix confirms DNR code status.   Discussed hospice options and philosophy. Jocelyn Hendrix would prefer to take her mother home with hospice support, understanding philosophy and goals aimed at comfort. Jocelyn Hendrix has support from her sister's and other family members to care for their mother at home. Will need DME. Reassured daughter of TOC involvement.   Discussed symptom management medications.    Questions and concerns  were addressed. Hard Choices booklet and PMT contact information given.    SUMMARY OF RECOMMENDATIONS    Daughter confirms DNR code status. No heroics.  Family wishes to take patient home with hospice services. TOC notified. Will need DME.  Symptom management--see below  Comfort feeds per patient/family request. Will need assist and encouragement with meals. Aspiration precautions. Dysphagia diet initiated.   Unrestricted visitor access.   Code Status/Advance Care Planning:  DNR  Symptom Management:   Roxanol 5-62m  SL q3h prn pain/dyspnea/air hunger/tachypnea  Ativan 189mPO q2h prn anxiety/seizures  Atropine SL QID prn secretions  Palliative Prophylaxis:   Aspiration, Bowel Regimen, Delirium Protocol, Frequent Pain Assessment, Oral Care and Turn Reposition  Additional Recommendations (Limitations, Scope, Preferences):  Full Comfort Care  Psycho-social/Spiritual:   Desire for further Chaplaincy support: yes  Additional Recommendations: Caregiving  Support/Resources, Compassionate Wean Education and Education on Hospice  Prognosis:   Poor long-term prognosis: likely weeks with failure to thrive. Severe decline in functional/cognitive/nutritional status. Post-ictal.  Discharge Planning: Home with Hospice      Primary Diagnoses: Present on Admission: . Type 2 diabetes mellitus with other specified complication (HCMarrowbone. AKI (acute kidney injury) ([facility]onson. Essential hypertension, benign . Acute encephalopathy   I have reviewed the medical record, interviewed the patient and family, and examined the patient. The following aspects are pertinent.  Past Medical History:  Diagnosis Date  . Diabetes mellitus   . Guillain Barr syndrome (HCSilex  . Hyperlipidemia   . Hypertension   . Myalgia and myositis, unspecified   . 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 Vitamin D deficiency    Social History   Socioeconomic History  . Marital status: Widowed    Spouse name: Not on file  . Number of children: Not on file  . Years of education: Not on file  . Highest education level: Not on file  Occupational History  . Not on file  Tobacco Use  . Smoking status: Never Smoker  . Smokeless tobacco: Never Used  Vaping Use  . Vaping Use: Never used  Substance and Sexual Activity  . Alcohol use: No    Alcohol/week: 0.0 standard drinks  . Drug use: No  . Sexual activity: Not Currently    Birth control/protection: Post-menopausal  Other Topics Concern  . Not on file  Social  History Narrative  . Not on file   Social Determinants of Health   Financial Resource Strain: Not on file  Food Insecurity: Not on file  Transportation Needs: Not on file  Physical Activity: Not on file  Stress: Not on file  Social Connections: Not on file   Family History  Problem Relation Age of Onset  . Heart failure Mother   . Diabetes Mother   . Alzheimer's disease Sister   . Drug abuse Child    Scheduled Meds: . levETIRAcetam  750 mg Oral BID   Continuous Infusions: . lactated ringers 50 mL/hr at 10/11/20 1148  . levETIRAcetam Stopped (10/11/20 0152)   PRN Meds:.acetaminophen **OR** acetaminophen, atropine, labetalol, LORazepam, LORazepam, morphine CONCENTRATE, ondansetron **OR** ondansetron (ZOFRAN) IV Medications Prior to Admission:  Prior to Admission medications   Medication Sig Start Date End Date Taking? Authorizing Provider  aspirin 81 MG tablet Take 81 mg by mouth daily.   Yes [provider]  Cranberry 475 MG CAPS Take 1 capsule (475 mg total) by mouth 2 (two) times daily. 09/13/20 10/13/20 Yes Ngetich, Dinah C, NP  levETIRAcetam (KEPPRA) 250 MG tablet  Take 1 tablet (250 mg total) by mouth 2 (two) times daily. 10/07/20  Yes Tat, Eustace Quail, DO  lisinopril (ZESTRIL) 40 MG tablet TAKE 1 TABLET BY MOUTH ONCE DAILY FOR BLOOD PRESSURE 09/15/20  Yes Reed, Tiffany L, DO  metFORMIN (GLUCOPHAGE) 500 MG tablet Take 1 tablet (500 mg total) by mouth 3 (three) times daily. 09/15/20  Yes Reed, Tiffany L, DO  ACCU-CHEK GUIDE test strip USE 1-2 TIMES DAILY AS NEEDED 02/04/20   Reed, Tiffany L, DO  Lancets (ACCU-CHEK SOFT TOUCH) lancets 1 each by Other route as needed for other. Use as instructed  DX E11.9 03/18/19   Ngetich, Dinah C, NP   No Known Allergies Review of Systems  Unable to perform ROS  Physical Exam Vitals and nursing note reviewed.  Constitutional:      General: She is awake.     Appearance: She is cachectic. She is ill-appearing.  HENT:     Head:  Normocephalic and atraumatic.  Pulmonary:     Effort: No tachypnea, accessory muscle usage or respiratory distress.  Skin:    General: Skin is warm and dry.  Neurological:     Mental Status: She is alert.     Comments: Alert, oriented to self/place. Pleasant confusion  Psychiatric:        Attention and Perception: She is inattentive.        Speech: Speech is delayed.        Cognition and Memory: Cognition is impaired.    Vital Signs: BP (!) 174/99 (BP Location: Left Arm)   Pulse 96   Temp 97.8 F (36.6 C) (Oral)   Resp 18   SpO2 100%  Pain Scale: 0-10   Pain Score: 0-No pain   SpO2: SpO2: 100 % O2 Device:SpO2: 100 % O2 Flow Rate: .O2 Flow Rate (L/min): 0 L/min  IO: Intake/output summary: No intake or output data in the 24 hours ending 10/11/20 1322  LBM: Last BM Date:  (PTA) Baseline Weight:   Most recent weight:       Palliative Assessment/Data: PPS 30%   Flowsheet Rows   Flowsheet Row Most Recent Value  Intake Tab   Referral Department Hospitalist  Unit at Time of Referral Cardiac/Telemetry Unit  Palliative Care Primary Diagnosis Neurology  Palliative Care Type New Palliative care  Reason for referral Clarify Goals of Care  Date first seen by Palliative Care 10/11/20  Clinical Assessment   Palliative Performance Scale Score 30%  Psychosocial & Spiritual Assessment   Palliative Care Outcomes   Patient/Family meeting held? Yes  Who was at the meeting? daughter  Palliative Care Outcomes Clarified goals of care, Counseled regarding hospice, Improved pain interventions, Improved non-pain symptom therapy, Provided end of life care assistance, Provided psychosocial or spiritual support, Changed to focus on comfort, Transitioned to hospice, ACP counseling assistance       Time Total: 40 Greater than 50%  of this time was spent counseling and coordinating care related to the above assessment and plan.  Signed by:  Ihor Dow, DNP, FNP-C Palliative Medicine  Team  Phone: [phone] Fax: [phone]  Please contact Palliative Medicine Team phone at [phone] for questions and concerns.  For individual provider: See Shea Evans

## 2020-10-11 NOTE — ED Notes (Addendum)
MD  Pahwani at bedside. Report given to inpatient RN(Melissa)

## 2020-10-11 NOTE — Progress Notes (Signed)
PROGRESS NOTE    Jocelyn Hendrix  HYQ:[id number] DOB: [birth year] DOA: 10/09/2020 PCP: Kermit Balo, DO   Brief Narrative:  Patient is 86 year old female with past medical history of type 2 diabetes mellitus, hypertension, hyperlipidemia presents for evaluation of nausea, vomiting, generalized weakness and possible seizures.  As per daughter: Patient functional and independent until about 2 weeks ago-saw neurology as an outpatient couple of days ago-work-up including MRI brain: Negative for acute findings, EEG concern for focal seizure/postictal state, differential diagnosis, hypoperfusion and CJD.  Patient started on Keppra 250 mg twice daily and with plan to titrate up and do 48-hour ambulatory EEG.  This is her third ED visit due to decline in cognition and worsening nausea.  Upon arrival to ED: Patient appears confused, blood pressure elevated at 200/121, other vital signs within normal limits, afebrile, CBC shows no leukocytosis, platelet: 132 cm, TSH: WNL, COVID-19 negative, UA shows trace leukocyte and rare bacteria P shows creatinine of 1.61, GFR of 32-CT head, MRI brain: Negative for acute findings.  Chest x-ray negative for pneumonia.  Neurology consulted and patient started on IV fluids and admitted for further evaluation and management of seizures.  Assessment & Plan:  Recurrent seizures: -CT head, MRI brain: Negative for acute findings. -EEG-findings representative of post ictal state, structural lesion (including CJD, tumor, stroke or other focal pathology) or less likely ongoing ictal activity. -Overnight EEG-showed features that are on ictal-interictal continuum but not concerning for seizure/status epilepticus. -Family refused lumbar puncture & heroic intervention -Neurology on board -appreciate help -Patient was given Keppra load of 20 mg/kg IV once in ED- -Keppra dose changed from 500 mg twice daily to 750 mg twice daily -Continue Ativan 1 mg as needed for seizures -On  seizure/aspiration precautions  Acute metabolic encephalopathy: -Multifactorial in the setting of ictal/post ictal state versus dehydration -Reviewed CT head and MRI brain. -Continue with IV  hydration.  TSH: WNL -Consult PT/OT/SLP -We will keep her n.p.o until she passes bedside swallow evaluation. -Neuro checks every 4 hours.  AKI: Likely in the setting of dehydration due to nausea and vomiting & poor PO intake. -Renal function is improving.  Continue to monitor.  Type 2 diabetes mellitus: -Well-controlled.  A1c: 6.7%-checked on 1/27 -Home meds including Metformin-hold for now.  Continue with sliding scale insulin -Monitor blood sugar closely  Hypertension: Labile -Hold home meds lisinopril as patient is n.p.o.  Continue labetalol as needed.  Monitor blood pressure closely  Thrombocytopenia: Platelet 132-chronic.  No signs of active bleeding.  Continue to monitor  Goals of care: 2/6: I had long discussion with patient's daughter at the bedside regarding CODE STATUS.  She wishes for DNR and does not want heroic measures/intervention including lumbar puncture at this time.  She wants to keep her mom comfortable as much as possible.  2/7:-Patient's daughter at the bedside and tells me that she cannot see her mom like that and requested to transition to comfort measures. Consulted palliative care.  DVT prophylaxis: Lovenox Code Status: DNR-confirmed with patient's daughter at the bedside Family Communication: Patient's daughter present at bedside.  Plan of care discussed with patient in length and she verbalized understanding and agreed with it. Disposition Plan: To be determined  Consultants:   Neurology  Palliative care  Procedures:   CT head  MRI brain  EEG  Antimicrobials:   None  Status is: Inpatient   Dispo: The patient is from: Home              Anticipated d/c  is to: Home              Anticipated d/c date is: 2 days              Patient currently is not  medically stable to d/c.   Difficult to place patient No   Subjective: Patient seen and examined in ED.  Awake but confused.  Daughter at the bedside tells me that patient had rough last night as she continues to be confused and was trying to get out of bed and calling out her niece.  Daughter tells me that she  cannot see her mom like that-and wishes her mom to be comfortable as much as possible.  Objective: Vitals:   10/11/20 0700 10/11/20 0715 10/11/20 0730 10/11/20 0917  BP: (!) 182/97 (!) 151/106 (!) 144/66 (!) 174/99  Pulse: 99 94 70 96  Resp: 19 (!) 22 18 18   Temp:   98 F (36.7 C) 97.8 F (36.6 C)  TempSrc:   Oral Oral  SpO2: 97% 98% 98% 100%   No intake or output data in the 24 hours ending 10/11/20 0956 There were no vitals filed for this visit.  Examination:  General exam: Appears calm and comfortable, on room air, elderly thin and lean African-American female, hard of hearing, Respiratory system: Clear to auscultation. Respiratory effort normal. Cardiovascular system: S1 & S2 heard, RRR. No JVD, murmurs, rubs, gallops or clicks. No pedal edema. Gastrointestinal system: Abdomen is nondistended, soft and nontender. No organomegaly or masses felt. Normal bowel sounds heard. Central nervous system: Awake but confused-not following commands and not talking  Skin: No rashes, lesions or ulcers   Data Reviewed: I have personally reviewed following labs and imaging studies  CBC: Recent Labs  Lab 10/09/20 0900 10/10/20 0414  WBC 8.1 6.9  HGB 13.4 12.7  HCT 41.7 38.0  MCV 90.7 91.1  PLT 150 132*   Basic Metabolic Panel: Recent Labs  Lab 10/09/20 0900 10/09/20 2210 10/10/20 0414 10/11/20 0230  NA 137  --  138 141  K 3.9  --  4.4 4.4  CL 100  --  103 104  CO2 22  --  25 26  GLUCOSE 148*  --  132* 113*  BUN 39*  --  36* 25*  CREATININE 1.61*  --  1.29* 1.16*  CALCIUM 10.3  --  10.0 10.0  MG  --  1.8  --   --    GFR: Estimated Creatinine Clearance: 33.1  mL/min (A) (by C-G formula based on SCr of 1.16 mg/dL (H)). Liver Function Tests: Recent Labs  Lab 10/09/20 0900 10/11/20 0230  AST 40 42*  ALT 16 15  ALKPHOS 45 43  BILITOT 0.8 0.8  PROT 8.0 7.0  ALBUMIN 4.4 3.6   Recent Labs  Lab 10/09/20 0900  LIPASE 28   No results for input(s): AMMONIA in the last 168 hours. Coagulation Profile: No results for input(s): INR, PROTIME in the last 168 hours. Cardiac Enzymes: No results for input(s): CKTOTAL, CKMB, CKMBINDEX, TROPONINI in the last 168 hours. BNP (last 3 results) No results for input(s): PROBNP in the last 8760 hours. HbA1C: No results for input(s): HGBA1C in the last 72 hours. CBG: Recent Labs  Lab 10/09/20 2209 10/10/20 0540 10/10/20 1205 10/10/20 1817 10/11/20 0758  GLUCAP 118* 123* 79 96 104*   Lipid Profile: No results for input(s): CHOL, HDL, LDLCALC, TRIG, CHOLHDL, LDLDIRECT in the last 72 hours. Thyroid Function Tests: Recent Labs    10/09/20 2210  TSH 1.715   Anemia Panel: No results for input(s): VITAMINB12, FOLATE, FERRITIN, TIBC, IRON, RETICCTPCT in the last 72 hours. Sepsis Labs: Recent Labs  Lab 10/10/20 1415  LATICACIDVEN 1.4    Recent Results (from the past 240 hour(s))  SARS Coronavirus 2 by RT PCR (hospital order, performed in [facility] hospital lab) Nasopharyngeal Nasopharyngeal Swab     Status: None   Collection Time: 10/09/20  5:30 PM   Specimen: Nasopharyngeal Swab  Result Value Ref Range Status   SARS Coronavirus 2 NEGATIVE NEGATIVE Final    Comment: (NOTE) SARS-CoV-2 target nucleic acids are NOT DETECTED.  The SARS-CoV-2 RNA is generally detectable in upper and lower respiratory specimens during the acute phase of infection. The lowest concentration of SARS-CoV-2 viral copies this assay can detect is 250 copies / mL. A negative result does not preclude SARS-CoV-2 infection and should not be used as the sole basis for treatment or other patient management decisions.  A  negative result may occur with improper specimen collection / handling, submission of specimen other than nasopharyngeal swab, presence of viral mutation(s) within the areas targeted by this assay, and inadequate number of viral copies (<250 copies / mL). A negative result must be combined with clinical observations, patient history, and epidemiological information.  Fact Sheet for Patients:   BoilerBrush.com.cy  Fact Sheet for Healthcare Providers: https://pope.com/  This test is not yet approved or  cleared by the [place] FDA and has been authorized for detection and/or diagnosis of SARS-CoV-2 by FDA under an Emergency Use Authorization (EUA).  This EUA will remain in effect (meaning this test can be used) for the duration of the COVID-19 declaration under Section 564(b)(1) of the Act, 21 U.S.C. section 360bbb-3(b)(1), unless the authorization is terminated or revoked sooner.  Performed at [facility], [address], [place], [place] [address]       Radiology Studies: EEG  Result Date: 10/10/2020 Rejeana Brock, MD     10/10/2020 10:01 AM History: 86 yo F with recently suspected seizures and cognitive decline. Sedation: None Technique: This is a 21 channel routine scalp EEG performed at the bedside with bipolar and monopolar montages arranged in accordance to the international 10/20 system of electrode placement. One channel was dedicated to EKG recording. Background: There is a posterior dominant rhythm which is seen bilaterally of 7 to 8 Hz.  In addition, there is a relatively normal appearing beta range activity on the right which is attenuated on the left.  There is diffuse irregular delta activity intruding into the background as well as occasional runs of periodic 1 Hz activity with anteriortemporal/frontotemporal location which at times is sharply contoured and at other times with a smoothly contoured theta wave  appearance.  There is no definite evolution to this pattern, though it does wax and wane in amplitude at times not being visible at all. Photic stimulation: Physiologic driving is not performed EEG Abnormalities: 1) periodic lateralized discharges with variance in morphology, but no definite evolution. 2) focal left hemispheric slow activity 3) generalized irregular slow activity Clinical Interpretation: This EEG recorded a pattern that is on the ictal-interictal continuum, though without features highly concerning for status epilepticus.  This could be representative of postictal state, structural lesion (including CJD, tumor, stroke or other focal pathology), or less likely ongoing ictal activity. Ritta Slot, MD [facility] [phone] If 7pm- 7am, please page neurology on call as listed in AMION.   DG Chest Port 1 View  Result Date:  10/09/2020 CLINICAL DATA:  Cough, weakness, vomiting EXAM: PORTABLE CHEST 1 VIEW COMPARISON:  11/07/2011 FINDINGS: Heart and mediastinal contours are within normal limits. No focal opacities or effusions. No acute bony abnormality. IMPRESSION: No active disease. Electronically Signed   By: Charlett Nose M.D.   On: 10/09/2020 19:28   Overnight EEG with video  Result Date: 10/11/2020 Charlsie Quest, MD     10/11/2020  9:54 AM Patient Name: Julizza Sassone MRN: [id number] Epilepsy Attending: Charlsie Quest Referring Physician/Provider: Dr. Erick Blinks Duration: 10/10/2020 0945 to 10/11/2020 Patient history: 86 year old female with suspected seizures and cognitive decline.  EEG to evaluate for seizures. Level of alertness: Awake, asleep AEDs during EEG study: Keppra Technical aspects: This EEG study was done with scalp electrodes positioned according to the 10-20 International system of electrode placement. Electrical activity was acquired at a sampling rate of 500Hz  and reviewed with a high frequency filter of 70Hz  and a low frequency filter of 1Hz . EEG  data were recorded continuously and digitally stored. Description: The posterior dominant rhythm consists of 8-9 Hz activity of moderate voltage (25-35 uV) seen predominantly in posterior head regions, symmetric and reactive to eye opening and eye closing. Sleep was characterized by vertex waves, sleep spindles (12 to 14 Hz), maximal frontocentral region. EEG showed continuous 3 to 5 Hz theta-delta slowing in left hemisphere as well as intermittent generalized 3 to 5 Hz theta-delta slowing. Periodic discharges with triphasic morphology at 1 to 1.5 Hz were also noted in left hemisphere.  Hyperventilation and photic stimulation were not performed.   Patient event button was pressed on 10/10/2020 at 1025, 1119, 1120, 1130, 1139 for unclear reasons.  Concomitant EEG continued to show periodic discharges with triphasic morphology at 1 to 1.5 Hz in left hemisphere admixed with 3 to 5 Hz theta-delta slowing without definite evolution. ABNORMALITY -Periodic discharges with triphasic morphology, left hemisphere -Continuous slow, left hemisphere - Intermittent slow, generalized IMPRESSION: This study showed periodic discharges with triphasic morphology in left hemisphere at 1 to 1.5 Hz which is on the ictal-interictal continuum.  Additionally there is evidence of cortical dysfunction in left hemisphere as well as mild diffuse encephalopathy, nonspecific etiology.  No definite seizures were seen during the study. Priyanka    Scheduled Meds: . enoxaparin (LOVENOX) injection  30 mg Subcutaneous Q24H  . levETIRAcetam  750 mg Oral BID   Continuous Infusions: . lactated ringers 100 mL/hr at 10/09/20 2241  . levETIRAcetam Stopped (10/11/20 0152)     LOS: 2 days   Time spent: 40 minutes   Keleigh Kazee 12/07/20, MD [facility]  If 7PM-7AM, please contact night-coverage [email or web address] 10/11/2020, 9:56 AM

## 2020-10-11 NOTE — TOC Initial Note (Signed)
Transition of Care [facility]) - Initial/Assessment Note    Patient Details  Name: Jocelyn Hendrix MRN: [id number] Date of Birth: 1935/07/11  Transition of Care [facility]) CM/SW Contact:    Pollie Friar, RN Phone Number: 10/11/2020, 11:42 AM  Clinical Narrative:                 Pts daughter requesting for patient to return home with hospice services. Pt is already active with [facility] and hospice for therapy. CM met with the patient and her daughter. Pts daughter is requesting to stay with [facility] for hospice services. CM has updated Hoyle Sauer with [facility]. CM has also provided her the DME needs for home and they will get the DME ordered for hopeful d/c tomorrow. TOC following.  Expected Discharge Plan: Home w Hospice Care Barriers to Discharge: Continued Medical Work up   Patient Goals and CMS Choice   CMS Medicare.gov Compare Post Acute Care list provided to:: Patient Represenative (must comment) Choice offered to / list presented to : Adult Children  Expected Discharge Plan and Services Expected Discharge Plan: Home w Hospice Care   Discharge Planning Services: CM Consult Post Acute Care Choice: Hospice,Durable Medical Equipment Living arrangements for the past 2 months: Apartment                 DME Arranged: Hospital bed,Overbed table,Wheelchair manual,Suction           [place] Agency: Other - See comment ([place] and hospice)        Prior Living Arrangements/Services Living arrangements for the past 2 months: Apartment Lives with:: Adult Children Patient language and need for interpreter reviewed:: Yes Do you feel safe going back to the place where you live?: Yes      Need for Family Participation in Patient Care: Yes (Comment) Care giver support system in place?: Yes (comment)   Criminal Activity/Legal Involvement Pertinent to Current Situation/Hospitalization: No - Comment as needed  Activities of Daily Living Home Assistive Devices/Equipment: Walker  (specify type) (4 wheel walker) ADL Screening (condition at time of admission) Patient's cognitive ability adequate to safely complete daily activities?: No Is the patient deaf or have difficulty hearing?: No Does the patient have difficulty seeing, even when wearing glasses/contacts?: No Does the patient have difficulty concentrating, remembering, or making decisions?: Yes Patient able to express need for assistance with ADLs?: No Does the patient have difficulty dressing or bathing?: Yes Independently performs ADLs?: No Communication: Needs assistance Is this a change from baseline?: Pre-admission baseline Dressing (OT): Needs assistance Is this a change from baseline?: Pre-admission baseline Grooming: Needs assistance Is this a change from baseline?: Pre-admission baseline Feeding: Needs assistance Is this a change from baseline?: Pre-admission baseline Bathing: Needs assistance Is this a change from baseline?: Pre-admission baseline Toileting: Needs assistance Is this a change from baseline?: Pre-admission baseline In/Out Bed: Needs assistance Is this a change from baseline?: Pre-admission baseline Walks in Home: Needs assistance Is this a change from baseline?: Pre-admission baseline Does the patient have difficulty walking or climbing stairs?: Yes Weakness of Legs: Both Weakness of Arms/Hands: Both  Permission Sought/Granted                  Emotional Assessment           Psych Involvement: No (comment)  Admission diagnosis:  Seizures ([place]) [R56.9] Acute encephalopathy [G93.40] AKI (acute kidney injury) ([place]) [N17.9] Patient Active Problem List   Diagnosis Date Noted  . AKI (acute kidney injury) (  [place]) 10/09/2020  . Seizures ([place]) 10/09/2020  . Acute encephalopathy 10/09/2020  . Diverticulosis 12/12/2018  . History of Guillain-Barre syndrome 06/01/2016  . Diverticulitis 11/06/2013  . Type 2 diabetes mellitus with other specified complication ([place])  12/92/9090  . Hyperlipidemia associated with type 2 diabetes mellitus ([place]) 12/05/2012  . Essential hypertension, benign 12/05/2012   PCP:  Gayland Curry, DO Pharmacy:   [id number] [place]  [id number]: [phone]: [phone] [place] [address] [place] [address]Phone: [phone]: [phone]    Social Determinants of Health (SDOH) Interventions    Readmission Risk Interventions No flowsheet data found.

## 2020-10-12 ENCOUNTER — Telehealth: Payer: Self-pay

## 2020-10-12 NOTE — Telephone Encounter (Signed)
I saw those notes.  I appreciate the update and wish them the very best.  They know how to reach [place] if they need anything.

## 2020-10-12 NOTE — Telephone Encounter (Signed)
Patients daughter Rubin Payor called as a FYI to let Dr.Reed know her mother is currently in the hospital, planning to be discharged home today, and was put on Hospice care.

## 2020-10-12 NOTE — Progress Notes (Signed)
PROGRESS NOTE    Jocelyn Hendrix  LKG:[id number] DOB: [birth year] DOA: 10/09/2020 PCP: Kermit Balo, DO   Brief Narrative:  Patient is 86 year old female with past medical history of type 2 diabetes mellitus, hypertension, hyperlipidemia presents for evaluation of nausea, vomiting, generalized weakness and possible seizures.  As per daughter: Patient functional and independent until about 2 weeks ago-saw neurology as an outpatient couple of days ago-work-up including MRI brain: Negative for acute findings, EEG concern for focal seizure/postictal state, differential diagnosis, hypoperfusion and CJD.  Patient started on Keppra 250 mg twice daily and with plan to titrate up and do 48-hour ambulatory EEG.  This is her third ED visit due to decline in cognition and worsening nausea.  Upon arrival to ED: Patient appears confused, blood pressure elevated at 200/121, other vital signs within normal limits, afebrile, CBC shows no leukocytosis, platelet: 132 cm, TSH: WNL, COVID-19 negative, UA shows trace leukocyte and rare bacteria P shows creatinine of 1.61, GFR of 32-CT head, MRI brain: Negative for acute findings.  Chest x-ray negative for pneumonia.  Neurology consulted and patient started on IV fluids and admitted for further evaluation and management of seizures.  Assessment & Plan:  Recurrent seizures: -CT head, MRI brain: Negative for acute findings. -EEG-findings representative of post ictal state, structural lesion (including CJD, tumor, stroke or other focal pathology) or less likely ongoing ictal activity. -Overnight EEG-showed features that are on ictal-interictal continuum but not concerning for seizure/status epilepticus. -Family refused lumbar puncture & heroic intervention -Patient was given Keppra load of 20 mg/kg IV once in ED- -Keppra dose changed from 500 mg twice daily to 750 mg twice daily -Continue Ativan 1 mg as needed for seizures -On seizure/aspiration precautions  Other  medical problems includes: Acute metabolic encephalopathy AKI Type 2 diabetes mellitus Hypertension Thrombocytopenia  Goals of care: 2/6: I had long discussion with patient's daughter at the bedside regarding CODE STATUS.  She wishes for DNR and does not want heroic measures/intervention including lumbar puncture at this time.  She wants to keep her mom comfortable as much as possible.  2/7:-Discussed with daughter at the bedside-patient transitioned to full comfort care-palliative care consulted-family wishes to take patient home with home hospice services.  TOC is aware-awaiting DME delivery at home-likely tomorrow.  Home hospice has been arranged.  Patient started on comfort care measurement-morphine and Ativan as needed.  DVT prophylaxis: SCD Code Status: DNR-confirmed with patient's daughter at the bedside Family Communication: Patient's daughter present at bedside.  Plan of care discussed with patient in length and she verbalized understanding and agreed with it. Disposition Plan: To be determined  Consultants:   Neurology  Palliative care  Procedures:   CT head  MRI brain  EEG  Antimicrobials:   None  Status is: Inpatient   Dispo: The patient is from: Home              Anticipated d/c is to: Home              Anticipated d/c date is: 2 days              Patient currently is not medically stable to d/c.   Difficult to place patient No   Subjective: Patient seen and examined.  Daughter at the bedside.  No seizure-like activity overnight however patient remains confused.  Remained afebrile.  Daughter is concerned that patient cannot take anything by mouth and requested to change her p.o. medications to IV.   Objective: Vitals:   10/11/20  0730 10/11/20 0917 10/11/20 2306 10/12/20 0802  BP: (!) 144/66 (!) 174/99 (!) 158/80 (!) 171/75  Pulse: 70 96 87 89  Resp: 18 18 17 18   Temp: 98 F (36.7 C) 97.8 F (36.6 C) 98 F (36.7 C) 98.5 F (36.9 C)  TempSrc:  Oral Oral Oral Axillary  SpO2: 98% 100% 99% 100%    Intake/Output Summary (Last 24 hours) at 10/12/2020 1014 Last data filed at 10/12/2020 0600 Gross per 24 hour  Intake 560 ml  Output -  Net 560 ml   There were no vitals filed for this visit.  Examination:  General exam: Appears calm and comfortable, on room air, elderly thin and lean female, Respiratory system: Clear to auscultation. Respiratory effort normal. Cardiovascular system: S1 & S2 heard, RRR. No JVD, murmurs, rubs, gallops or clicks. No pedal edema. Gastrointestinal system: Abdomen is nondistended, soft and nontender. No organomegaly or masses felt. Normal bowel sounds heard. Central nervous system: Sleepy but arousable with verbal command, appears confused-not following commands and not talking.  Skin: No rashes, lesions or ulcers   Data Reviewed: I have personally reviewed following labs and imaging studies  CBC: Recent Labs  Lab 10/09/20 0900 10/10/20 0414  WBC 8.1 6.9  HGB 13.4 12.7  HCT 41.7 38.0  MCV 90.7 91.1  PLT 150 132*   Basic Metabolic Panel: Recent Labs  Lab 10/09/20 0900 10/09/20 2210 10/10/20 0414 10/11/20 0230  NA 137  --  138 141  K 3.9  --  4.4 4.4  CL 100  --  103 104  CO2 22  --  25 26  GLUCOSE 148*  --  132* 113*  BUN 39*  --  36* 25*  CREATININE 1.61*  --  1.29* 1.16*  CALCIUM 10.3  --  10.0 10.0  MG  --  1.8  --   --    GFR: Estimated Creatinine Clearance: 33.1 mL/min (A) (by C-G formula based on SCr of 1.16 mg/dL (H)). Liver Function Tests: Recent Labs  Lab 10/09/20 0900 10/11/20 0230  AST 40 42*  ALT 16 15  ALKPHOS 45 43  BILITOT 0.8 0.8  PROT 8.0 7.0  ALBUMIN 4.4 3.6   Recent Labs  Lab 10/09/20 0900  LIPASE 28   Recent Labs  Lab 10/11/20 1109  AMMONIA 11   Coagulation Profile: No results for input(s): INR, PROTIME in the last 168 hours. Cardiac Enzymes: No results for input(s): CKTOTAL, CKMB, CKMBINDEX, TROPONINI in the last 168 hours. BNP (last 3  results) No results for input(s): PROBNP in the last 8760 hours. HbA1C: No results for input(s): HGBA1C in the last 72 hours. CBG: Recent Labs  Lab 10/09/20 2209 10/10/20 0540 10/10/20 1205 10/10/20 1817 10/11/20 0758  GLUCAP 118* 123* 79 96 104*   Lipid Profile: No results for input(s): CHOL, HDL, LDLCALC, TRIG, CHOLHDL, LDLDIRECT in the last 72 hours. Thyroid Function Tests: Recent Labs    10/09/20 2210  TSH 1.715   Anemia Panel: Recent Labs    10/11/20 1109  VITAMINB12 1,013*  FOLATE 13.1   Sepsis Labs: Recent Labs  Lab 10/10/20 1415  LATICACIDVEN 1.4    Recent Results (from the past 240 hour(s))  SARS Coronavirus 2 by RT PCR (hospital order, performed in [facility] hospital lab) Nasopharyngeal Nasopharyngeal Swab     Status: None   Collection Time: 10/09/20  5:30 PM   Specimen: Nasopharyngeal Swab  Result Value Ref Range Status   SARS Coronavirus 2 NEGATIVE NEGATIVE Final  Comment: (NOTE) SARS-CoV-2 target nucleic acids are NOT DETECTED.  The SARS-CoV-2 RNA is generally detectable in upper and lower respiratory specimens during the acute phase of infection. The lowest concentration of SARS-CoV-2 viral copies this assay can detect is 250 copies / mL. A negative result does not preclude SARS-CoV-2 infection and should not be used as the sole basis for treatment or other patient management decisions.  A negative result may occur with improper specimen collection / handling, submission of specimen other than nasopharyngeal swab, presence of viral mutation(s) within the areas targeted by this assay, and inadequate number of viral copies (<250 copies / mL). A negative result must be combined with clinical observations, patient history, and epidemiological information.  Fact Sheet for Patients:   BoilerBrush.com.cy  Fact Sheet for Healthcare Providers: https://pope.com/  This test is not yet approved or   cleared by the [place] FDA and has been authorized for detection and/or diagnosis of SARS-CoV-2 by FDA under an Emergency Use Authorization (EUA).  This EUA will remain in effect (meaning this test can be used) for the duration of the COVID-19 declaration under Section 564(b)(1) of the Act, 21 U.S.C. section 360bbb-3(b)(1), unless the authorization is terminated or revoked sooner.  Performed at [facility], [address], [place], [place] [address]       Radiology Studies: Overnight EEG with video  Result Date: 10/11/2020 Charlsie Quest, MD     10/11/2020 10:01 AM Patient Name: Jocelyn Hendrix MRN: [id number] Epilepsy Attending: Charlsie Quest Referring Physician/Provider: Dr. Erick Blinks Duration: 10/10/2020 0945 to 10/11/2020 0840 Patient history: 86 year old female with suspected seizures and cognitive decline.  EEG to evaluate for seizures. Level of alertness: Awake, asleep AEDs during EEG study: Keppra Technical aspects: This EEG study was done with scalp electrodes positioned according to the 10-20 International system of electrode placement. Electrical activity was acquired at a sampling rate of 500Hz  and reviewed with a high frequency filter of 70Hz  and a low frequency filter of 1Hz . EEG data were recorded continuously and digitally stored. Description: The posterior dominant rhythm consists of 8-9 Hz activity of moderate voltage (25-35 uV) seen predominantly in posterior head regions, symmetric and reactive to eye opening and eye closing. Sleep was characterized by vertex waves, sleep spindles (12 to 14 Hz), maximal frontocentral region. EEG showed continuous 3 to 5 Hz theta-delta slowing in left hemisphere as well as intermittent generalized 3 to 5 Hz theta-delta slowing. Periodic discharges with triphasic morphology at 1 to 1.5 Hz were also noted in left hemisphere.  Hyperventilation and photic stimulation were not performed.   Patient event button was pressed on 10/10/2020  at 1025, 1119, 1120, 1130, 1139 for unclear reasons.  Concomitant EEG continued to show periodic discharges with triphasic morphology at 1 to 1.5 Hz in left hemisphere admixed with 3 to 5 Hz theta-delta slowing without definite evolution. ABNORMALITY -Periodic discharges with triphasic morphology, left hemisphere -Continuous slow, left hemisphere - Intermittent slow, generalized IMPRESSION: This study showed periodic discharges with triphasic morphology in left hemisphere at 1 to 1.5 Hz which is on the ictal-interictal continuum.  Additionally there is evidence of cortical dysfunction in left hemisphere as well as mild diffuse encephalopathy, nonspecific etiology.  No definite seizures were seen during the study. Priyanka    Scheduled Meds: . levETIRAcetam  750 mg Oral BID   Continuous Infusions: . lactated ringers 50 mL/hr at 10/11/20 1148  . levETIRAcetam 750 mg (10/12/20 0939)     LOS: 3  days   Time spent: 40 minutes   Embrie Mikkelsen Estill Cotta, MD [facility]  If 7PM-7AM, please contact night-coverage [email or web address] 10/12/2020, 10:14 AM

## 2020-10-12 NOTE — Plan of Care (Signed)
  Problem: Clinical Measurements: Goal: Will remain free from infection Outcome: Progressing   Problem: Clinical Measurements: Goal: Ability to maintain clinical measurements within normal limits will improve Outcome: Progressing   Problem: Health Behavior/Discharge Planning: Goal: Ability to manage health-related needs will improve Outcome: Progressing   

## 2020-10-13 MED ORDER — LEVETIRACETAM 750 MG PO TABS: 750.0000 mg | ORAL_TABLET | Freq: Two times a day (BID) | ORAL | 0 refills | Status: AC

## 2020-10-13 MED ORDER — MORPHINE SULFATE (CONCENTRATE) 10 MG/0.5ML PO SOLN: 5.0000 mg | ORAL | 0 refills | Status: AC | PRN

## 2020-10-13 MED ORDER — ONDANSETRON HCL 4 MG PO TABS: 4.0000 mg | ORAL_TABLET | Freq: Four times a day (QID) | ORAL | 0 refills | Status: AC | PRN

## 2020-10-13 MED ORDER — ATROPINE SULFATE 1 % OP SOLN: 2.0000 [drp] | Freq: Four times a day (QID) | OPHTHALMIC | 12 refills | Status: AC | PRN

## 2020-10-13 NOTE — Care Management Important Message (Signed)
Important Message  Patient Details  Name: Jocelyn Hendrix MRN: [id number] Date of Birth: 10-May-1935   Medicare Important Message Given:  Yes Patient left prior to IM delivery .  IM mailed the the patient home address.    Graciela Plato 10/13/2020, 3:17 PM

## 2020-10-13 NOTE — Plan of Care (Signed)

## 2020-10-13 NOTE — Discharge Summary (Signed)
Physician Discharge Summary  Jocelyn Hendrix GNF:[id number] DOB: [birth year] DOA: 10/09/2020  PCP: Kermit Balo, DO  Admit date: 10/09/2020 Discharge date: 10/13/2020  Admitted From: Home.  Disposition:  Home hospice.   Recommendations for Outpatient Follow-up:  Follow up with hospice MD as recommended.   Discharge Condition:Hospice.  CODE STATUS: Comfort Care Diet recommendation: comfort feeds.   Brief/Interim Summary: Patient is 86 year old female with past medical history of type 2 diabetes mellitus, hypertension, hyperlipidemia presents for evaluation of nausea, vomiting, generalized weakness and possible seizures.  As per daughter: Patient functional and independent until about 2 weeks ago-saw neurology as an outpatient couple of days ago-work-up including MRI brain: Negative for acute findings, EEG concern for focal seizure/postictal state, differential diagnosis, hypoperfusion and CJD.  Patient started on Keppra 250 mg twice daily and with plan to titrate up and do 48-hour ambulatory EEG.  This is her third ED visit due to decline in cognition and worsening nausea.  Upon arrival to ED: Patient appears confused, blood pressure elevated at 200/121, other vital signs within normal limits, afebrile, CBC shows no leukocytosis, platelet: 132 cm, TSH: WNL, COVID-19 negative, UA shows trace leukocyte and rare bacteria P shows creatinine of 1.61, GFR of 32-CT head, MRI brain: Negative for acute findings.  Chest x-ray negative for pneumonia.  Neurology consulted and patient started on IV fluids and admitted for further evaluation and management of seizures.   Discharge Diagnoses:  Principal Problem:   Seizures (HCC) Active Problems:   Type 2 diabetes mellitus with other specified complication (HCC)   Essential hypertension, benign   AKI (acute kidney injury) (HCC)   Acute encephalopathy   Palliative care by specialist   Goals of care, counseling/discussion   Recurrent seizures: -CT  head, MRI brain: Negative for acute findings. -EEG-findings representative of post ictal state, structural lesion (including CJD, tumor, stroke or other focal pathology) or less likely ongoing ictal activity. -Overnight EEG-showed features that are on ictal-interictal continuum but not concerning for seizure/status epilepticus. -Family refused lumbar puncture & heroic intervention -Patient was given Keppra load of 20 mg/kg IV once in ED- -Keppra dose changed from 500 mg twice daily to 750 mg twice daily -On seizure/aspiration precautions  Other medical problems includes: Acute metabolic encephalopathy AKI Type 2 diabetes mellitus Hypertension Thrombocytopenia  Goals of care: 2/6:  Long discussion with patient's daughter at the bedside regarding CODE STATUS.  She wishes for DNR and does not want heroic measures/intervention including lumbar puncture at this time.  She wants to keep her mom comfortable as much as possible.  2/7:-Discussed with daughter at the bedside-patient transitioned to full comfort care-palliative care consulted-family wishes to take patient home with home hospice services.  TOC is aware-awaiting DME delivery at home-likely today.  Home hospice has been arranged.  Patient started on comfort care measurement-morphine and Ativan as needed.  Discharge Instructions  Discharge Instructions    Diet - low sodium heart healthy   Complete by: As directed    Discharge instructions   Complete by: As directed    Follow up with hospice MD as recommended.     Allergies as of 10/13/2020   No Known Allergies     Medication List    STOP taking these medications   Accu-Chek Guide test strip Generic drug: glucose blood   accu-chek soft touch lancets   aspirin 81 MG tablet   lisinopril 40 MG tablet Commonly known as: ZESTRIL   metFORMIN 500 MG tablet Commonly known as: GLUCOPHAGE  TAKE these medications   atropine 1 % ophthalmic solution Place 2 drops under  the tongue 4 (four) times daily as needed (secretions).   Cranberry 475 MG Caps Take 1 capsule (475 mg total) by mouth 2 (two) times daily.   levETIRAcetam 750 MG tablet Commonly known as: KEPPRA Take 1 tablet (750 mg total) by mouth 2 (two) times daily. What changed:   medication strength  how much to take   morphine CONCENTRATE 10 MG/0.5ML Soln concentrated solution Place 0.25-0.5 mLs (5-10 mg total) under the tongue every 3 (three) hours as needed for severe pain (dyspnea/air hunger/tachypnea).   ondansetron 4 MG tablet Commonly known as: ZOFRAN Take 1 tablet (4 mg total) by mouth every 6 (six) hours as needed for nausea.       Follow-up Information    Reed, Tiffany L, DO. Schedule an appointment as soon as possible for a visit in 1 week(s).   Specialty: Geriatric Medicine Contact information: [address] [place] [address] [phone]              No Known Allergies  Consultations:     Procedures/Studies: EEG  Result Date: 10/10/2020 Rejeana Brock, MD     10/10/2020 10:01 AM History: 86 yo F with recently suspected seizures and cognitive decline. Sedation: None Technique: This is a 21 channel routine scalp EEG performed at the bedside with bipolar and monopolar montages arranged in accordance to the international 10/20 system of electrode placement. One channel was dedicated to EKG recording. Background: There is a posterior dominant rhythm which is seen bilaterally of 7 to 8 Hz.  In addition, there is a relatively normal appearing beta range activity on the right which is attenuated on the left.  There is diffuse irregular delta activity intruding into the background as well as occasional runs of periodic 1 Hz activity with anteriortemporal/frontotemporal location which at times is sharply contoured and at other times with a smoothly contoured theta wave appearance.  There is no definite evolution to this pattern, though it does wax and wane in  amplitude at times not being visible at all. Photic stimulation: Physiologic driving is not performed EEG Abnormalities: 1) periodic lateralized discharges with variance in morphology, but no definite evolution. 2) focal left hemispheric slow activity 3) generalized irregular slow activity Clinical Interpretation: This EEG recorded a pattern that is on the ictal-interictal continuum, though without features highly concerning for status epilepticus.  This could be representative of postictal state, structural lesion (including CJD, tumor, stroke or other focal pathology), or less likely ongoing ictal activity. Ritta Slot, MD [facility] [phone] If 7pm- 7am, please page neurology on call as listed in AMION.   EEG  Result Date: 10/04/2020 Vladimir Faster, DO     10/04/2020  4:11 PM TECHNICAL SUMMARY:  A multichannel referential and bipolar montage EEG using the standard international 10-20 system was performed on the patient described as awake and drowsy.  The dominant background activity consists of 7-8 hertz activity seen most prominantly over the posterior head region.  3 to 5 Hz diffuse slowing is seen throughout all head regions, frontally predominant, left much more so than right.  Sharply contoured theta is noted, primarily in the left hemisphere compared to that of the right. ACTIVATION:  Stepwise photic stimulation at 4-20 flashes per second was performed and did not elicit any epileptiform activity. EPILEPTIFORM ACTIVITY:  There was no clear epileptiform activity, but as above there was sharply contoured theta in the left hemisphere  compared to that of the right. SLEEP: Physiologic drowsiness is noted. IMPRESSION: This is an abnormal EEG demonstrating: 1.  Diffuse slowing of electrocerebral activity. 2.  Slowing and sharply contoured theta in the left hemisphere compared to that of the right.  A structural lesion in the left hemisphere should be ruled out.   CT HEAD WO  CONTRAST  Result Date: 10/04/2020 CLINICAL DATA:  Right-sided weakness, acute neurologic deficit EXAM: CT HEAD WITHOUT CONTRAST TECHNIQUE: Contiguous axial images were obtained from the base of the skull through the vertex without intravenous contrast. COMPARISON:  MRI 06/16/2017 FINDINGS: Brain: Normal anatomic configuration. Parenchymal volume loss is commensurate with the patient's age. Mild periventricular white matter changes are present likely reflecting the sequela of small vessel ischemia. No abnormal intra or extra-axial mass lesion or fluid collection. No abnormal mass effect or midline shift. No evidence of acute intracranial hemorrhage or infarct. Ventricular size is normal. Cerebellum unremarkable. Vascular: No asymmetric hyperdense vasculature at the skull base. Skull: Intact Sinuses/Orbits: Paranasal sinuses are clear. Orbits are unremarkable. Other: Mastoid air cells and middle ear cavities are clear. IMPRESSION: Mild senescent change. No evidence of acute intracranial hemorrhage or infarct. Electronically Signed   By: Helyn Numbers MD   On: 10/04/2020 14:31   MR Brain Wo Contrast  Result Date: 10/05/2020 CLINICAL DATA:  Right hand weakness and tremor.  Stroke suspected. EXAM: MRI HEAD WITHOUT CONTRAST TECHNIQUE: Multiplanar, multiecho pulse sequences of the brain and surrounding structures were obtained without intravenous contrast. COMPARISON:  Head CT yesterday.  MRI 06/16/2017. FINDINGS: Brain: Question small focus of restricted diffusion versus artifact within the ventral midline upper pons just below the mid brain. Artifact is favored as I cannot confirm this on the coronal diffusion imaging. Background pattern of chronic small vessel disease affecting the cerebral hemispheric deep and subcortical white matter is progressive since 2018. Today's study shows a pattern of restricted diffusion affecting the left caudate and the cortex of the left hemisphere, most notable in the ACA  distribution. This does not seem to be artifactual. I think the differential diagnosis is postictal phenomenon versus is sequela hypoperfusion versus is the rare possibility of prion disease. Elsewhere, no mass, hemorrhage, hydrocephalus or extra-axial collection. Vascular: Major vessels at the base of the brain show flow. Skull and upper cervical spine: Negative Sinuses/Orbits: Clear/normal Other: None IMPRESSION: 1. Background pattern of chronic small vessel disease affecting the cerebral hemispheric white matter, progressive since 2018. 2. Restricted diffusion affecting the left caudate and the cortex of the left hemisphere, most notable in the ACA distribution. This does not seem to be artifactual. I think the differential diagnosis is postictal phenomenon versus hypoperfusion versus is the rare possibility of prion disease such as C-J disease. 3. Question small focus of restricted diffusion versus artifact within the ventral midline upper pons just below the mid brain. This could be brainstem small vessel infarction. However, I favor that it is artifactual. Electronically Signed   By: Paulina Fusi M.D.   On: 10/05/2020 07:50   MR BRAIN W CONTRAST  Result Date: 10/06/2020 CLINICAL DATA:  Abnormal MRI brain EXAM: MRI HEAD WITH CONTRAST TECHNIQUE: Multiplanar, multiecho pulse sequences of the brain and surrounding structures were obtained with intravenous contrast. CONTRAST:  60mL MULTIHANCE GADOBENATE DIMEGLUMINE 529 MG/ML IV SOLN COMPARISON:  10/05/2020 FINDINGS: Brain: There is no abnormal enhancement. Stable findings of probable chronic microvascular ischemic changes. No hydrocephalus. Vascular: Negative. Skull and upper cervical spine: Normal marrow signal. Sinuses/Orbits: No acute abnormality. Other: None. IMPRESSION:  No abnormal enhancement. Electronically Signed   By: Guadlupe Spanish M.D.   On: 10/06/2020 10:26   DG Chest Port 1 View  Result Date: 10/09/2020 CLINICAL DATA:  Cough, weakness, vomiting  EXAM: PORTABLE CHEST 1 VIEW COMPARISON:  11/07/2011 FINDINGS: Heart and mediastinal contours are within normal limits. No focal opacities or effusions. No acute bony abnormality. IMPRESSION: No active disease. Electronically Signed   By: Charlett Nose M.D.   On: 10/09/2020 19:28   Overnight EEG with video  Result Date: 10/11/2020 Charlsie Quest, MD     10/11/2020 10:01 AM Patient Name: Lorri Fukuhara MRN: [id number] Epilepsy Attending: Charlsie Quest Referring Physician/Provider: Dr. Erick Blinks Duration: 10/10/2020 0945 to 10/11/2020 0840 Patient history: 86 year old female with suspected seizures and cognitive decline.  EEG to evaluate for seizures. Level of alertness: Awake, asleep AEDs during EEG study: Keppra Technical aspects: This EEG study was done with scalp electrodes positioned according to the 10-20 International system of electrode placement. Electrical activity was acquired at a sampling rate of 500Hz  and reviewed with a high frequency filter of 70Hz  and a low frequency filter of 1Hz . EEG data were recorded continuously and digitally stored. Description: The posterior dominant rhythm consists of 8-9 Hz activity of moderate voltage (25-35 uV) seen predominantly in posterior head regions, symmetric and reactive to eye opening and eye closing. Sleep was characterized by vertex waves, sleep spindles (12 to 14 Hz), maximal frontocentral region. EEG showed continuous 3 to 5 Hz theta-delta slowing in left hemisphere as well as intermittent generalized 3 to 5 Hz theta-delta slowing. Periodic discharges with triphasic morphology at 1 to 1.5 Hz were also noted in left hemisphere.  Hyperventilation and photic stimulation were not performed.   Patient event button was pressed on 10/10/2020 at 1025, 1119, 1120, 1130, 1139 for unclear reasons.  Concomitant EEG continued to show periodic discharges with triphasic morphology at 1 to 1.5 Hz in left hemisphere admixed with 3 to 5 Hz theta-delta slowing without  definite evolution. ABNORMALITY -Periodic discharges with triphasic morphology, left hemisphere -Continuous slow, left hemisphere - Intermittent slow, generalized IMPRESSION: This study showed periodic discharges with triphasic morphology in left hemisphere at 1 to 1.5 Hz which is on the ictal-interictal continuum.  Additionally there is evidence of cortical dysfunction in left hemisphere as well as mild diffuse encephalopathy, nonspecific etiology.  No definite seizures were seen during the study. Priyanka     Subjective:  Does not appear to be in distress.  Discharge Exam: Vitals:   10/13/20 0812 10/13/20 0904  BP: (!) 199/73 (!) 179/82  Pulse: 79   Resp: 18   Temp: 97.9 F (36.6 C)   SpO2: 100%    Vitals:   10/12/20 0802 10/12/20 2020 10/13/20 0812 10/13/20 0904  BP: (!) 171/75 (!) 195/93 (!) 199/73 (!) 179/82  Pulse: 89 97 79   Resp: 18 16 18    Temp: 98.5 F (36.9 C) 98.3 F (36.8 C) 97.9 F (36.6 C)   TempSrc: Axillary Axillary Axillary   SpO2: 100% 99% 100%     General: Pt is alert, awake, not in acute distress     The results of significant diagnostics from this hospitalization (including imaging, microbiology, ancillary and laboratory) are listed below for reference.     Microbiology: Recent Results (from the past 240 hour(s))  SARS Coronavirus 2 by RT PCR (hospital order, performed in [facility] hospital lab) Nasopharyngeal Nasopharyngeal Swab     Status: None   Collection Time: 10/09/20  5:30 PM   Specimen: Nasopharyngeal Swab  Result Value Ref Range Status   SARS Coronavirus 2 NEGATIVE NEGATIVE Final    Comment: (NOTE) SARS-CoV-2 target nucleic acids are NOT DETECTED.  The SARS-CoV-2 RNA is generally detectable in upper and lower respiratory specimens during the acute phase of infection. The lowest concentration of SARS-CoV-2 viral copies this assay can detect is 250 copies / mL. A negative result does not preclude SARS-CoV-2 infection and  should not be used as the sole basis for treatment or other patient management decisions.  A negative result may occur with improper specimen collection / handling, submission of specimen other than nasopharyngeal swab, presence of viral mutation(s) within the areas targeted by this assay, and inadequate number of viral copies (<250 copies / mL). A negative result must be combined with clinical observations, patient history, and epidemiological information.  Fact Sheet for Patients:   BoilerBrush.com.cy  Fact Sheet for Healthcare Providers: https://pope.com/  This test is not yet approved or  cleared by the [place] FDA and has been authorized for detection and/or diagnosis of SARS-CoV-2 by FDA under an Emergency Use Authorization (EUA).  This EUA will remain in effect (meaning this test can be used) for the duration of the COVID-19 declaration under Section 564(b)(1) of the Act, 21 U.S.C. section 360bbb-3(b)(1), unless the authorization is terminated or revoked sooner.  Performed at [facility], [address], [place], [place] [address]      Labs: BNP (last 3 results) No results for input(s): BNP in the last 8760 hours. Basic Metabolic Panel: Recent Labs  Lab 10/09/20 0900 10/09/20 2210 10/10/20 0414 10/11/20 0230  NA 137  --  138 141  K 3.9  --  4.4 4.4  CL 100  --  103 104  CO2 22  --  25 26  GLUCOSE 148*  --  132* 113*  BUN 39*  --  36* 25*  CREATININE 1.61*  --  1.29* 1.16*  CALCIUM 10.3  --  10.0 10.0  MG  --  1.8  --   --    Liver Function Tests: Recent Labs  Lab 10/09/20 0900 10/11/20 0230  AST 40 42*  ALT 16 15  ALKPHOS 45 43  BILITOT 0.8 0.8  PROT 8.0 7.0  ALBUMIN 4.4 3.6   Recent Labs  Lab 10/09/20 0900  LIPASE 28   Recent Labs  Lab 10/11/20 1109  AMMONIA 11   CBC: Recent Labs  Lab 10/09/20 0900 10/10/20 0414  WBC 8.1 6.9  HGB 13.4 12.7  HCT 41.7 38.0  MCV 90.7 91.1   PLT 150 132*   Cardiac Enzymes: No results for input(s): CKTOTAL, CKMB, CKMBINDEX, TROPONINI in the last 168 hours. BNP: Invalid input(s): POCBNP CBG: Recent Labs  Lab 10/09/20 2209 10/10/20 0540 10/10/20 1205 10/10/20 1817 10/11/20 0758  GLUCAP 118* 123* 79 96 104*   D-Dimer No results for input(s): DDIMER in the last 72 hours. Hgb A1c No results for input(s): HGBA1C in the last 72 hours. Lipid Profile No results for input(s): CHOL, HDL, LDLCALC, TRIG, CHOLHDL, LDLDIRECT in the last 72 hours. Thyroid function studies No results for input(s): TSH, T4TOTAL, T3FREE, THYROIDAB in the last 72 hours.  Invalid input(s): FREET3 Anemia work up Recent Labs    10/11/20 1109  VITAMINB12 1,013*  FOLATE 13.1   Urinalysis    Component Value Date/Time   COLORURINE AMBER (A) 10/09/2020 1227   APPEARANCEUR HAZY (A) 10/09/2020 1227   LABSPEC 1.026 10/09/2020 1227  PHURINE 5.0 10/09/2020 1227   GLUCOSEU NEGATIVE 10/09/2020 1227   HGBUR NEGATIVE 10/09/2020 1227   BILIRUBINUR NEGATIVE 10/09/2020 1227   BILIRUBINUR Negative 09/22/2020 1142   KETONESUR 20 (A) 10/09/2020 1227   PROTEINUR 30 (A) 10/09/2020 1227   UROBILINOGEN negative (A) 09/22/2020 1142   UROBILINOGEN 1.0 08/01/2013 1656   NITRITE NEGATIVE 10/09/2020 1227   LEUKOCYTESUR TRACE (A) 10/09/2020 1227   Sepsis Labs Invalid input(s): PROCALCITONIN,  WBC,  LACTICIDVEN Microbiology Recent Results (from the past 240 hour(s))  SARS Coronavirus 2 by RT PCR (hospital order, performed in [facility] lab) Nasopharyngeal Nasopharyngeal Swab     Status: None   Collection Time: 10/09/20  5:30 PM   Specimen: Nasopharyngeal Swab  Result Value Ref Range Status   SARS Coronavirus 2 NEGATIVE NEGATIVE Final    Comment: (NOTE) SARS-CoV-2 target nucleic acids are NOT DETECTED.  The SARS-CoV-2 RNA is generally detectable in upper and lower respiratory specimens during the acute phase of infection. The lowest concentration  of SARS-CoV-2 viral copies this assay can detect is 250 copies / mL. A negative result does not preclude SARS-CoV-2 infection and should not be used as the sole basis for treatment or other patient management decisions.  A negative result may occur with improper specimen collection / handling, submission of specimen other than nasopharyngeal swab, presence of viral mutation(s) within the areas targeted by this assay, and inadequate number of viral copies (<250 copies / mL). A negative result must be combined with clinical observations, patient history, and epidemiological information.  Fact Sheet for Patients:   BoilerBrush.com.cyhttps://www.fda.gov/media/136312/download  Fact Sheet for Healthcare Providers: https://pope.com/https://www.fda.gov/media/136313/download  This test is not yet approved or  cleared by the [place] FDA and has been authorized for detection and/or diagnosis of SARS-CoV-2 by FDA under an Emergency Use Authorization (EUA).  This EUA will remain in effect (meaning this test can be used) for the duration of the COVID-19 declaration under Section 564(b)(1) of the Act, 21 U.S.C. section 360bbb-3(b)(1), unless the authorization is terminated or revoked sooner.  Performed at [facility], [address] [place], [place] [id number]      Time coordinating discharge: 32 minutes.  SIGNED:   Kathlen ModyVijaya Radley Teston, MD  [facility]

## 2020-10-13 NOTE — TOC Transition Note (Signed)
Transition of Care [facility]) - CM/SW Discharge Note   Patient Details  Name: Gali Spinney MRN: [id number] Date of Birth: 02/21/1935  Transition of Care [facility]) CM/SW Contact:  Kermit Balo, RN Phone Number: 10/13/2020, 12:35 PM   Clinical Narrative:    Pt is discharging home with hospice services to start through [facility] and hospice. Eber Jones with [facility] aware of d/c. CM spoke to patients daughter: Rubin Payor and she states the DME has been delivered to the home. PTAR arranged for transport home.   Final next level of care: Home w Hospice Care Barriers to Discharge: No Barriers Identified   Patient Goals and CMS Choice   CMS Medicare.gov Compare Post Acute Care list provided to:: Patient Represenative (must comment) Choice offered to / list presented to : Adult Children  Discharge Placement                       Discharge Plan and Services   Discharge Planning Services: CM Consult Post Acute Care Choice: Hospice,Durable Medical Equipment          DME Arranged: Hospital bed,Overbed table,Wheelchair manual,Suction           [facility] Agency: Other - See comment [facility] and hospice)        Social Determinants of Health (SDOH) Interventions     Readmission Risk Interventions No flowsheet data found.

## 2020-10-13 NOTE — Progress Notes (Signed)
Discharged to home via [facility] on stretcher.  Notified daughter Rubin Payor.  IV and purwick removed.  Cleaned by tech before transporting.

## 2020-10-13 NOTE — Progress Notes (Signed)
IV site to ac beeping on and off, immobilizer applied but site kept beeping. As pt was not able to keep arm straight  Noted the angio cath on the rt ac was kinked. IV stopped,   a consult made to  IV team and new IV placed to left forearm which worked perfectly well trough out the shift.

## 2020-10-13 NOTE — Progress Notes (Signed)
Patient leak at bit of po med in apple sauce and refused most of it. Daughter at bed side said will try and give it but pt refused.  This RN attempted doing mouth care with medline sponge pt cringed on the sponge and nurse trie,d to get it out. Pt daughter came around and said  to leave pt  Alone. Nurse removed the sponge and left pt comfortable in bed.

## 2020-10-14 ENCOUNTER — Telehealth: Payer: Self-pay

## 2020-10-14 NOTE — Telephone Encounter (Signed)
Transition Care Management Follow-up Telephone Call  Date of discharge and from where: 10/13/2020 from [facility]  How have you been since you were released from the hospital? Spoke to pt dtr Rubin Payor and states that pt is resting and currently did not have any questions.   Any questions or concerns? No  Items Reviewed:  Did the pt receive and understand the discharge instructions provided? Yes   Medications obtained and verified? Yes   Other? No   Any new allergies since your discharge? No   Dietary orders reviewed? N/A  Do you have support at home? Yes    Functional Questionnaire: (I = Independent and D = Dependent) Pt has been placed under Hospice Care

## 2020-10-18 ENCOUNTER — Ambulatory Visit: Payer: Medicare Other | Admitting: Internal Medicine

## 2020-10-22 ENCOUNTER — Other Ambulatory Visit: Payer: Self-pay | Admitting: Internal Medicine

## 2020-10-25 ENCOUNTER — Encounter: Payer: Self-pay | Admitting: Internal Medicine

## 2020-11-02 DEATH — deceased

## 2021-04-01 ENCOUNTER — Encounter: Payer: Medicare Other | Admitting: Family
